# Patient Record
Sex: Male | Born: 1955 | Race: Black or African American | Hispanic: No | Marital: Married | Smoking: Former smoker
Health system: Southern US, Community
[De-identification: ages and names within clinical notes are randomized; demographics above are authoritative.]

## PROBLEM LIST (undated history)

## (undated) DIAGNOSIS — R319 Hematuria, unspecified: Secondary | ICD-10-CM

## (undated) DIAGNOSIS — Z720 Tobacco use: Secondary | ICD-10-CM

## (undated) DIAGNOSIS — I5022 Chronic systolic (congestive) heart failure: Secondary | ICD-10-CM

## (undated) DIAGNOSIS — E119 Type 2 diabetes mellitus without complications: Secondary | ICD-10-CM

## (undated) DIAGNOSIS — I2510 Atherosclerotic heart disease of native coronary artery without angina pectoris: Secondary | ICD-10-CM

## (undated) DIAGNOSIS — M259 Joint disorder, unspecified: Secondary | ICD-10-CM

## (undated) DIAGNOSIS — Z95810 Presence of automatic (implantable) cardiac defibrillator: Secondary | ICD-10-CM

## (undated) DIAGNOSIS — D696 Thrombocytopenia, unspecified: Secondary | ICD-10-CM

## (undated) DIAGNOSIS — N183 Chronic kidney disease, stage 3 unspecified: Secondary | ICD-10-CM

## (undated) DIAGNOSIS — M1990 Unspecified osteoarthritis, unspecified site: Secondary | ICD-10-CM

## (undated) DIAGNOSIS — E785 Hyperlipidemia, unspecified: Secondary | ICD-10-CM

## (undated) DIAGNOSIS — I447 Left bundle-branch block, unspecified: Secondary | ICD-10-CM

## (undated) DIAGNOSIS — I10 Essential (primary) hypertension: Secondary | ICD-10-CM

## (undated) DIAGNOSIS — I509 Heart failure, unspecified: Secondary | ICD-10-CM

## (undated) DIAGNOSIS — I639 Cerebral infarction, unspecified: Secondary | ICD-10-CM

## (undated) HISTORY — DX: Chronic kidney disease, stage 3 unspecified: N18.30

## (undated) HISTORY — PX: NO PAST SURGERIES: SHX2092

## (undated) HISTORY — DX: Atherosclerotic heart disease of native coronary artery without angina pectoris: I25.10

## (undated) HISTORY — DX: Chronic systolic (congestive) heart failure: I50.22

## (undated) HISTORY — DX: Left bundle-branch block, unspecified: I44.7

## (undated) HISTORY — DX: Cerebral infarction, unspecified: I63.9

## (undated) HISTORY — DX: Thrombocytopenia, unspecified: D69.6

---

## 2005-09-28 ENCOUNTER — Inpatient Hospital Stay (HOSPITAL_COMMUNITY): Admission: EM | Admit: 2005-09-28 | Discharge: 2005-10-01 | Payer: Self-pay | Admitting: Emergency Medicine

## 2006-03-05 ENCOUNTER — Emergency Department (HOSPITAL_COMMUNITY): Admission: EM | Admit: 2006-03-05 | Discharge: 2006-03-05 | Payer: Self-pay | Admitting: Emergency Medicine

## 2009-01-07 ENCOUNTER — Emergency Department (HOSPITAL_COMMUNITY): Admission: EM | Admit: 2009-01-07 | Discharge: 2009-01-07 | Payer: Self-pay | Admitting: Emergency Medicine

## 2009-08-25 ENCOUNTER — Ambulatory Visit: Payer: Self-pay | Admitting: Internal Medicine

## 2009-08-25 ENCOUNTER — Inpatient Hospital Stay (HOSPITAL_COMMUNITY): Admission: EM | Admit: 2009-08-25 | Discharge: 2009-08-28 | Payer: Self-pay | Admitting: Emergency Medicine

## 2009-08-26 ENCOUNTER — Encounter (INDEPENDENT_AMBULATORY_CARE_PROVIDER_SITE_OTHER): Payer: Self-pay | Admitting: Internal Medicine

## 2009-09-18 ENCOUNTER — Encounter (INDEPENDENT_AMBULATORY_CARE_PROVIDER_SITE_OTHER): Payer: Self-pay | Admitting: *Deleted

## 2010-04-22 NOTE — Letter (Signed)
Summary: Appointment - Missed  [facility], Main Office  [address]   [place], [place] [address]   Phone: [phone]  Fax: [phone]     September 18, 2009 MRN: [id number]   Steven Brady [address] [place], [place]  [address]   Dear [name],  Our records indicate you need to schedule a post hospital appointment and a myoview test as soon as possible with Dr. Excell Seltzer.  It is very important that we reach you to schedule this appointment. We look forward to participating in your health care needs. Please contact us at the number listed above at your earliest convenience to reschedule this appointment.     Sincerely, Neurosurgeon Team LG

## 2010-06-09 LAB — COMPREHENSIVE METABOLIC PANEL
ALT: 22 U/L (ref 0–53)
AST: 21 U/L (ref 0–37)
AST: 25 U/L (ref 0–37)
Albumin: 3.5 g/dL (ref 3.5–5.2)
Albumin: 4.1 g/dL (ref 3.5–5.2)
Alkaline Phosphatase: 63 U/L (ref 39–117)
Alkaline Phosphatase: 72 U/L (ref 39–117)
BUN: 16 mg/dL (ref 6–23)
GFR calc Af Amer: >60 mL/min (ref >60)
GFR calc non Af Amer: >60 mL/min (ref >60)
Potassium: 3.8 mEq/L (ref 3.5–5.1)
Sodium: 138 mEq/L (ref 135–145)
Sodium: 139 mEq/L (ref 135–145)
Total Bilirubin: 0.9 mg/dL (ref 0.3–1.2)
Total Protein: 7.2 g/dL (ref 6.0–8.3)
Total Protein: 7.6 g/dL (ref 6.0–8.3)

## 2010-06-09 LAB — CBC
HCT: 43.1 % (ref 39.0–52.0)
MCHC: 34.4 g/dL (ref 30.0–36.0)
Platelets: 100 10*3/uL — ABNORMAL LOW (ref 150–400)
Platelets: 105 10*3/uL — ABNORMAL LOW (ref 150–400)
Platelets: 77 10*3/uL — ABNORMAL LOW (ref 150–400)
RDW: 13.8 % (ref 11.5–15.5)
RDW: 14.2 % (ref 11.5–15.5)
RDW: 14.3 % (ref 11.5–15.5)
WBC: 10.1 10*3/uL (ref 4.0–10.5)

## 2010-06-09 LAB — BASIC METABOLIC PANEL
BUN: 11 mg/dL (ref 6–23)
BUN: 12 mg/dL (ref 6–23)
CO2: 26 mEq/L (ref 19–32)
CO2: 28 mEq/L (ref 19–32)
Calcium: 8.5 mg/dL (ref 8.4–10.5)
Chloride: 104 mEq/L (ref 96–112)
Creatinine, Ser: 1.28 mg/dL (ref 0.4–1.5)
GFR calc Af Amer: >60 mL/min (ref >60)
GFR calc non Af Amer: >60 mL/min (ref >60)
Glucose, Bld: 113 mg/dL — ABNORMAL HIGH (ref 70–99)
Potassium: 4.1 mEq/L (ref 3.5–5.1)
Sodium: 137 mEq/L (ref 135–145)

## 2010-06-09 LAB — CARDIAC PANEL(CRET KIN+CKTOT+MB+TROPI)
CK, MB: 2.4 ng/mL (ref 0.3–4.0)
CK, MB: 2.4 ng/mL (ref 0.3–4.0)
CK, MB: 2.7 ng/mL (ref 0.3–4.0)
Relative Index: 1.2 (ref 0.0–2.5)
Total CK: 198 U/L (ref 7–232)
Total CK: 215 U/L (ref 7–232)
Troponin I: 0.05 ng/mL (ref 0.00–0.06)

## 2010-06-09 LAB — PROTIME-INR: INR: 0.99 (ref 0.00–1.49)

## 2010-06-09 LAB — TSH
TSH: 0.353 u[IU]/mL (ref 0.350–4.500)
TSH: 1.726 u[IU]/mL (ref 0.350–4.500)

## 2010-06-09 LAB — URINALYSIS, ROUTINE W REFLEX MICROSCOPIC
Glucose, UA: NEGATIVE mg/dL
Hgb urine dipstick: NEGATIVE
Leukocytes, UA: NEGATIVE
Nitrite: NEGATIVE
Protein, ur: 30 mg/dL — AB
Specific Gravity, Urine: 1.015 (ref 1.005–1.030)

## 2010-06-09 LAB — DIFFERENTIAL
Basophils Relative: 1 % (ref 0–1)
Eosinophils Absolute: 0.1 10*3/uL (ref 0.0–0.7)
Monocytes Relative: 6 % (ref 3–12)
Neutrophils Relative %: 74 % (ref 43–77)

## 2010-06-09 LAB — LIPID PANEL
Cholesterol: 175 mg/dL (ref 0–200)
LDL Cholesterol: 115 mg/dL — ABNORMAL HIGH (ref 0–99)
Triglycerides: 68 mg/dL (ref <150)

## 2010-06-09 LAB — CK TOTAL AND CKMB (NOT AT ARMC)
CK, MB: 2.8 ng/mL (ref 0.3–4.0)
Relative Index: 0.9 (ref 0.0–2.5)

## 2010-06-09 LAB — T4, FREE: Free T4: 1.46 ng/dL (ref 0.80–1.80)

## 2010-06-09 LAB — D-DIMER, QUANTITATIVE: D-Dimer, Quant: 0.36 ug/mL-FEU (ref 0.00–0.48)

## 2010-08-08 NOTE — Cardiovascular Report (Signed)
NAMEMarland Kitchen  Steven Brady, Steven Brady NO.:  [id number]   MEDICAL RECORD NO.:  [id number]          PATIENT TYPE:  INP   LOCATION:  2905                         FACILITY:  [facility]   PHYSICIAN:  Cristy Hilts. Jacinto Halim, MD       DATE OF BIRTH:  Apr 29, 1955   DATE OF PROCEDURE:  09/29/2005  DATE OF DISCHARGE:                              CARDIAC CATHETERIZATION   ATTENDING CARDIOLOGIST:  Dr. Chanda Busing   PROCEDURES PERFORMED:  1. Left ventriculography.  2. Selective right and left coronary arteriography.  3. Ascending aortogram.  4. Abdominal aortogram.   INDICATIONS:  [name] is a 55 year old gentleman with a history  of hypertension, smoking who was admitted to the hospital with chest pain  suggestive of unstable angina.  He also had abnormal EKG.  Given this he was  brought directly to the cardiac catheterization laboratory to evaluate his  coronary anatomy.  Ascending aortogram was performed to evaluate for aortic  dissection and aortic root dilatation given his history of chest pain and  hypertension and abdominal aortogram was performed to evaluate for renal  artery stenosis and abdominal atherosclerosis.   HEMODYNAMIC DATA:  The left ventricular pressures were 137/2 with end-  diastolic pressure of 6 mmHg.  The aortic pressures were 137/84 with a mean  of 106 mmHg.  There was no pressure gradient across the aortic valve.   ANGIOGRAPHIC DATA:  Left ventricle:  Left ventricular systolic function was  normal with ejection fraction of 60%.  There was no wall motion abnormality.  There was no significant mitral regurgitation.   Right coronary artery:  Right coronary artery is a large caliber vessel and  a codominant vessel with circumflex coronary artery.  It has mild ectasia  with mild luminal irregularity.   Left main:  Left main is a large caliber vessel.  It is long and smooth.   Circumflex:  Circumflex is codominant with right coronary artery.  It gives  origin  to a moderate to large sized OM1 which has secondary branches and the  secondary branch has diffuse disease.  It continues in the AV groove and  gives origin to a small PDA branch.  Again, circumflex has mild luminal  irregularity.   LAD:  Left anterior descending is a large caliber vessel.  Gives origin to a  very large septal perforator.  The LAD also gives origin to a small diagonal  1 and a moderate to large sized diagonal 2 in its mid segment.  The LAD has  mild luminal irregularity.  It wraps around the apex.   ASCENDING AORTOGRAM:  Ascending aortogram revealed presence of three aortic  valve cusps, no evidence of ascending aortic aneurysm or aortic  regurgitation.   ABDOMINAL AORTOGRAM:  Abdominal aortogram revealed presence of two renal  arteries, one on either side.  They are widely patent.  The aortic iliac  bifurcation was widely patent.  There was no evidence of abdominal aortic  aneurysm.   IMPRESSION:  1. Mild to moderate diffuse disease of the right coronary artery and the      codominant circumflex coronary artery.  There is mild ectasia noted,      especially in the right coronary artery.   The flow in the right coronary artery improved with 200 mcg intracoronary  nitroglycerin administration suggesting endothelial dysfunction.   RECOMMENDATIONS:  Statins, blood pressure control, and smoking cessation is  indicated.  Primary prevention strategy is indicated.  He will also be  discharged home on aspirin.  He will follow up with Dr. Elsie Lincoln in two weeks.   A total of 120 mL of contrast was utilized for diagnostic angiography.  The  access was closed with StarClose.   TECHNIQUE OF PROCEDURE:  Under usual sterile precautions using a 6-French  right femoral arterial access, 6-French multipurpose BD catheter was  advanced to the ascending aorta with 0.035 [place] J-wire.  The catheter was  gently advanced to the left ventricle.  Left ventricular pressures were   monitored.  Hand contrast into the left ventricle was performed both in LAO  and RAO projection.  Catheter was flushed with saline and pulled back into  the ascending aorta and pressure gradient across the aortic valve was  monitored.  Right coronary artery was selectively engaged and angiography  was performed.  200 mcg intracoronary nitroglycerin was also administered.  Then the left main coronary artery was selectively engaged and angiography  was performed.  Then the catheter was pulled back into the root of the aorta  and ascending aortogram was performed.  Then the catheter was pulled back  into the abdominal aorta and abdominal aortogram was performed.  The  catheter was then pulled out of the body and right femoral angiography was  performed through the arterial access sheath and the access was closed with  StarClose with excellent hemostasis.  Patient tolerated procedure.  No  immediate complication noted.      Cristy Hilts. Jacinto Halim, MD  Electronically Signed     JRG/MEDQ  D:  09/29/2005  T:  09/29/2005  Job:  [id number]

## 2010-08-08 NOTE — Discharge Summary (Signed)
NAMEMarland Kitchen  CHRISS, MANNAN NO.:  [id number]   MEDICAL RECORD NO.:  [id number]          PATIENT TYPE:  INP   LOCATION:  6710                         FACILITY:  [facility]   PHYSICIAN:  Darcella Gasman. [place], N.P.  DATE OF BIRTH:  [birth year]   DATE OF ADMISSION:  09/28/2005  DATE OF DISCHARGE:  10/01/2005                                 DISCHARGE SUMMARY   DISCHARGE DIAGNOSES:  1.  Chest pain, negative myocardial infarction.  2.  Mild coronary artery disease per cardiac catheterization.  3.  Uncontrolled hypertension now controlled.  4.  Tobacco abuse, discussed.  5.  Dyslipidemia.  6.  Thrombocytopenia.   DISCHARGE CONDITION:  Improved.   PROCEDURES:  On September 29, 2005 heart catheterization by Cristy Hilts. Jacinto Halim, MD,  Ejection fraction 60%.   DISCHARGE MEDICATIONS:  1.  Lisinopril/HCT 20/12.5 one a day.  2.  Caduet 1010 once a day.  3.  Coreg 25 mg twice a day.  4.  Coated aspirin daily.   DISCHARGE INSTRUCTIONS:  1.  Return to work Monday, October 05, 2005.  2.  Low fat, low cholesterol diet.  3.  Followup with Madaline Savage, M.D., October 13, 2005 at 9:45 a.m.  Our      office number is [phone].  4.  No smoking.   HISTORY OF PRESENT ILLNESS:  A 50-uyear-old African American without prior  history of chest pain or coronary artery disease presented to the ER with  chest pain on September 28, 2005.  He was at work when the chest pain began.  He  was at work when the chest pain began.  He had severe headache, dizziness,  chest pain, shortness of breath and pressure like a weight on his chest.  Also pain radiating to the left shoulder and arms and he felt numbness in  the left hand a fingertip.  On arrival to the ED, the patient's systolic  pressure was greater than 200.   He was admitted and placed on IV nitroglycerin and given lisinopril and beta  blocker and plans for cardiac catheterization on September 29, 2005.   PAST MEDICAL HISTORY:  A 10 year history of hypertension but not on  any  medications.   FAMILY HISTORY:  Positive for hypertension.  His father has glaucoma and  also his mother has Alzheimer's disease.   SOCIAL HISTORY:  Just recently married with 2 children.  Smokes 1/2 pack per  day for 20 years.  Occasionally drinks alcohol.   ALLERGIES:  NO KNOWN DRUG ALLERGIES.   REVIEW OF SYSTEMS:  See H and P.   PHYSICAL EXAMINATION:  Blood pressure 168/89, pulse 62, respirations 18,  temperature 97.9, oxygen saturation on room air 97%.  Heart: Regular rate and rhythm. No murmur.  Lungs: Clear.  Abdomen: Soft, nontender.  Extremities: Without edema.   LABORATORY DATA:  Hemoglobin 15.2, hematocrit 44, WBC 5.6, platelets 130.  Neutrophils 52, lymphs 38, monos 7, eosinophils 2, basophils 1.  There were  large platelets present.   Prothrombin time 4.7, INR 0.9, PTT 32.   Chemistries: Sodium 141, potassium 3.8, chloride 109, CO2 25, glucose  82,  BUN 9, creatinine 0.9, calcium 8.7, total protein 5.8, albumin 3.4, AST 20,  ALT 15, ALP 53, total bilirubin 0.9.  These remained stable.  Potassium ran  low in the hospital.  Glucose essentially was normal.  It did bump a little  on D5 fluids.  Cardiac enzymes CK 182, 168, MB 152, creatinine 1.7.  Troponin I 0.04 to 0.03.  Total cholesterol 185, triglycerides 34, HDL 44,  LDL 129.  TSH 0.770.   EKG revealed sinus bradycardia, moderate voltage criteria for LVH, consider  inferolateral ischemia.  Does have LVH.   FOLLOWUP:  Remains the same.  Chest x-ray on admission no acute findings.   HOSPITAL COURSE:  Mr. Melichar was admitted by Dr. Elsie Lincoln on September 28, 2005  with chest pain, uncontrolled hypertension.  He was admitted, placed on IV  nitroglycerin drip and cardiac enzymes were done.  Also was started on beta  blocker.  He underwent cardiac catheterization on September 29, 2005.  His  enzymes were all negative for MI.  He has diffuse coronary disease but  noncritical and nonobstructive.  EF was 60%.   The patient  did well.  He was not discharged until October 01, 2005 to get  better control of his blood pressure and numerous blood pressure  adjustments.  By October 01, 2005 pressure was much better controlled at  158/89, down from the day previous of 182/112, and 158/102.  He was stable  and was ready for discharge home.  He was seen and examined by Dr. Jacinto Halim and  discharged.  He will followup with Dr. Elsie Lincoln.      Darcella Gasman. Annie Paras, N.P.     LRI/MEDQ  D:  10/01/2005  T:  10/02/2005  Job:  [id number]

## 2011-06-08 ENCOUNTER — Other Ambulatory Visit: Payer: Self-pay

## 2011-06-08 ENCOUNTER — Emergency Department (HOSPITAL_COMMUNITY): Payer: Self-pay

## 2011-06-08 ENCOUNTER — Observation Stay (HOSPITAL_COMMUNITY)
Admission: EM | Admit: 2011-06-08 | Discharge: 2011-06-09 | Disposition: A | Discharge status: Home / Self Care | Payer: 59 | Attending: Cardiology | Admitting: Cardiology

## 2011-06-08 ENCOUNTER — Encounter (HOSPITAL_COMMUNITY): Payer: Self-pay | Admitting: *Deleted

## 2011-06-08 DIAGNOSIS — E785 Hyperlipidemia, unspecified: Secondary | ICD-10-CM | POA: Insufficient documentation

## 2011-06-08 DIAGNOSIS — Z9119 Patient's noncompliance with other medical treatment and regimen: Secondary | ICD-10-CM | POA: Insufficient documentation

## 2011-06-08 DIAGNOSIS — I10 Essential (primary) hypertension: Secondary | ICD-10-CM | POA: Insufficient documentation

## 2011-06-08 DIAGNOSIS — F17200 Nicotine dependence, unspecified, uncomplicated: Secondary | ICD-10-CM | POA: Insufficient documentation

## 2011-06-08 DIAGNOSIS — R45851 Suicidal ideations: Secondary | ICD-10-CM

## 2011-06-08 DIAGNOSIS — R079 Chest pain, unspecified: Secondary | ICD-10-CM

## 2011-06-08 DIAGNOSIS — R319 Hematuria, unspecified: Secondary | ICD-10-CM

## 2011-06-08 DIAGNOSIS — Z91199 Patient's noncompliance with other medical treatment and regimen due to unspecified reason: Secondary | ICD-10-CM | POA: Insufficient documentation

## 2011-06-08 DIAGNOSIS — Z720 Tobacco use: Secondary | ICD-10-CM | POA: Diagnosis present

## 2011-06-08 DIAGNOSIS — R42 Dizziness and giddiness: Secondary | ICD-10-CM

## 2011-06-08 DIAGNOSIS — F3289 Other specified depressive episodes: Secondary | ICD-10-CM | POA: Insufficient documentation

## 2011-06-08 DIAGNOSIS — M259 Joint disorder, unspecified: Secondary | ICD-10-CM

## 2011-06-08 DIAGNOSIS — F329 Major depressive disorder, single episode, unspecified: Secondary | ICD-10-CM

## 2011-06-08 DIAGNOSIS — R0789 Other chest pain: Principal | ICD-10-CM | POA: Insufficient documentation

## 2011-06-08 DIAGNOSIS — I169 Hypertensive crisis, unspecified: Secondary | ICD-10-CM

## 2011-06-08 HISTORY — DX: Tobacco use: Z72.0

## 2011-06-08 HISTORY — DX: Joint disorder, unspecified: M25.9

## 2011-06-08 HISTORY — DX: Hematuria, unspecified: R31.9

## 2011-06-08 HISTORY — DX: Hyperlipidemia, unspecified: E78.5

## 2011-06-08 HISTORY — DX: Essential (primary) hypertension: I10

## 2011-06-08 LAB — POCT I-STAT TROPONIN I: Troponin i, poc: 0.03 ng/mL (ref 0.00–0.08)

## 2011-06-08 LAB — COMPREHENSIVE METABOLIC PANEL
BUN: 13 mg/dL (ref 6–23)
Calcium: 9.7 mg/dL (ref 8.4–10.5)
GFR calc Af Amer: 87 mL/min — ABNORMAL LOW (ref >90)
Glucose, Bld: 94 mg/dL (ref 70–99)
Total Protein: 7.9 g/dL (ref 6.0–8.3)

## 2011-06-08 LAB — CBC
MCH: 30.2 pg (ref 26.0–34.0)
MCHC: 34.9 g/dL (ref 30.0–36.0)
MCV: 86.8 fL (ref 78.0–100.0)
Platelets: 160 10*3/uL (ref 150–400)
RBC: 5.29 MIL/uL (ref 4.22–5.81)

## 2011-06-08 LAB — DIFFERENTIAL
Eosinophils Absolute: 0.1 10*3/uL (ref 0.0–0.7)
Eosinophils Relative: 1 % (ref 0–5)
Lymphs Abs: 2.2 10*3/uL (ref 0.7–4.0)
Monocytes Relative: 6 % (ref 3–12)

## 2011-06-08 LAB — CARDIAC PANEL(CRET KIN+CKTOT+MB+TROPI)
CK, MB: 2.4 ng/mL (ref 0.3–4.0)
Relative Index: 2.2 (ref 0.0–2.5)
Total CK: 111 U/L (ref 7–232)
Troponin I: <0.3 ng/mL (ref <0.30)

## 2011-06-08 MED ORDER — SIMVASTATIN 20 MG PO TABS
20.0000 mg | ORAL_TABLET | Freq: Every day | ORAL | Status: DC
  Administered 2011-06-08 – 2011-06-09 (×2): 20 mg via ORAL
  Filled 2011-06-08 (×3): qty 1

## 2011-06-08 MED ORDER — ZOLPIDEM TARTRATE 10 MG PO TABS: 10.0000 mg | ORAL_TABLET | Freq: Every evening | ORAL | Status: DC | PRN

## 2011-06-08 MED ORDER — NITROGLYCERIN IN D5W 200-5 MCG/ML-% IV SOLN
5.0000 ug/min | Freq: Once | INTRAVENOUS | Status: AC
  Administered 2011-06-08: 5 ug/min via INTRAVENOUS
  Filled 2011-06-08: qty 250

## 2011-06-08 MED ORDER — NITROGLYCERIN 0.4 MG SL SUBL: 0.4000 mg | SUBLINGUAL_TABLET | SUBLINGUAL | Status: DC | PRN

## 2011-06-08 MED ORDER — ASPIRIN 81 MG PO CHEW
324.0000 mg | CHEWABLE_TABLET | Freq: Once | ORAL | Status: AC
  Administered 2011-06-08: 324 mg via ORAL
  Filled 2011-06-08: qty 4

## 2011-06-08 MED ORDER — ONDANSETRON HCL 4 MG/2ML IJ SOLN: 4.0000 mg | Freq: Four times a day (QID) | INTRAMUSCULAR | Status: DC | PRN

## 2011-06-08 MED ORDER — LISINOPRIL 10 MG PO TABS
10.0000 mg | ORAL_TABLET | Freq: Every day | ORAL | Status: DC
  Administered 2011-06-08 – 2011-06-09 (×2): 10 mg via ORAL
  Filled 2011-06-08 (×3): qty 1

## 2011-06-08 MED ORDER — CITALOPRAM HYDROBROMIDE 10 MG/5ML PO SOLN: 20.0000 mg | Freq: Every day | ORAL | Status: DC

## 2011-06-08 MED ORDER — SODIUM CHLORIDE 0.9 % IV SOLN
Freq: Once | INTRAVENOUS | Status: AC
  Administered 2011-06-08: 125 mL via INTRAVENOUS

## 2011-06-08 MED ORDER — METOPROLOL TARTRATE 25 MG PO TABS
25.0000 mg | ORAL_TABLET | Freq: Two times a day (BID) | ORAL | Status: DC
  Administered 2011-06-08 – 2011-06-09 (×3): 25 mg via ORAL
  Filled 2011-06-08 (×5): qty 1

## 2011-06-08 MED ORDER — CITALOPRAM HYDROBROMIDE 20 MG PO TABS
20.0000 mg | ORAL_TABLET | Freq: Every day | ORAL | Status: DC
  Filled 2011-06-08 (×2): qty 1

## 2011-06-08 MED ORDER — ASPIRIN EC 81 MG PO TBEC
81.0000 mg | DELAYED_RELEASE_TABLET | Freq: Every day | ORAL | Status: DC
  Administered 2011-06-09: 81 mg via ORAL
  Filled 2011-06-08 (×2): qty 1

## 2011-06-08 MED ORDER — ACETAMINOPHEN 325 MG PO TABS
650.0000 mg | ORAL_TABLET | ORAL | Status: DC | PRN
  Administered 2011-06-08 – 2011-06-09 (×2): 650 mg via ORAL
  Filled 2011-06-08 (×2): qty 2

## 2011-06-08 MED ORDER — NITROGLYCERIN IN D5W 200-5 MCG/ML-% IV SOLN: 10.0000 ug/min | INTRAVENOUS | Status: DC

## 2011-06-08 MED ORDER — ASPIRIN 81 MG PO CHEW
CHEWABLE_TABLET | ORAL | Status: AC
  Filled 2011-06-08: qty 3

## 2011-06-08 MED ORDER — SODIUM CHLORIDE 0.9 % IV SOLN
INTRAVENOUS | Status: DC
  Administered 2011-06-09: 06:00:00 via INTRAVENOUS

## 2011-06-08 MED ORDER — MORPHINE SULFATE 4 MG/ML IJ SOLN
4.0000 mg | Freq: Once | INTRAMUSCULAR | Status: AC
  Administered 2011-06-08: 4 mg via INTRAVENOUS
  Filled 2011-06-08: qty 1

## 2011-06-08 NOTE — Progress Notes (Signed)
Spiritual Care Note:  Paged at 1700 for another patient in ED and upon arriving found that the pt had died and the family had departed. I was directed to see this patient. Pt needed to unload his stress and worries. He admits he has thoughts of suicide but have dismissed them because he sees such a course to be cowardly and selfish. After a lengthy discussion pt said he felt better. I recommended in addition to any medical care given him to relieve his stress that he also consider meditation to relief his spiritual and mental stress. Pt is a Sales promotion account executive and this meditation could take the form of focusing on his playing or listening to music he knows will lift his spirit. Stress is coming from his family situation and a possible divorce in the near future.   Recommend follow up by chaplains as the pt wishes.  Benjie Karvonen. Dalayza Zambrana, DMin Chaplain

## 2011-06-08 NOTE — ED Notes (Signed)
Cardiologist at bedside.  

## 2011-06-08 NOTE — ED Notes (Signed)
Chaplin at bedside

## 2011-06-08 NOTE — ED Provider Notes (Signed)
History     CSN: [id number]  Arrival date & time 06/08/11  1409   First MD Initiated Contact with Patient 06/08/11 1501      Chief Complaint  Patient presents with  . Chest Pain  . Hypertension    (Consider location/radiation/quality/duration/timing/severity/associated sxs/prior treatment) HPI  56yoM h/o hyperlipidemia, noncompliant with medications presents with chest pain. The patient states that he has had exertional chest pressure and sharp pressure radiating to his left arm and left neck, back intermittently over the past 2-3 days. He complains of mild shortness of breath and nausea with chest pressure. He states that his chest pain is 10 out of 10 at this time. He reports lightheadedness and left-sided frontal headache. There is no change in his vision. No photo phonophobia. There is no neck stiffness. He states that his roommate total he felt warm this morning. There was diaphoresis. He denies cough, recent sick contacts. No known h/o AMI. Denies h/o VTE in self or family. No recent hosp/surg/immob. No h/o cancer. Denies exogenous hormone use, no leg pain or swelling  Ho HTN, HLD, +smoker No Fmhx early CAD  ED Notes, ED Provider Notes from 06/08/11 0000 to 06/08/11 14:19:12       Myna Hidalgo, RN 06/08/2011 14:17      Pt reports L side cp that started this am while laying down. Pt reports SOB and nausea with the cp. Pt's BP is elevated-not taking meds. Pt also reports dizziness and lightheadedness    Past Medical History  Diagnosis Date  . Hypertension   . 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    recently hurt left ankle - 2 weeks ago  . Hematuria - cause not known 06/08/11    pt has been seeing blood off and on in urine  . Hyperlipidemia   . Tobacco abuse     Past Surgical History  Procedure Date  . No past surgeries     History reviewed. No pertinent family history.  History  Substance Use Topics  . Smoking status: Current Everyday Smoker -- 2.0 packs/day    Types:  Cigarettes  . Smokeless tobacco: Never Used  . Alcohol Use: No    Review of Systems  All other systems reviewed and are negative.  except as noted HPI   Allergies  Review of patient's allergies indicates no known allergies.  Home Medications  No current outpatient prescriptions on file.  BP 194/125  Pulse 68  Temp(Src) 99 F (37.2 C) (Oral)  Resp 18  SpO2 98%  Physical Exam  Nursing note and vitals reviewed. Constitutional: He is oriented to person, place, and time. He appears well-developed and well-nourished. No distress.  HENT:  Head: Atraumatic.  Mouth/Throat: Oropharynx is clear and moist.  Eyes: Conjunctivae are normal. Pupils are equal, round, and reactive to light.  Neck: Neck supple.  Cardiovascular: Normal rate, regular rhythm, normal heart sounds and intact distal pulses.  Exam reveals no gallop and no friction rub.   No murmur heard. Pulmonary/Chest: Effort normal. No respiratory distress. He has no wheezes. He has no rales. He exhibits tenderness.       +Lt cw ttp  Abdominal: Soft. Bowel sounds are normal. There is no tenderness. There is no rebound and no guarding.  Musculoskeletal: Normal range of motion. He exhibits no edema and no tenderness.  Neurological: He is alert and oriented to person, place, and time.  Skin: Skin is warm and dry.  Psychiatric: He has a normal mood and affect.    Date:  06/08/2011  Rate: 68  Rhythm: normal sinus rhythm  QRS Axis: normal  Intervals: normal  ST/T Wave abnormalities: t wave inversions inferolateral leads  Conduction Disutrbances:none  Narrative Interpretation:   Old EKG Reviewed: none available   ED Course  Procedures (including critical care time)  Labs Reviewed  COMPREHENSIVE METABOLIC PANEL - Abnormal; Notable for the following:    GFR calc non Af Amer 75 (*)    GFR calc Af Amer 87 (*)    All other components within normal limits  CBC  DIFFERENTIAL  POCT I-STAT TROPONIN I   Dg Chest 2  View  06/08/2011  *RADIOLOGY REPORT*  Clinical Data: Left-sided chest pain.  Hypertension.  Smoking history.  CHEST - 2 VIEW  Comparison: 08/25/2009  Findings: Artifact overlies the chest.  Heart size is normal.  The aorta is unfolded.  The pulmonary vascularity is normal.  Lungs are clear.  No effusions.  No significant bony findings.  IMPRESSION: No active disease.  Original Report Authenticated By: Thomasenia Sales, M.D.     1. Chest pain   2. Hypertensive crisis   3. Dizziness   4. Depression   5. Suicidal ideation     MDM   Presents with Chest pain, hypertensive crisis. EKG with T-wave inversions inferior lateral leads. There is no old for comparison. Initial troponin which is point-of-care ordered prior to my arrival was negative. The patient was started on nitroglycerin drip for blood pressure control and chest pain. He is given morphine, chewable aspirin.  On my reassessment his blood pressure remains elevated. He also now states that he is very depressed secondary to social stressors. He states he's had intermittent suicidal ideation without a plan. This may be playing a role in his chest pain. He also has some reproducible cw ttp.  [facility] cardiology is in the emergency department to evaluate the patient. They will control his blood pressure, formal rule out and possible stress test. The patient will eventually need evaluation for suicidal ideation during his stay in the hospital.       Forbes Cellar, MD 06/08/11 1727

## 2011-06-08 NOTE — ED Notes (Signed)
[id number]<AV> Expected date:06/08/11<BR> Expected time: 2:14 PM<BR> Means of arrival:<BR> Comments:<BR> Hold for CP pt in TR 1

## 2011-06-08 NOTE — ED Notes (Signed)
Chaplin in room.  

## 2011-06-08 NOTE — ED Notes (Signed)
Pt to xray at this time.

## 2011-06-08 NOTE — ED Notes (Signed)
Pt returned to room, vital signs being obtained, pt placed back on monitor

## 2011-06-08 NOTE — ED Notes (Signed)
Pt reports L side cp that started this am while laying down.  Pt reports SOB and nausea with the cp.  Pt's BP is elevated-not taking meds.  Pt also reports dizziness and lightheadedness

## 2011-06-08 NOTE — ED Notes (Signed)
Pt had expressed SI thoughts to the Triage RN. Verbally said "I don't really care about life anymore" to this RN in the room

## 2011-06-08 NOTE — H&P (Signed)
Patient ID: Steven Brady MRN: [id number] DOB/AGE: 27-Oct-1955 56 y.o. Admit date: 06/08/2011  Primary Care Physician: None Primary Cardiologist: New  HPI:  56 yo AAM with history of tobacco abuse, HTN, hyperlipidemia and medication non-compliance who is admitted today after presenting to the [facility] ED with c/o chest pain occurring for the last year but with more frequent episodes over the last few days. He describes the chest pain as central sharp pains, radiating to his neck, left arm and back. This mainly occurs at rest and tends to be worsened by deep inspiration. He has a h/o HTN but has not been taking his anti-hypertensive meds for at least a year. He has been smoking 2ppd for at least 30 years. He has no prior cardiac history and no family history of CAD. He does not used cocaine or heroin. He does use marijuana. He has been under much stress at home and has been depressed. His BP on admission is 225/125. EKG with T wave inversion inferior and anterolateral leads with T wave flattening lateral leads, LVH, NSR. No active chest pain at this time.   Review of systems complete and found to be negative unless listed above.  Past Medical History  Diagnosis Date  . Hypertension   . 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    recently hurt left ankle - 2 weeks ago  . Hematuria - cause not known 06/08/11    pt has been seeing blood off and on in urine  . Hyperlipidemia   . Tobacco abuse     Family History  Problem Relation Age of Onset  . Dementia Mother      History   Social History  . Marital Status: Married    Spouse Name: N/A    Number of Children: N/A  . Years of Education: N/A   Occupational History  . Not on file.   Social History Main Topics  . Smoking status: Current Everyday Smoker -- 2.0 packs/day    Types: Cigarettes  . Smokeless tobacco: Never Used  . Alcohol Use: No  . Drug Use: Yes    Special: Marijuana  . Sexually Active:    Other Topics Concern  . Not  on file   Social History Narrative  . No narrative on file    Past Surgical History  Procedure Date  . No past surgeries     No Known Allergies  Prior to Admission Meds: None  Physical Exam: Blood pressure 194/125, pulse 68, temperature 99 F (37.2 C), temperature source Oral, resp. rate 18, SpO2 98.00%.  General: Well developed, well nourished, NAD  HEENT: OP clear, mucus membranes moist  SKIN: warm, dry. No rashes.  Neuro: No focal deficits  Musculoskeletal: Muscle strength 5/5 all ext  Psychiatric: Mood and affect normal  Neck: No JVD, no carotid bruits, no thyromegaly, no lymphadenopathy.  Lungs:Clear bilaterally, no wheezes, rhonci, crackles  Cardiovascular: Regular rate and rhythm. No murmurs, gallops or rubs.  Abdomen:Soft. Bowel sounds present. Non-tender.  Extremities: No lower extremity edema. Pulses are 2 + in the bilateral DP/PT.   Labs:   Lab Results  Component Value Date   WBC 6.4 06/08/2011   HGB 16.0 06/08/2011   HCT 45.9 06/08/2011   MCV 86.8 06/08/2011   PLT 160 06/08/2011    Lab 06/08/11 1448  NA 137  K 3.9  CL 101  CO2 27  BUN 13  CREATININE 1.09  CALCIUM 9.7  PROT 7.9  BILITOT 0.7  ALKPHOS 88  ALT 9  AST 17  GLUCOSE 94   Lab Results  Component Value Date   CKTOTAL 198 08/27/2009   CKMB 2.4 08/27/2009   TROPONINI  Value: 0.02        NO INDICATION OF MYOCARDIAL INJURY. 08/27/2009    Lab Results  Component Value Date   CHOL  Value: 175        ATP III CLASSIFICATION:  <200     mg/dL   Desirable  629-528  mg/dL   Borderline High  >=413    mg/dL   High        04/26/4008   Lab Results  Component Value Date   HDL 46 08/26/2009   Lab Results  Component Value Date   LDLCALC  Value: 115        Total Cholesterol/HDL:CHD Risk Coronary Heart Disease Risk Table                     Men   Women  1/2 Average Risk   3.4   3.3  Average Risk       5.0   4.4  2 X Average Risk   9.6   7.1  3 X Average Risk  23.4   11.0        Use the calculated Patient Ratio above  and the CHD Risk Table to determine the patient's CHD Risk.        ATP III CLASSIFICATION (LDL):  <100     mg/dL   Optimal  272-536  mg/dL   Near or Above                    Optimal  130-159  mg/dL   Borderline  644-034  mg/dL   High  >742     mg/dL   Very High* 07/29/5636   Lab Results  Component Value Date   TRIG 68 08/26/2009   Lab Results  Component Value Date   CHOLHDL 3.8 08/26/2009   No results found for this basename: LDLDIRECT      Radiology: No acute disease.   EKG: NSR, T wave inversion inferior and anterolateral leads. T wave flattening lateral leads. LVH.   ASSESSMENT AND PLAN:   1. Chest pain: This patient presents with atypical chest pain, worsened with inspiration. EKG shows LVH and non-specific changes. Will admit to telemetry unit. Continue NTG drip for BP control. Will increase to 10 mcg/min. Will start beta blocker and Ace-inh, ASA. Cycle cardiac enzymes. Echo in am. If rules out for MI, will need stress myoview which could be set up as an outpatient.   2. HTN: Uncontrolled. Continue NTG drip. Will start Lopressor and Lisinopril.   3. Depression: Will start Celexa. He currently has no suicidal ideation. Will reassess in am.   4. Tobacco abuse: Complete tobacco cessation is recommended.    [name] 06/08/2011, 5:05 PM

## 2011-06-09 ENCOUNTER — Other Ambulatory Visit: Payer: Self-pay

## 2011-06-09 DIAGNOSIS — R079 Chest pain, unspecified: Secondary | ICD-10-CM | POA: Diagnosis present

## 2011-06-09 DIAGNOSIS — I10 Essential (primary) hypertension: Secondary | ICD-10-CM | POA: Diagnosis present

## 2011-06-09 DIAGNOSIS — Z720 Tobacco use: Secondary | ICD-10-CM | POA: Diagnosis present

## 2011-06-09 DIAGNOSIS — R072 Precordial pain: Secondary | ICD-10-CM

## 2011-06-09 DIAGNOSIS — E785 Hyperlipidemia, unspecified: Secondary | ICD-10-CM | POA: Diagnosis present

## 2011-06-09 LAB — LIPID PANEL
Cholesterol: 164 mg/dL (ref 0–200)
HDL: 43 mg/dL (ref >39)

## 2011-06-09 LAB — BASIC METABOLIC PANEL
BUN: 14 mg/dL (ref 6–23)
CO2: 24 mEq/L (ref 19–32)
Calcium: 8.3 mg/dL — ABNORMAL LOW (ref 8.4–10.5)
Chloride: 105 mEq/L (ref 96–112)
Creatinine, Ser: 1.06 mg/dL (ref 0.50–1.35)
GFR calc Af Amer: >90 mL/min (ref >90)

## 2011-06-09 LAB — CBC
HCT: 37.2 % — ABNORMAL LOW (ref 39.0–52.0)
MCH: 29.7 pg (ref 26.0–34.0)
MCV: 86.9 fL (ref 78.0–100.0)
RDW: 13.3 % (ref 11.5–15.5)
WBC: 4.8 10*3/uL (ref 4.0–10.5)

## 2011-06-09 LAB — CARDIAC PANEL(CRET KIN+CKTOT+MB+TROPI)
Relative Index: 2.5 (ref 0.0–2.5)
Relative Index: 1.6 (ref 0.0–2.5)
Total CK: 103 U/L (ref 7–232)
Troponin I: <0.3 ng/mL (ref <0.30)

## 2011-06-09 MED ORDER — CITALOPRAM HYDROBROMIDE 20 MG PO TABS: 20.0000 mg | ORAL_TABLET | Freq: Every day | ORAL | Status: AC

## 2011-06-09 MED ORDER — LISINOPRIL 10 MG PO TABS: 10.0000 mg | ORAL_TABLET | Freq: Every day | ORAL | Status: DC

## 2011-06-09 MED ORDER — METOPROLOL TARTRATE 25 MG PO TABS: 25.0000 mg | ORAL_TABLET | Freq: Two times a day (BID) | ORAL | Status: AC

## 2011-06-09 MED ORDER — SIMVASTATIN 20 MG PO TABS: 20.0000 mg | ORAL_TABLET | Freq: Every day | ORAL | Status: AC

## 2011-06-09 NOTE — Discharge Instructions (Addendum)
You need to obtain a Primary Care MD. We will give you a 41-month supply of your meds with 1 refill but you need to get  a Primary MD for long-term prescriptions and follow up.  NO Tobacco NO Drugs NO AlcoholAspirin and Your Heart Aspirin affects the way your blood clots and helps "thin" the blood. Aspirin has many uses in heart disease. It may be used as a primary prevention to help reduce the risk of heart related events. It also can be used as a secondary measure to prevent more heart attacks or to prevent additional damage from blood clots.  ASPIRIN MAY HELP IF YOU:  Have had a heart attack or chest pain.   Have undergone open heart surgery such as CABG (Coronary Artery Bypass Surgery).   Have had coronary angioplasty with or without stents.   Have experienced a stroke or TIA (transient ischemic attack).   Have peripheral vascular disease (PAD).   Have chronic heart rhythm problems such as atrial fibrillation.   Are at risk for heart disease.  BEFORE STARTING ASPIRIN Before you start taking aspirin, your caregiver will need to review your medical history. Many things will need to be taken into consideration, such as:  Smoking status.   Blood pressure.   Diabetes.   Gender.   Weight.   Cholesterol level.  ASPIRIN DOSES  Aspirin should only be taken on the advice of your caregiver. Talk to your caregiver about how much aspirin you should take. Aspirin comes in different doses such as:   81 mg.   162 mg.   325 mg.   The aspirin dose you take may be affected by many factors, some of which include:   Your current medications, especially if your are taking blood-thinners or anti-platelet medicine.   Liver function.   Heart disease risk.   Age.   Aspirin comes in two forms:   Non-enteric-coated. This type of aspirin does not have a coating and is absorbed faster. Non-enteric coated aspirin is recommended for patients experiencing chest pain symptoms. This type of  aspirin also comes in a chewable form.   Enteric-coated. This means the aspirin has a special coating that releases the medicine very slowly. Enteric-coated aspirin causes less stomach upset. This type of aspirin should not be chewed or crushed.  ASPIRIN SIDE EFFECTS Daily use of aspirin can increase your risk of serious side effects, some of these include:  Increased bleeding. This can range from a cut that does not stop bleeding to more serious problems such as stomach bleeding or bleeding into the brain (Intracerebral bleeding).   Increased bruising.   Stomach upset.   An allergic reaction such as red, itchy skin.   Increased risk of bleeding when combined with non-steroidal anti-inflammatory medicine (NSAIDS).   Alcohol should be drank in moderation when taking aspirin. Alcohol can increase the risk of stomach bleeding when taken with aspirin.   Aspirin should not be given to children less than 32 years of age due to the association of Reye syndrome. Reye syndrome is a serious illness that can affect the brain and liver. Studies have linked Reye syndrome with aspirin use in children.   People that have nasal polyps have an increased risk of developing an aspirin allergy.  SEEK MEDICAL CARE IF:   You develop an allergic reaction such as:   Hives.   Itchy skin.   Swelling of the lips, tongue or face.   You develop stomach pain.   You have unusual  bleeding or bruising.   You have ringing in your ears.  SEEK IMMEDIATE MEDICAL CARE IF:   You have severe chest pain, especially if the pain is crushing or pressure-like and spreads to the arms, back, neck, or jaw. THIS IS AN EMERGENCY. Do not wait to see if the pain will go away. Get medical help at once. Call your local emergency services (911 in the [place] DO NOT drive yourself to the hospital.   You have stroke-like symptoms such as:   Loss of vision.   Difficulty talking.   Numbness or weakness on one side of your body.     Numbness or weakness in your arm or leg.   Not thinking clearly or feeling confused.   Your bowel movements are bloody, dark red or black in color.   You vomit or cough up blood.   You have blood in your urine.   You have shortness of breath, coughing or wheezing.  MAKE SURE YOU:   Understand these instructions.   Will monitor your condition.   Seek immediate medical care if necessary.  Document Released: 02/20/2008 Document Revised: 02/26/2011 Document Reviewed: 02/20/2008 [facility] Patient Information 2012 [place], [place].Arterial Hypertension Arterial hypertension (high blood pressure) is a condition of elevated pressure in your blood vessels. Hypertension over a long period of time is a risk factor for strokes, heart attacks, and heart failure. It is also the leading cause of kidney (renal) failure.  CAUSES   In Adults -- Over 90% of all hypertension has no known cause. This is called essential or primary hypertension. In the other 10% of people with hypertension, the increase in blood pressure is caused by another disorder. This is called secondary hypertension. Important causes of secondary hypertension are:   Heavy alcohol use.   Obstructive sleep apnea.   Hyperaldosterosim (Conn's syndrome).   Steroid use.   Chronic kidney failure.   Hyperparathyroidism.   Medications.   Renal artery stenosis.   Pheochromocytoma.   Cushing's disease.   Coarctation of the aorta.   Scleroderma renal crisis.   Licorice (in excessive amounts).   Drugs (cocaine, methamphetamine).  Your caregiver can explain any items above that apply to you.  In Children -- Secondary hypertension is more common and should always be considered.   Pregnancy -- Few women of childbearing age have high blood pressure. However, up to 10% of them develop hypertension of pregnancy. Generally, this will not harm the woman. It may be a sign of 3 complications of pregnancy: preeclampsia, HELLP  syndrome, and eclampsia. Follow up and control with medication is necessary.  SYMPTOMS   This condition normally does not produce any noticeable symptoms. It is usually found during a routine exam.   Malignant hypertension is a late problem of high blood pressure. It may have the following symptoms:   Headaches.   Blurred vision.   End-organ damage (this means your kidneys, heart, lungs, and other organs are being damaged).   Stressful situations can increase the blood pressure. If a person with normal blood pressure has their blood pressure go up while being seen by their caregiver, this is often termed "white coat hypertension." Its importance is not known. It may be related with eventually developing hypertension or complications of hypertension.   Hypertension is often confused with mental tension, stress, and anxiety.  DIAGNOSIS  The diagnosis is made by 3 separate blood pressure measurements. They are taken at least 1 week apart from each other. If there is organ damage from hypertension,  the diagnosis may be made without repeat measurements. Hypertension is usually identified by having blood pressure readings:  Above 140/90 mmHg measured in both arms, at 3 separate times, over a couple weeks.   Over 130/80 mmHg should be considered a risk factor and may require treatment in patients with diabetes.  Blood pressure readings over 120/80 mmHg are called "pre-hypertension" even in non-diabetic patients. To get a true blood pressure measurement, use the following guidelines. Be aware of the factors that can alter blood pressure readings.  Take measurements at least 1 hour after caffeine.   Take measurements 30 minutes after smoking and without any stress. This is another reason to quit smoking - it raises your blood pressure.   Use a proper cuff size. Ask your caregiver if you are not sure about your cuff size.   Most home blood pressure cuffs are automatic. They will measure  systolic and diastolic pressures. The systolic pressure is the pressure reading at the start of sounds. Diastolic pressure is the pressure at which the sounds disappear. If you are elderly, measure pressures in multiple postures. Try sitting, lying or standing.   Sit at rest for a minimum of 5 minutes before taking measurements.   You should not be on any medications like decongestants. These are found in many cold medications.   Record your blood pressure readings and review them with your caregiver.  If you have hypertension:  Your caregiver may do tests to be sure you do not have secondary hypertension (see "causes" above).   Your caregiver may also look for signs of metabolic syndrome. This is also called Syndrome X or Insulin Resistance Syndrome. You may have this syndrome if you have type 2 diabetes, abdominal obesity, and abnormal blood lipids in addition to hypertension.   Your caregiver will take your medical and family history and perform a physical exam.   Diagnostic tests may include blood tests (for glucose, cholesterol, potassium, and kidney function), a urinalysis, or an EKG. Other tests may also be necessary depending on your condition.  PREVENTION  There are important lifestyle issues that you can adopt to reduce your chance of developing hypertension:  Maintain a normal weight.   Limit the amount of salt (sodium) in your diet.   Exercise often.   Limit alcohol intake.   Get enough potassium in your diet. Discuss specific advice with your caregiver.   Follow a DASH diet (dietary approaches to stop hypertension). This diet is rich in fruits, vegetables, and low-fat dairy products, and avoids certain fats.  PROGNOSIS  Essential hypertension cannot be cured. Lifestyle changes and medical treatment can lower blood pressure and reduce complications. The prognosis of secondary hypertension depends on the underlying cause. Many people whose hypertension is controlled with  medicine or lifestyle changes can live a normal, healthy life.  RISKS AND COMPLICATIONS  While high blood pressure alone is not an illness, it often requires treatment due to its short- and long-term effects on many organs. Hypertension increases your risk for:  CVAs or strokes (cerebrovascular accident).   Heart failure due to chronically high blood pressure (hypertensive cardiomyopathy).   Heart attack (myocardial infarction).   Damage to the retina (hypertensive retinopathy).   Kidney failure (hypertensive nephropathy).  Your caregiver can explain list items above that apply to you. Treatment of hypertension can significantly reduce the risk of complications. TREATMENT   For overweight patients, weight loss and regular exercise are recommended. Physical fitness lowers blood pressure.   Mild hypertension is usually  treated with diet and exercise. A diet rich in fruits and vegetables, fat-free dairy products, and foods low in fat and salt (sodium) can help lower blood pressure. Decreasing salt intake decreases blood pressure in a 1/3 of people.   Stop smoking if you are a smoker.  The steps above are highly effective in reducing blood pressure. While these actions are easy to suggest, they are difficult to achieve. Most patients with moderate or severe hypertension end up requiring medications to bring their blood pressure down to a normal level. There are several classes of medications for treatment. Blood pressure pills (antihypertensives) will lower blood pressure by their different actions. Lowering the blood pressure by 10 mmHg may decrease the risk of complications by as much as 25%. The goal of treatment is effective blood pressure control. This will reduce your risk for complications. Your caregiver will help you determine the best treatment for you according to your lifestyle. What is excellent treatment for one person, may not be for you. HOME CARE INSTRUCTIONS   Do not smoke.    Follow the lifestyle changes outlined in the "Prevention" section.   If you are on medications, follow the directions carefully. Blood pressure medications must be taken as prescribed. Skipping doses reduces their benefit. It also puts you at risk for problems.   Follow up with your caregiver, as directed.   If you are asked to monitor your blood pressure at home, follow the guidelines in the "Diagnosis" section above.  SEEK MEDICAL CARE IF:   You think you are having medication side effects.   You have recurrent headaches or lightheadedness.   You have swelling in your ankles.   You have trouble with your vision.  SEEK IMMEDIATE MEDICAL CARE IF:   You have sudden onset of chest pain or pressure, difficulty breathing, or other symptoms of a heart attack.   You have a severe headache.   You have symptoms of a stroke (such as sudden weakness, difficulty speaking, difficulty walking).  MAKE SURE YOU:   Understand these instructions.   Will watch your condition.   Will get help right away if you are not doing well or get worse.  Document Released: 03/09/2005 Document Revised: 02/26/2011 Document Reviewed: 10/07/2006 [facility] Patient Information 2012 [place], [place].

## 2011-06-09 NOTE — Progress Notes (Signed)
Nutrition Brief Note:  Pt screened for nutrition risk via health hx screen; positive for wt loss.  Discussed wt trends with pt.  Pt reports increased stress due to family issues at home that have lead to mild depression, increased smoking, and decreased intake.  Pt reports he would like to quit smoking as this suppresses his appetite.  Pt talked with chaplain last night which he found very helpful and encouraging.  Pt feels he has a better plan of action.  He has made some healthy lifestyle changes at home since previous admission and would like to continue these changes as well as add new goals.  Discussed wt management with pt- no recent or significant wt loss.  All questions answered.    No nutrition dx or interventions at this time.  RD to continue to assess for changes in status and nutritional adequacy.  Pager: [phone]

## 2011-06-09 NOTE — Progress Notes (Signed)
  Echocardiogram 2D Echocardiogram has been performed.  Steven Brady L 06/09/2011, 3:26 PM

## 2011-06-09 NOTE — Progress Notes (Signed)
Dr. Elease Hashimoto aware of pt's urinary output and weight gain.

## 2011-06-09 NOTE — Progress Notes (Signed)
06/09/11 RN notified MD on call of elevated B/P (180 /102).MD ordered that patient have Metoprolol 25mg  now instead of at scheduled time later tonight.

## 2011-06-09 NOTE — Progress Notes (Signed)
Subjective:   56 yo AAM with history of tobacco abuse, HTN, hyperlipidemia and medication non-compliance who is admitted today after presenting to the [facility] ED with c/o chest pain occurring for the last year but with more frequent episodes over the last few days. He describes the chest pain as central sharp pains, radiating to his neck, left arm and back. This mainly occurs at rest and tends to be worsened by deep inspiration. He has a h/o HTN but has not been taking his anti-hypertensive meds for at least a year. He has been smoking 2ppd for at least 30 years. He has no prior cardiac history and no family history of CAD. He does not used cocaine or heroin. He does use marijuana. He has been under much stress at home and has been depressed. His BP on admission is 225/125. EKG with T wave inversion inferior and anterolateral leads with T wave flattening lateral leads, LVH, NSR. No active chest pain at this time.   He had continuous chest pain / pressure over the weekend  His cardiac enzymes are negative x 2.   ECG this AM reveals LVH with repol and is not changed from yesterday's tracing     . sodium chloride   Intravenous Once  . aspirin  324 mg Oral Once  . aspirin EC  81 mg Oral Daily  . citalopram  20 mg Oral Daily  . lisinopril  10 mg Oral Daily  . metoprolol tartrate  25 mg Oral BID  .  morphine injection  4 mg Intravenous Once  . nitroGLYCERIN  5 mcg/min Intravenous Once  . simvastatin  20 mg Oral q1800  . DISCONTD: citalopram  20 mg Oral Daily      . sodium chloride 50 mL/hr at 06/09/11 0539  . nitroGLYCERIN 25 mcg/min (06/08/11 2356)    Objective:  Vital Signs in the last 24 hours: Blood pressure 145/82, pulse 63, temperature 98.2 F (36.8 C), temperature source Oral, resp. rate 18, height 5\' 9"  (1.753 m), weight 158 lb 11.7 oz (72 kg), SpO2 100.00%. Temp:  [98.2 F (36.8 C)-99.4 F (37.4 C)] 98.2 F (36.8 C) (03/19 0552) Pulse Rate:  [58-82] 63  (03/19  0552) Resp:  [11-27] 18  (03/19 0552) BP: (145-248)/(82-183) 145/82 mmHg (03/19 0552) SpO2:  [96 %-100 %] 100 % (03/19 0552) Weight:  [154 lb 8.7 oz (70.1 kg)-158 lb 11.7 oz (72 kg)] 158 lb 11.7 oz (72 kg) (03/19 0500)  Intake/Output from previous day: 03/18 0701 - 03/19 0700 In: -  Out: 175 [Urine:175] Intake/Output from this shift:    Physical Exam:  Physical Exam: Blood pressure 145/82, pulse 63, temperature 98.2 F (36.8 C), temperature source Oral, resp. rate 18, height 5\' 9"  (1.753 m), weight 158 lb 11.7 oz (72 kg), SpO2 100.00%. General: Well developed, well nourished, in no acute distress. Head: Normocephalic, atraumatic, sclera non-icteric, mucus membranes are moist,  Neck: Supple. Normal carotids. No JVD Lungs: Clear bilaterally to auscultation without wheezes, rales, or rhonchi. Breathing is unlabored. Heart: Regular rate,  With normal  S1 S2. No murmurs, rubs, or gallops  Abdomen: Soft, non-tender, non-distended with normoactive bowel sounds. No hepatomegaly. No rebound/guarding. No abdominal masses. Msk:  Strength and tone appear normal for age. Extremities: No clubbing or cyanosis. No edema.  Distal pedal pulses are 2+ and equal bilaterally. Neuro: Alert and oriented X 3. Moves all extremities spontaneously. Psych:  Responds to questions appropriately with a normal affect.    Lab  Results:   Basename 06/09/11 0450 06/08/11 1448  NA 136 137  K 3.5 3.9  CL 105 101  CO2 24 27  GLUCOSE 112* 94  BUN 14 13  CREATININE 1.06 1.09  CALCIUM 8.3* 9.7  MG -- --  PHOS -- --    Basename 06/08/11 1448  AST 17  ALT 9  ALKPHOS 88  BILITOT 0.7  PROT 7.9  ALBUMIN 4.1   No results found for this basename: LIPASE:2,AMYLASE:2 in the last 72 hours  Basename 06/09/11 0450 06/08/11 1448  WBC 4.8 6.4  NEUTROABS -- 3.7  HGB 12.7* 16.0  HCT 37.2* 45.9  MCV 86.9 86.8  PLT 121* 160    Basename 06/09/11 0450 06/08/11 2321  CKTOTAL 103 111  CKMB 2.6 2.4  TROPONINI  <0.30 <0.30    Basename 06/09/11 0450  CHOL 164  HDL 43  LDLCALC 111*  TRIG 48  CHOLHDL 3.8    Tele: NSR  Assessment/Plan:   1. Chest pain:  i doubt this is cardiac -he has had continuous chest pain for days and his enzymes are negative. These were associated with marked HTN ( he had been off his meds).   Disposition: DC NTG.  Ambulate in halls .  If he does well and if echo is unremarkable he can go home today and follow up with his medical doctor.  We can see also Ival Bible) CD on current meds  Alvia Grove., MD, [facility] 06/09/2011, 7:07 AM LOS: Day 1

## 2011-06-09 NOTE — Discharge Summary (Signed)
CARDIOLOGY DISCHARGE SUMMARY   Patient ID: Steven Brady MRN: [id number] DOB/AGE: [birth year] 56 y.o.  Admit date: 06/08/2011 Discharge date: 06/09/2011  Primary Discharge Diagnosis:   . Chest pain at rest   Secondary Discharge Diagnosis:  Active Problems:  Tobacco abuse  Hyperlipidemia  Hypertension  Procedures: 2-D echocardiogram  Hospital Course: Steven Brady is a 56 year old male with a history of hypertension and other cardiac risk factors but no coronary artery disease. He had prolonged chest pain and came to the hospital where he was admitted for further evaluation and treatment.  His cardiac enzymes were negative for MI. He stated he had been depressed recently. His blood pressure was significantly elevated on admission at 225/125. He was started on blood pressure control medications. His blood pressure improved and his chest pain improved as well. His chest x-ray showed no acute disease and a 2-D echocardiogram showed a preserved EF with severe LVH but no wall motion abnormalities.  On 06/09/2011, he was seen by Dr. Elease Hashimoto. His chest pain has resolved. His ECG was unchanged. Since his cardiac enzymes and his 2-D echocardiogram showed no acute abnormalities and his symptoms had resolved, he will was considered stable for discharge, to followup with cardiology as needed. He is encouraged to obtain a primary care physician.  Labs:   Lab Results  Component Value Date   WBC 4.8 06/09/2011   HGB 12.7* 06/09/2011   HCT 37.2* 06/09/2011   MCV 86.9 06/09/2011   PLT 121* 06/09/2011    Lab 06/09/11 0450 06/08/11 1448  NA 136 --  K 3.5 --  CL 105 --  CO2 24 --  BUN 14 --  CREATININE 1.06 --  CALCIUM 8.3* --  PROT -- 7.9  BILITOT -- 0.7  ALKPHOS -- 88  ALT -- 9  AST -- 17  GLUCOSE 112* --    Basename 06/09/11 1108 06/09/11 0450 06/08/11 2321  CKTOTAL 217 103 111  CKMB 3.5 2.6 2.4  CKMBINDEX -- -- --  TROPONINI <0.30 <0.30 <0.30   Lipid Panel     Component Value  Date/Time   CHOL 164 06/09/2011 0450   TRIG 48 06/09/2011 0450   HDL 43 06/09/2011 0450   CHOLHDL 3.8 06/09/2011 0450   VLDL 10 06/09/2011 0450   LDLCALC 111* 06/09/2011 0450     Radiology:  Dg Chest 2 View 06/08/2011  *RADIOLOGY REPORT*  Clinical Data: Left-sided chest pain.  Hypertension.  Smoking history.  CHEST - 2 VIEW  Comparison: 08/25/2009  Findings: Artifact overlies the chest.  Heart size is normal.  The aorta is unfolded.  The pulmonary vascularity is normal.  Lungs are clear.  No effusions.  No significant bony findings.  IMPRESSION: No active disease.  Original Report Authenticated By: Thomasenia Sales, M.D.    EKG: 09-Jun-2011 07:06:45  Sinus bradycardia Left ventricular hypertrophy T wave abnormality, consider inferolateral ischemia Vent. rate 57 BPM PR interval 166 ms QRS duration 96 ms QT/QTc 454/441 ms P-R-T axes 57 37 262  Echo: 06/09/2011 Study Conclusions - Left ventricle: The cavity size was mildly dilated. Wall thickness was increased in a pattern of severe LVH. Systolic function was normal. The estimated ejection fraction was in the range of 55% to 60%. - Aortic valve: Trivial regurgitation. - Mitral valve: Mild regurgitation. - Left atrium: The atrium was mildly dilated. - Atrial septum: No defect or patent foramen ovale was identified. - Pulmonary arteries: PA peak pressure: 31mm Hg (S).   FOLLOW UP PLANS AND APPOINTMENTS  No Known  Allergies Medication List  As of 06/09/2011  4:09 PM   TAKE these medications         citalopram 20 MG tablet   Commonly known as: CELEXA   Take 1 tablet (20 mg total) by mouth daily.      lisinopril 10 MG tablet   Commonly known as: PRINIVIL,ZESTRIL   Take 1 tablet (10 mg total) by mouth daily.      metoprolol tartrate 25 MG tablet   Commonly known as: LOPRESSOR   Take 1 tablet (25 mg total) by mouth 2 (two) times daily.      simvastatin 20 MG tablet   Commonly known as: ZOCOR   Take 1 tablet (20 mg total) by mouth  daily at 6 PM.           Follow-up Information    Follow up with Verne Carrow, MD. (As needed)    Contact information:   [facility] [address] [place] [address] [phone]    You need to obtain a Primary MD.      Layla Maw ALL MEDICATIONS WITH YOU TO FOLLOW UP APPOINTMENTS  Time spent with patient to include physician time: 33 min Signed: Theodore Demark 06/09/2011, 4:09 PM Co-Sign MD  Attending Note:   The patient was seen and examined.  Agree with assessment and plan as noted above.  See my note from earlier today. His BP remains labile.  He'll need to follow up with his medical doctor.  Vesta Mixer, Montez Hageman., MD, [facility] 06/09/2011, 5:44 PM

## 2013-03-23 DIAGNOSIS — I509 Heart failure, unspecified: Secondary | ICD-10-CM

## 2013-03-23 HISTORY — DX: Heart failure, unspecified: I50.9

## 2014-07-02 ENCOUNTER — Emergency Department (HOSPITAL_COMMUNITY)
Admission: EM | Admit: 2014-07-02 | Discharge: 2014-07-02 | Disposition: A | Discharge status: Home / Self Care | Payer: Self-pay | Attending: Emergency Medicine | Admitting: Emergency Medicine

## 2014-07-02 ENCOUNTER — Encounter (HOSPITAL_COMMUNITY): Payer: Self-pay

## 2014-07-02 ENCOUNTER — Emergency Department (HOSPITAL_COMMUNITY): Payer: Self-pay

## 2014-07-02 DIAGNOSIS — Z720 Tobacco use: Secondary | ICD-10-CM | POA: Insufficient documentation

## 2014-07-02 DIAGNOSIS — R079 Chest pain, unspecified: Secondary | ICD-10-CM | POA: Insufficient documentation

## 2014-07-02 DIAGNOSIS — Z79899 Other long term (current) drug therapy: Secondary | ICD-10-CM | POA: Insufficient documentation

## 2014-07-02 DIAGNOSIS — I10 Essential (primary) hypertension: Secondary | ICD-10-CM | POA: Insufficient documentation

## 2014-07-02 DIAGNOSIS — Z7982 Long term (current) use of aspirin: Secondary | ICD-10-CM | POA: Insufficient documentation

## 2014-07-02 DIAGNOSIS — Z8739 Personal history of other diseases of the musculoskeletal system and connective tissue: Secondary | ICD-10-CM | POA: Insufficient documentation

## 2014-07-02 DIAGNOSIS — R0602 Shortness of breath: Secondary | ICD-10-CM | POA: Insufficient documentation

## 2014-07-02 DIAGNOSIS — R42 Dizziness and giddiness: Secondary | ICD-10-CM | POA: Insufficient documentation

## 2014-07-02 DIAGNOSIS — E785 Hyperlipidemia, unspecified: Secondary | ICD-10-CM | POA: Insufficient documentation

## 2014-07-02 DIAGNOSIS — H538 Other visual disturbances: Secondary | ICD-10-CM | POA: Insufficient documentation

## 2014-07-02 HISTORY — DX: Heart failure, unspecified: I50.9

## 2014-07-02 LAB — CBC
HEMATOCRIT: 45.6 % (ref 39.0–52.0)
HEMOGLOBIN: 15.7 g/dL (ref 13.0–17.0)
MCH: 30.3 pg (ref 26.0–34.0)
MCHC: 34.4 g/dL (ref 30.0–36.0)
MCV: 87.9 fL (ref 78.0–100.0)
PLATELETS: 89 10*3/uL — AB (ref 150–400)
RBC: 5.19 MIL/uL (ref 4.22–5.81)
RDW: 13.7 % (ref 11.5–15.5)
WBC: 4.4 10*3/uL (ref 4.0–10.5)

## 2014-07-02 LAB — BASIC METABOLIC PANEL
Anion gap: 13 (ref 5–15)
BUN: 17 mg/dL (ref 6–23)
CALCIUM: 9.7 mg/dL (ref 8.4–10.5)
CO2: 25 mmol/L (ref 19–32)
CREATININE: 1.2 mg/dL (ref 0.50–1.35)
Chloride: 101 mmol/L (ref 96–112)
GFR calc non Af Amer: 65 mL/min — ABNORMAL LOW (ref >90)
GFR, EST AFRICAN AMERICAN: 76 mL/min — AB (ref >90)
Glucose, Bld: 102 mg/dL — ABNORMAL HIGH (ref 70–99)
Potassium: 3.6 mmol/L (ref 3.5–5.1)
Sodium: 139 mmol/L (ref 135–145)

## 2014-07-02 LAB — I-STAT TROPONIN, ED: Troponin i, poc: 0.02 ng/mL (ref 0.00–0.08)

## 2014-07-02 LAB — BRAIN NATRIURETIC PEPTIDE: B NATRIURETIC PEPTIDE 5: 76.4 pg/mL (ref 0.0–100.0)

## 2014-07-02 MED ORDER — ASPIRIN 325 MG PO TABS
325.0000 mg | ORAL_TABLET | ORAL | Status: AC
  Administered 2014-07-02: 325 mg via ORAL
  Filled 2014-07-02: qty 1

## 2014-07-02 MED ORDER — LISINOPRIL 10 MG PO TABS
10.0000 mg | ORAL_TABLET | Freq: Once | ORAL | Status: AC
  Administered 2014-07-02: 10 mg via ORAL
  Filled 2014-07-02: qty 1

## 2014-07-02 MED ORDER — MECLIZINE HCL 25 MG PO TABS
50.0000 mg | ORAL_TABLET | Freq: Once | ORAL | Status: AC
  Administered 2014-07-02: 50 mg via ORAL
  Filled 2014-07-02: qty 2

## 2014-07-02 MED ORDER — LISINOPRIL 10 MG PO TABS: 10.0000 mg | ORAL_TABLET | Freq: Every day | ORAL | Status: DC

## 2014-07-02 NOTE — ED Notes (Addendum)
Sharp pain under his lt. Arm into his lt. Side of his chest began on Friday, describes the pain as sharp , intermittent and he  Has episodes of dizziness. GCS 15.  Denies any cold symptoms .  Sob with the chest pain.   5/10 present.  Pt. Reports the pain feels better when he takes his Coreg.

## 2014-07-02 NOTE — ED Notes (Signed)
Patient ambulated in hallway. Patient complained of dizziness but otherwise ambulated well.

## 2014-07-02 NOTE — ED Provider Notes (Signed)
CSN: [id number]     Arrival date & time 07/02/14  1004 History   First MD Initiated Contact with Patient 07/02/14 1010     Chief Complaint  Patient presents with  . Chest Pain     (Consider location/radiation/quality/duration/timing/severity/associated sxs/prior Treatment) HPI Comments: Patient with h/o HTN, high cholesterol, tobacco use, hospitalization 05/2011 for chest pain with abnormal EKG and was ruled out, normal ECHO, no apparent follow-up stress testing -- presents with c/o sharp left lateral chest pain which started greater than 48 hours ago, has been waxing and waning, radiates to left anterior chest. It is not gone completely away during this time. He has some mild SOB with chest pain at times. CP seems to be worse with movement and deep breathing. It is worse with palpation. Patient denies risk factors for pulmonary embolism including: unilateral leg swelling, history of DVT/PE/other blood clots, use of estrogens, recent immobilizations, recent surgery, recent travel (>4hr segment), malignancy, hemoptysis. Patient also complains of intermittent vertigo which has been happening for at least one month. He's had multiple episodes of a "spinning" sensation over the past several days including this morning. This seems to be worse with position and movement. It has made him vomit in the past it is difficult for him to walk when he feels dizzy. Patient also has complaint of headache. Patient denies signs of stroke including: facial droop, slurred speech, aphasia, weakness/numbness in extremities, imbalance/trouble walking.  No history of DM or family history of CAD.    Patient is a 59 y.o. male presenting with chest pain. The history is provided by the patient and medical records.  Chest Pain Associated symptoms: dizziness, headache and shortness of breath   Associated symptoms: no abdominal pain, no back pain, no cough, no diaphoresis, no fever, no nausea, no numbness, no palpitations, not  vomiting and no weakness     Past Medical History  Diagnosis Date  . Hypertension   . 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    recently hurt left ankle - 2 weeks ago  . Hematuria - cause not known 06/08/11    pt has been seeing blood off and on in urine  . Hyperlipidemia   . Tobacco abuse    Past Surgical History  Procedure Laterality Date  . No past surgeries     Family History  Problem Relation Age of Onset  . Dementia Mother    History  Substance Use Topics  . Smoking status: Current Every Day Smoker -- 2.00 packs/day    Types: Cigarettes  . Smokeless tobacco: Never Used  . Alcohol Use: No    Review of Systems  Constitutional: Negative for fever and diaphoresis.  HENT: Negative for congestion, dental problem, rhinorrhea and sinus pressure.   Eyes: Positive for visual disturbance (sometimes blurry but no vision loss). Negative for photophobia, discharge and redness.  Respiratory: Positive for shortness of breath. Negative for cough.   Cardiovascular: Positive for chest pain. Negative for palpitations and leg swelling.  Gastrointestinal: Negative for nausea, vomiting and abdominal pain.  Genitourinary: Negative for dysuria.  Musculoskeletal: Negative for back pain, gait problem, neck pain and neck stiffness.  Skin: Negative for rash.  Neurological: Positive for dizziness and headaches. Negative for syncope, speech difficulty, weakness, light-headedness and numbness.  Psychiatric/Behavioral: Negative for confusion. The patient is not nervous/anxious.       Allergies  Review of patient's allergies indicates no known allergies.  Home Medications   Prior to Admission medications   Medication Sig Start Date End Date Taking?  Authorizing Provider  aspirin EC 81 MG tablet Take 81 mg by mouth daily.   Yes Historical Provider, MD  atorvastatin (LIPITOR) 40 MG tablet Take 40 mg by mouth daily. 03/04/14  Yes Historical Provider, MD  carvedilol (COREG) 12.5 MG tablet Take 12.5 mg by mouth  2 (two) times daily. 03/04/14  Yes Historical Provider, MD  Multiple Vitamins-Minerals (CENTRUM ADULTS PO) Take 1 tablet by mouth daily.   Yes Historical Provider, MD  dimenhyDRINATE (DRAMAMINE) 50 MG tablet Take 50 mg by mouth every 8 (eight) hours as needed for nausea.    Historical Provider, MD   BP 200/116 mmHg  Pulse 61  Temp(Src) 98.3 F (36.8 C) (Oral)  Resp 11  SpO2 99%   Physical Exam  Constitutional: He is oriented to person, place, and time. He appears well-developed and well-nourished.  HENT:  Head: Normocephalic and atraumatic.  Right Ear: Tympanic membrane, external ear and ear canal normal.  Left Ear: Tympanic membrane, external ear and ear canal normal.  Nose: Nose normal.  Mouth/Throat: Uvula is midline, oropharynx is clear and moist and mucous membranes are normal. Mucous membranes are not dry.  Eyes: Conjunctivae, EOM and lids are normal. Pupils are equal, round, and reactive to light.  Neck: Trachea normal and normal range of motion. Neck supple. Normal carotid pulses and no JVD present. No muscular tenderness present. Carotid bruit is not present. No tracheal deviation present.  No cartotid bruits.  Cardiovascular: Normal rate, regular rhythm, S1 normal, S2 normal, normal heart sounds and intact distal pulses.  Exam reveals no distant heart sounds and no decreased pulses.   No murmur heard. Pulmonary/Chest: Effort normal and breath sounds normal. No respiratory distress. He has no wheezes. He exhibits tenderness (L anterior chest wall, L lateral chest wall inferior to axilla).    Abdominal: Soft. Normal aorta and bowel sounds are normal. There is no tenderness. There is no rebound and no guarding.  Musculoskeletal: Normal range of motion. He exhibits no edema.       Cervical back: He exhibits normal range of motion, no tenderness and no bony tenderness.  Neurological: He is alert and oriented to person, place, and time. He has normal strength and normal reflexes. No  cranial nerve deficit or sensory deficit. He exhibits normal muscle tone. He displays a negative Romberg sign. Coordination and gait normal. GCS eye subscore is 4. GCS verbal subscore is 5. GCS motor subscore is 6.  Skin: Skin is warm and dry. He is not diaphoretic. No cyanosis. No pallor.  Psychiatric: He has a normal mood and affect.  Nursing note and vitals reviewed.   ED Course  Procedures (including critical care time) Labs Review Labs Reviewed  CBC - Abnormal; Notable for the following:    Platelets 89 (*)    All other components within normal limits  BASIC METABOLIC PANEL - Abnormal; Notable for the following:    Glucose, Bld 102 (*)    GFR calc non Af Amer 65 (*)    GFR calc Af Amer 76 (*)    All other components within normal limits  BRAIN NATRIURETIC PEPTIDE  I-STAT TROPOININ, ED    Imaging Review Dg Chest 2 View  07/02/2014   CLINICAL DATA:  Sharp pain under left arm, shortness of breath  EXAM: CHEST  2 VIEW  COMPARISON:  06/08/2011  FINDINGS: The heart size and mediastinal contours are within normal limits. Both lungs are clear. The visualized skeletal structures are unremarkable.  IMPRESSION: No active cardiopulmonary disease.  Electronically Signed   By: Alcide Clever M.D.   On: 07/02/2014 12:01     EKG Interpretation   Date/Time:  Monday July 02 2014 10:13:23 EDT Ventricular Rate:  72 PR Interval:  179 QRS Duration: 102 QT Interval:  407 QTC Calculation: 445 R Axis:   35 Text Interpretation:  Sinus rhythm LVH with secondary repolarization  abnormality Anterior ST elevation, probably due to LVH No significant  change was found Confirmed by CAMPOS  MD, KEVIN ([id number]) on 07/02/2014  10:22:48 AM      10:28 AM Patient seen and examined. Work-up initiated. Medications ordered. EKG reviewed with Dr. Patria Mane. Does not appear to be appreciably changed from 2013.   Vital signs reviewed and are as follows: BP 194/125 mmHg  Pulse 71  Temp(Src) 98.3 F (36.8 C)  (Oral)  Resp 13  SpO2 100%  1:43 PM Patient continues to be hypertensive, but otherwise well. Work-up is unremarkable. Patient discussed with and seen by Dr. Patria Mane.   Lisinopril given prior to discharge for elevated blood pressure. Patient is to continue Coreg.  Patient to follow-up at [facility] in 2 days for a recheck of his blood pressure and to discuss management of his hypertension. Patient provided with a brochure as well.   Patient ambulated in the hallway without assistance. Neuro exam remains unchanged.  Patient was counseled to return with severe chest pain, especially if the pain is crushing or pressure-like and spreads to the arms, back, neck, or jaw, or if they have sweating, nausea, or shortness of breath with the pain. They were encouraged to call 911 with these symptoms.   They were also told to return if their chest pain gets worse and does not go away with rest, they have an attack of chest pain lasting longer than usual despite rest and treatment with the medications their caregiver has prescribed, if they wake from sleep with chest pain or shortness of breath, if they feel dizzy or faint, if they have chest pain not typical of their usual pain, or if they have any other emergent concerns regarding their health.  The patient verbalized understanding and agreed.    MDM   Final diagnoses:  Chest pain, unspecified chest pain type  Dizziness  Essential hypertension   Chest pain: EKG is abnormal but unchanged from previous. Chest pain is atypical. Troponin is negative (patient has had pain for greater than 48 hours). Very low suspicion for ACS. Do not suspect PE. Chest pain is reproducible to palpation on exam. Most likely musculoskeletal in nature.  Dizziness: Described as vertigo. Will work on controlling blood pressure to see if this improves symptoms. Intermittent dizzy spells over the past one month. Low suspicion for posterior stroke. Do not feel MRI  is indicated at this time. Patient is ambulatory. No other neuro findings on exam.  Hypertension: Patient is taking and compliant with Coreg 6.25 mg. Given elevated blood pressure in ED, will add lisinopril due to renal protection benefit given prolonged history of HTN. Patient to follow-up with PCP for further titration and medication adjustments.  No dangerous or life-threatening conditions suspected or identified by history, physical exam, and by work-up. No indications for hospitalization identified.   Thrombocytopenia: noted, PCP f/u, no current complications from this.     Renne Crigler, PA-C 07/02/14 1347  Azalia Bilis, MD 07/02/14 [phone]

## 2014-07-02 NOTE — Discharge Instructions (Signed)
Please read and follow all provided instructions.  Your diagnoses today include:  1. Chest pain, unspecified chest pain type   2. Dizziness   3. Essential hypertension     Tests performed today include:  An EKG of your heart - shows changes related to high blood pressure  A chest x-ray  Cardiac enzymes - a blood test for heart muscle damage, no sign of heart attack  Blood counts and electrolytes  Vital signs. See below for your results today.   Medications prescribed:   Lisinopril - additional medication for high blood pressure  Take any prescribed medications only as directed.  Follow-up instructions: Please follow-up with your primary care provider as soon as you can for further evaluation of your symptoms.   Return instructions:  SEEK IMMEDIATE MEDICAL ATTENTION IF:  You have severe chest pain, especially if the pain is crushing or pressure-like and spreads to the arms, back, neck, or jaw, or if you have sweating, nausea (feeling sick to your stomach), or shortness of breath. THIS IS AN EMERGENCY. Don't wait to see if the pain will go away. Get medical help at once. Call 911 or 0 (operator). DO NOT drive yourself to the hospital.   Your chest pain gets worse and does not go away with rest.   You have an attack of chest pain lasting longer than usual, despite rest and treatment with the medications your caregiver has prescribed.   You wake from sleep with chest pain or shortness of breath.  You feel dizzy or faint.  You have chest pain not typical of your usual pain for which you originally saw your caregiver.   You have any other emergent concerns regarding your health.  Additional Information: Chest pain comes from many different causes. Your caregiver has diagnosed you as having chest pain that is not specific for one problem, but does not require admission.  You are at low risk for an acute heart condition or other serious illness.   Your vital signs today  were: BP 194/115 mmHg   Pulse 68   Temp(Src) 98.3 F (36.8 C) (Oral)   Resp 16   SpO2 99% If your blood pressure (BP) was elevated above 135/85 this visit, please have this repeated by your doctor within one month. --------------

## 2014-07-02 NOTE — ED Notes (Signed)
Lab states BMP specimen hemolyzed, new order added for BMP

## 2014-07-10 ENCOUNTER — Other Ambulatory Visit: Payer: Self-pay

## 2014-07-10 ENCOUNTER — Ambulatory Visit (HOSPITAL_COMMUNITY)
Admission: RE | Admit: 2014-07-10 | Discharge: 2014-07-10 | Disposition: A | Discharge status: Home / Self Care | Payer: Medicaid Other | Source: Ambulatory Visit | Attending: Cardiology | Admitting: Cardiology

## 2014-07-10 ENCOUNTER — Encounter (HOSPITAL_COMMUNITY): Payer: Self-pay | Admitting: Emergency Medicine

## 2014-07-10 ENCOUNTER — Encounter: Payer: Self-pay | Admitting: Family Medicine

## 2014-07-10 ENCOUNTER — Inpatient Hospital Stay (HOSPITAL_COMMUNITY)
Admission: EM | Admit: 2014-07-10 | Discharge: 2014-07-12 | DRG: 683 | Disposition: A | Discharge status: Home / Self Care | Payer: Self-pay | Attending: Internal Medicine | Admitting: Internal Medicine

## 2014-07-10 ENCOUNTER — Ambulatory Visit: Payer: Self-pay | Attending: Family Medicine | Admitting: Family Medicine

## 2014-07-10 VITALS — BP 200/118 | HR 53 | Temp 98.0°F | Resp 18 | Ht 68.0 in | Wt 181.0 lb

## 2014-07-10 DIAGNOSIS — I10 Essential (primary) hypertension: Secondary | ICD-10-CM | POA: Insufficient documentation

## 2014-07-10 DIAGNOSIS — Z7982 Long term (current) use of aspirin: Secondary | ICD-10-CM

## 2014-07-10 DIAGNOSIS — I739 Peripheral vascular disease, unspecified: Secondary | ICD-10-CM | POA: Diagnosis present

## 2014-07-10 DIAGNOSIS — I509 Heart failure, unspecified: Secondary | ICD-10-CM | POA: Diagnosis present

## 2014-07-10 DIAGNOSIS — I517 Cardiomegaly: Secondary | ICD-10-CM | POA: Insufficient documentation

## 2014-07-10 DIAGNOSIS — F17210 Nicotine dependence, cigarettes, uncomplicated: Secondary | ICD-10-CM | POA: Insufficient documentation

## 2014-07-10 DIAGNOSIS — Z9114 Patient's other noncompliance with medication regimen: Secondary | ICD-10-CM | POA: Diagnosis present

## 2014-07-10 DIAGNOSIS — E785 Hyperlipidemia, unspecified: Secondary | ICD-10-CM | POA: Insufficient documentation

## 2014-07-10 DIAGNOSIS — N179 Acute kidney failure, unspecified: Secondary | ICD-10-CM | POA: Diagnosis present

## 2014-07-10 DIAGNOSIS — N183 Chronic kidney disease, stage 3 (moderate): Secondary | ICD-10-CM | POA: Diagnosis present

## 2014-07-10 DIAGNOSIS — H8190 Unspecified disorder of vestibular function, unspecified ear: Secondary | ICD-10-CM | POA: Diagnosis present

## 2014-07-10 DIAGNOSIS — I160 Hypertensive urgency: Secondary | ICD-10-CM | POA: Diagnosis present

## 2014-07-10 DIAGNOSIS — I6381 Other cerebral infarction due to occlusion or stenosis of small artery: Secondary | ICD-10-CM | POA: Insufficient documentation

## 2014-07-10 DIAGNOSIS — R001 Bradycardia, unspecified: Secondary | ICD-10-CM | POA: Insufficient documentation

## 2014-07-10 DIAGNOSIS — I129 Hypertensive chronic kidney disease with stage 1 through stage 4 chronic kidney disease, or unspecified chronic kidney disease: Principal | ICD-10-CM | POA: Diagnosis present

## 2014-07-10 DIAGNOSIS — R42 Dizziness and giddiness: Secondary | ICD-10-CM | POA: Insufficient documentation

## 2014-07-10 LAB — CBC
HCT: 49.5 % (ref 39.0–52.0)
HEMOGLOBIN: 16.9 g/dL (ref 13.0–17.0)
MCH: 30.5 pg (ref 26.0–34.0)
MCHC: 34.1 g/dL (ref 30.0–36.0)
MCV: 89.2 fL (ref 78.0–100.0)
Platelets: 103 10*3/uL — ABNORMAL LOW (ref 150–400)
RBC: 5.55 MIL/uL (ref 4.22–5.81)
RDW: 13.8 % (ref 11.5–15.5)
WBC: 6.7 10*3/uL (ref 4.0–10.5)

## 2014-07-10 LAB — BASIC METABOLIC PANEL
ANION GAP: 9 (ref 5–15)
BUN: 31 mg/dL — ABNORMAL HIGH (ref 6–23)
CALCIUM: 9.5 mg/dL (ref 8.4–10.5)
CO2: 29 mmol/L (ref 19–32)
Chloride: 102 mmol/L (ref 96–112)
Creatinine, Ser: 1.52 mg/dL — ABNORMAL HIGH (ref 0.50–1.35)
GFR calc Af Amer: 57 mL/min — ABNORMAL LOW (ref >90)
GFR calc non Af Amer: 49 mL/min — ABNORMAL LOW (ref >90)
Glucose, Bld: 93 mg/dL (ref 70–99)
Potassium: 4.3 mmol/L (ref 3.5–5.1)
Sodium: 140 mmol/L (ref 135–145)

## 2014-07-10 LAB — URINALYSIS, ROUTINE W REFLEX MICROSCOPIC
Bilirubin Urine: NEGATIVE
Glucose, UA: NEGATIVE mg/dL
Hgb urine dipstick: NEGATIVE
Ketones, ur: NEGATIVE mg/dL
LEUKOCYTES UA: NEGATIVE
NITRITE: NEGATIVE
PROTEIN: NEGATIVE mg/dL
Specific Gravity, Urine: 1.019 (ref 1.005–1.030)
Urobilinogen, UA: 0.2 mg/dL (ref 0.0–1.0)
pH: 5.5 (ref 5.0–8.0)

## 2014-07-10 LAB — TROPONIN I

## 2014-07-10 MED ORDER — ACETAMINOPHEN 325 MG PO TABS: 650.0000 mg | ORAL_TABLET | Freq: Four times a day (QID) | ORAL | Status: DC | PRN

## 2014-07-10 MED ORDER — HYDRALAZINE HCL 20 MG/ML IJ SOLN
10.0000 mg | Freq: Once | INTRAMUSCULAR | Status: AC
  Administered 2014-07-10: 10 mg via INTRAVENOUS
  Filled 2014-07-10: qty 1

## 2014-07-10 MED ORDER — ACETAMINOPHEN 650 MG RE SUPP: 650.0000 mg | Freq: Four times a day (QID) | RECTAL | Status: DC | PRN

## 2014-07-10 MED ORDER — HYDRALAZINE HCL 25 MG PO TABS
25.0000 mg | ORAL_TABLET | Freq: Three times a day (TID) | ORAL | Status: DC
  Administered 2014-07-10 – 2014-07-12 (×6): 25 mg via ORAL
  Filled 2014-07-10 (×8): qty 1

## 2014-07-10 MED ORDER — ONDANSETRON HCL 4 MG PO TABS: 4.0000 mg | ORAL_TABLET | Freq: Four times a day (QID) | ORAL | Status: DC | PRN

## 2014-07-10 MED ORDER — HYDRALAZINE HCL 20 MG/ML IJ SOLN
10.0000 mg | Freq: Four times a day (QID) | INTRAMUSCULAR | Status: DC | PRN
  Administered 2014-07-12: 10 mg via INTRAVENOUS
  Filled 2014-07-10: qty 1

## 2014-07-10 MED ORDER — MECLIZINE HCL 25 MG PO TABS
25.0000 mg | ORAL_TABLET | Freq: Once | ORAL | Status: AC
  Administered 2014-07-10: 25 mg via ORAL
  Filled 2014-07-10: qty 1

## 2014-07-10 MED ORDER — LISINOPRIL 10 MG PO TABS
10.0000 mg | ORAL_TABLET | Freq: Once | ORAL | Status: AC
  Administered 2014-07-10: 10 mg via ORAL
  Filled 2014-07-10: qty 1

## 2014-07-10 MED ORDER — CARVEDILOL 12.5 MG PO TABS
12.5000 mg | ORAL_TABLET | Freq: Two times a day (BID) | ORAL | Status: DC
  Administered 2014-07-10 – 2014-07-12 (×5): 12.5 mg via ORAL
  Filled 2014-07-10 (×5): qty 1

## 2014-07-10 MED ORDER — SODIUM CHLORIDE 0.9 % IV SOLN
INTRAVENOUS | Status: DC
  Administered 2014-07-11: 02:00:00 via INTRAVENOUS
  Administered 2014-07-11: 50 mL/h via INTRAVENOUS

## 2014-07-10 MED ORDER — ATORVASTATIN CALCIUM 40 MG PO TABS
40.0000 mg | ORAL_TABLET | Freq: Every day | ORAL | Status: DC
  Administered 2014-07-11 – 2014-07-12 (×2): 40 mg via ORAL
  Filled 2014-07-10 (×2): qty 1

## 2014-07-10 MED ORDER — AMLODIPINE BESYLATE 5 MG PO TABS
5.0000 mg | ORAL_TABLET | Freq: Every day | ORAL | Status: DC
  Administered 2014-07-11 – 2014-07-12 (×2): 5 mg via ORAL
  Filled 2014-07-10 (×2): qty 1

## 2014-07-10 MED ORDER — ENOXAPARIN SODIUM 40 MG/0.4ML ~~LOC~~ SOLN
40.0000 mg | SUBCUTANEOUS | Status: DC
  Administered 2014-07-10 – 2014-07-11 (×2): 40 mg via SUBCUTANEOUS
  Filled 2014-07-10 (×3): qty 0.4

## 2014-07-10 MED ORDER — ONDANSETRON HCL 4 MG/2ML IJ SOLN: 4.0000 mg | Freq: Four times a day (QID) | INTRAMUSCULAR | Status: DC | PRN

## 2014-07-10 MED ORDER — ASPIRIN EC 81 MG PO TBEC
81.0000 mg | DELAYED_RELEASE_TABLET | Freq: Every day | ORAL | Status: DC
  Administered 2014-07-11 – 2014-07-12 (×2): 81 mg via ORAL
  Filled 2014-07-10 (×2): qty 1

## 2014-07-10 MED ORDER — SODIUM CHLORIDE 0.9 % IV BOLUS (SEPSIS)
1000.0000 mL | Freq: Once | INTRAVENOUS | Status: AC
  Administered 2014-07-10: 1000 mL via INTRAVENOUS

## 2014-07-10 NOTE — ED Notes (Signed)
Per EMS [facility] BP 200/130, HR 52. Pt sts he started taken lisinopril 1 week ago.

## 2014-07-10 NOTE — Progress Notes (Signed)
Patient hospitalized, discharged last Monday, for blood pressure, dizziness. Patient having dizzy spells, comes on all of a sudden, gets sweaty and nauseous. Hospital gave prescription for lisinopril, patient has not had prescription filled yet.

## 2014-07-10 NOTE — Progress Notes (Signed)
Pt admitted to unit from ED. VSS. No issues at this time. Few orders; notified attending for admission orders and diet. Pt has no respiratory distress and quick assessment performed. Pt A&Ox4. Will continue to monitor pt closely. Jillyn HiddenStone,Bryton Romagnoli R, RN

## 2014-07-10 NOTE — ED Provider Notes (Signed)
CSN: [id number]     Arrival date & time 07/10/14  1416 History   First MD Initiated Contact with Patient 07/10/14 1426     Chief Complaint  Patient presents with  . Hypertension   (Consider location/radiation/quality/duration/timing/severity/associated sxs/prior Treatment) Patient is a 59 y.o. male presenting with dizziness. The history is provided by the patient. No language interpreter was used.  Dizziness Quality:  Vertigo and room spinning Severity:  Mild Onset quality:  Gradual Timing:  Intermittent Progression:  Waxing and waning Chronicity:  Chronic Context: head movement, inactivity and standing up   Relieved by:  Nothing Worsened by:  Movement and turning head Ineffective treatments:  None tried Associated symptoms: tinnitus   Associated symptoms: no blood in stool, no chest pain, no headaches, no nausea, no palpitations, no shortness of breath, no vision changes, no vomiting and no weakness   Risk factors: new medications   Risk factors: no anemia and no hx of vertigo     Past Medical History  Diagnosis Date  . Hypertension   . 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    recently hurt left ankle - 2 weeks ago  . Hematuria - cause not known 06/08/11    pt has been seeing blood off and on in urine  . Hyperlipidemia   . Tobacco abuse   . CHF (congestive heart failure)    Past Surgical History  Procedure Laterality Date  . No past surgeries     Family History  Problem Relation Age of Onset  . Dementia Mother    History  Substance Use Topics  . Smoking status: Current Every Day Smoker -- 0.25 packs/day    Types: Cigarettes  . Smokeless tobacco: Never Used  . Alcohol Use: No    Review of Systems  Constitutional: Negative for fever, diaphoresis and fatigue.  HENT: Positive for tinnitus. Negative for voice change.   Respiratory: Negative for chest tightness and shortness of breath.   Cardiovascular: Negative for chest pain and palpitations.  Gastrointestinal: Negative for  nausea, vomiting, abdominal pain and blood in stool.  Musculoskeletal: Negative for neck stiffness.  Neurological: Positive for dizziness. Negative for facial asymmetry, speech difficulty, weakness, light-headedness and headaches.  Psychiatric/Behavioral: Negative for confusion.  All other systems reviewed and are negative.     Allergies  Review of patient's allergies indicates no known allergies.  Home Medications   Prior to Admission medications   Medication Sig Start Date End Date Taking? Authorizing Provider  aspirin EC 81 MG tablet Take 81 mg by mouth daily.    Historical Provider, MD  atorvastatin (LIPITOR) 40 MG tablet Take 40 mg by mouth daily. 03/04/14   Historical Provider, MD  carvedilol (COREG) 12.5 MG tablet Take 12.5 mg by mouth 2 (two) times daily. 03/04/14   Historical Provider, MD  dimenhyDRINATE (DRAMAMINE) 50 MG tablet Take 50 mg by mouth every 8 (eight) hours as needed for nausea.    Historical Provider, MD  furosemide (LASIX) 40 MG tablet Take 40 mg by mouth daily.    Historical Provider, MD  lisinopril (PRINIVIL,ZESTRIL) 10 MG tablet Take 1 tablet (10 mg total) by mouth daily. 06/09/11 06/08/12  Rhonda G Barrett, PA-C  lisinopril (PRINIVIL,ZESTRIL) 10 MG tablet Take 1 tablet (10 mg total) by mouth daily. Patient not taking: Reported on 07/10/2014 07/02/14   Renne Crigler, PA-C  Multiple Vitamins-Minerals (CENTRUM ADULTS PO) Take 1 tablet by mouth daily.    Historical Provider, MD    ED Triage Vitals  Enc Vitals Group  BP 07/10/14 1421 208/122 mmHg     Pulse Rate 07/10/14 1430 57     Resp 07/10/14 1500 17     Temp 07/10/14 1500 98 F (36.7 C)     Temp Source 07/10/14 1824 Oral     SpO2 07/10/14 1421 95 %     Weight 07/10/14 1824 181 lb (82.101 kg)     Height 07/10/14 1824  (1.549 m)     Head Cir --      Peak Flow --      Pain Score 07/10/14 1422 0     Pain Loc --      Pain Edu? --      Excl. in GC? --      Physical Exam  Constitutional: He is  oriented to person, place, and time. He appears well-developed and well-nourished. He does not appear ill. No distress.  HENT:  Head: Normocephalic and atraumatic.  Nose: Nose normal.  Mouth/Throat: Oropharynx is clear and moist. No oropharyngeal exudate.  Eyes: EOM are normal. Pupils are equal, round, and reactive to light.  No nystagmus, unable to reproduce sx with rapid head movements.  Bilateral ear exam benign, hearing intact to finger rub b/l Full neck ROM, nontender  Neck: Normal range of motion. Neck supple.  Cardiovascular: Normal rate, regular rhythm, normal heart sounds and intact distal pulses.   No murmur heard. Pulmonary/Chest: Effort normal and breath sounds normal. No respiratory distress. He has no wheezes. He exhibits no tenderness.  Abdominal: Soft. He exhibits no distension. There is no tenderness. There is no guarding.  Musculoskeletal: Normal range of motion. He exhibits no tenderness.  Neurological: He is alert and oriented to person, place, and time. No cranial nerve deficit. Coordination normal.  Normal gait, negative romberg.  Good coordination testing   Skin: Skin is warm and dry. He is not diaphoretic. No pallor.  Psychiatric: He has a normal mood and affect. His behavior is normal. Judgment and thought content normal.  Nursing note and vitals reviewed.   ED Course  Procedures (including critical care time) Labs Review Labs Reviewed  CBC - Abnormal; Notable for the following:    Platelets 103 (*)    All other components within normal limits  BASIC METABOLIC PANEL - Abnormal; Notable for the following:    BUN 31 (*)    Creatinine, Ser 1.52 (*)    GFR calc non Af Amer 49 (*)    GFR calc Af Amer 57 (*)    All other components within normal limits  TROPONIN I  URINALYSIS, ROUTINE W REFLEX MICROSCOPIC  SODIUM, URINE, RANDOM  CREATININE, URINE, RANDOM    Imaging Review No results found.   EKG Interpretation   Date/Time:  Tuesday July 10 2014  14:52:24 EDT Ventricular Rate:  59 PR Interval:  190 QRS Duration: 101 QT Interval:  448 QTC Calculation: 444 R Axis:   38 Text Interpretation:  Sinus rhythm LVH with secondary repolarization  abnormality Baseline wander in lead(s) V2 V4 No significant change since  last tracing Confirmed by [facility]  MD, DAVID ([id number]) on 07/10/2014 3:02:28 PM      MDM   Final diagnoses:  Chronic hypertension  Vertigo  H/O medication noncompliance  AKI (acute kidney injury)   Pt is a 59 yo M with hx of HTN, HLD, CHF, and tobacco use who presents today due to HTN and room spinning dizziness for the past few months.  Reports he was seen last week and was prescribed a 2nd  medication for his HTN (already on coreg, and had lisinopril added).  Takes his coreg "most days" but has not been able to fill his lisinopril yet 2/2 cost.   He also complains of 4 months of intermittent room spinning dizziness.  Worsens with head movements, but occurs randomly.  Associated mild nausea, but no vomiting.  Afebrile. 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  Reports tinnitus after shows that lasts for several hours.  Denies any other neuro deficits including difficulty with extremity coordination, no difficulty with ataxia when walking, no weakness, numbness, or paresthesias.  Denies headache or sinus pressure.  BP elevated at 200/100s.  He reports that he took his coreg prior to arrival but didn't fill his lisinopril. Epic review shows that this is his baseline BP from his last visit too.   He will likely need increase in his BP med doses, but will hold off on this until he starts taking his lisinopril regularly first.  Patient was advised to take all his meds as prescribed.  Social Worker was consulted to help patient with resources for possible medication assistance.    Doubt central cause of his dizziness.  No nystagmus, normal coordination testing, steady gait, no reproducible room spinning with head movements.   Doubt central cause of his several months of intermittent dizziness.  He was given antivert in the ED for symptomatic control.   Will give NS bolus for AKI.  Urine shows that he is not spilling protein, but as his Cr has increased from 1.2 to 1.5 in the past week without any other cause for dehydration, there is some concern that his chronically elevated BP can be causing end organ damage.   Will get admitted to hospitalist for monitoring for BP and for IV hydration.   Case manager reports he can go to wellness clinic for medication assistance after discharge from hospital.  Needs 1 week of Rx and they can fill it next Wednesday.    Patient was seen with ED Attending, Dr. Sharl MaGlick  Francisco Ostrovsky, MD  Lenell AntuJamie Millena Callins, MD 07/11/14 [id number]  Dione Boozeavid Glick, MD 07/15/14 [phone]

## 2014-07-10 NOTE — H&P (Signed)
[facility] History and Physical  Steven RoutJeffrey Brady ZOX:[id number] DOB: [birth year] DOA: 07/10/2014  Referring physician: EDP PCP: No primary care provider on file.   Chief Complaint: sent to ED from wellness clinic for elevated BP  HPI: Steven RoutJeffrey Nieblas is a 59 y.o. male with prior h/o hypertensoin, hyperlipidemia, went to wellness clinic for follow up and he was found to have BP in 200/100's and was sent to ED,. In ED he was given one dose of  10 mg lisinopril and was referred to medical service for admission for accelerated hypertension and ARF. His labs revealed an new creatinine level of 1.52. Patient denies any other complaints except for constant dizziness since 2 to 3 weeks. He will be admitted to telemetry for further evaluation and management.   Review of Systems:  Constitutional:  No weight loss, night sweats, Fevers, chills, fatigue.  HEENT:  No headaches, Difficulty swallowing,Tooth/dental problems,Sore throat,  No sneezing, itching, ear ache, nasal congestion, post nasal drip,  Cardio-vascular:  No chest pain, Orthopnea, PND, swelling in lower extremities, anasarca, dizziness, palpitations  GI:  No heartburn, indigestion, abdominal pain, nausea, vomiting, diarrhea, change in bowel habits, loss of appetite  Resp:  No shortness of breath with exertion or at rest. No excess mucus, no productive cough, No non-productive cough, No coughing up of blood.No change in color of mucus.No wheezing.No chest wall deformity  Skin:  no rash or lesions.  GU:  no dysuria, change in color of urine, no urgency or frequency. No flank pain.  Musculoskeletal:  No joint pain or swelling. No decreased range of motion. No back pain.  Psych:  No change in mood or affect. No depression or anxiety. No memory loss.   Past Medical History  Diagnosis Date  . Hypertension   . 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    recently hurt left ankle - 2 weeks ago  . Hematuria - cause not known 06/08/11    pt has  been seeing blood off and on in urine  . Hyperlipidemia   . Tobacco abuse   . CHF (congestive heart failure)    Past Surgical History  Procedure Laterality Date  . No past surgeries     Social History:  reports that he has been smoking Cigarettes.  He has been smoking about 0.25 packs per day. He has never used smokeless tobacco. He reports that he uses illicit drugs (Marijuana). He reports that he does not drink alcohol.  No Known Allergies  Family History  Problem Relation Age of Onset  . Dementia Mother     do not leave blank  Prior to Admission medications   Medication Sig Start Date End Date Taking? Authorizing Provider  aspirin EC 81 MG tablet Take 81 mg by mouth daily.   Yes Historical Provider, MD  atorvastatin (LIPITOR) 40 MG tablet Take 40 mg by mouth daily. 03/04/14  Yes Historical Provider, MD  carvedilol (COREG) 12.5 MG tablet Take 12.5 mg by mouth 2 (two) times daily. 03/04/14  Yes Historical Provider, MD  dimenhyDRINATE (DRAMAMINE) 50 MG tablet Take 50 mg by mouth every 8 (eight) hours as needed for nausea.   Yes Historical Provider, MD  furosemide (LASIX) 40 MG tablet Take 40 mg by mouth daily.   Yes Historical Provider, MD  lisinopril (PRINIVIL,ZESTRIL) 10 MG tablet Take 1 tablet (10 mg total) by mouth daily. 07/02/14  Yes Renne CriglerJoshua Geiple, PA-C  Multiple Vitamins-Minerals (CENTRUM ADULTS PO) Take 1 tablet by mouth daily.   Yes Historical Provider, MD   Physical  Exam: Filed Vitals:   07/10/14 1700 07/10/14 1730 07/10/14 1735 07/10/14 1745  BP: 187/101 187/104 171/99 160/81  Pulse: 56 57 55 67  Temp:      Resp: SpO2: 95% 97% 96% 96%    Wt Readings from Last 3 Encounters:  07/10/14 82.101 kg (181 lb)  06/09/11 72 kg (158 lb 11.7 oz)    General:  Appears calm and comfortable Eyes: PERRL, normal lids, irises & conjunctiva ENT: grossly normal hearing, lips & tongue Neck: no LAD, masses or thyromegaly Cardiovascular: RRR, no m/r/g. No LE  edema. Respiratory: CTA bilaterally, no w/r/r. Normal respiratory effort. Abdomen: soft, ntnd Skin: no rash or induration seen on limited exam Musculoskeletal: grossly normal tone BUE/BLE Psychiatric: grossly normal mood and affect, speech fluent and appropriate Neurologic: grossly non-focal.          Labs on Admission:  Basic Metabolic Panel:  Recent Labs Lab 07/10/14 1435  NA 140  K 4.3  CL 102  CO2 29  GLUCOSE 93  BUN 31*  CREATININE 1.52*  CALCIUM 9.5   Liver Function Tests: No results for input(s): AST, ALT, ALKPHOS, BILITOT, PROT, ALBUMIN in the last 168 hours. No results for input(s): LIPASE, AMYLASE in the last 168 hours. No results for input(s): AMMONIA in the last 168 hours. CBC:  Recent Labs Lab 07/10/14 1435  WBC 6.7  HGB 16.9  HCT 49.5  MCV 89.2  PLT 103*   Cardiac Enzymes:  Recent Labs Lab 07/10/14 1435  TROPONINI <0.03    BNP (last 3 results)  Recent Labs  07/02/14 1035  BNP 76.4    ProBNP (last 3 results) No results for input(s): PROBNP in the last 8760 hours.  CBG: No results for input(s): GLUCAP in the last 168 hours.  Radiological Exams on Admission: No results found.  EKG:  Reviewed sinus   Assessment/Plan Active Problems:   AKI (acute kidney injury)   Hypertensive urgency   Accelerated hypertension: Admitted to telemetry. Stopped lisinopril, and lasix and started him on amlodipine and hydralazine. Resume coreg. As per the patient he reports being compliant to all medications. But he does not have insurance and said he cannot afford medications.    Dizziness: reports its constant and no episodes of syncope. Will get an MRI brain for further evaluation. Not related to head or neck movements, no nystagmus seen on exam.    Hyperlipidemia: Resume lipitor.   Acute renal failure: unclear etiology.  Will get urine studies, UA, . Probably from lasix and lisinopril.  Holding both of them and repeat the renal parameters in  am.    Code Status: full code.  DVT Prophylaxis: Family Communication: none at bedside Disposition Plan: admit to telemetry.  Time spent: 55 min  [facility] Pager [phone]

## 2014-07-11 ENCOUNTER — Inpatient Hospital Stay (HOSPITAL_COMMUNITY): Payer: Medicaid Other

## 2014-07-11 DIAGNOSIS — I639 Cerebral infarction, unspecified: Secondary | ICD-10-CM

## 2014-07-11 DIAGNOSIS — E785 Hyperlipidemia, unspecified: Secondary | ICD-10-CM

## 2014-07-11 DIAGNOSIS — Z720 Tobacco use: Secondary | ICD-10-CM

## 2014-07-11 NOTE — Progress Notes (Signed)
   Subjective:    Patient ID: Steven Brady, male    DOB: 1956/01/20, 59 y.o.   MRN: [id number]  HPI  Steven Brady comes in today complaining of feeling dizzy, left arm numbness and feeling unwell; his blood pressure is noted to be elevated at 200/118 and he has not been able to pick up his lisinopril which he received at the ED last week.   Past Medical History  Diagnosis Date  . Hypertension   . 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    recently hurt left ankle - 2 weeks ago  . Hematuria - cause not known 06/08/11    pt has been seeing blood off and on in urine  . Hyperlipidemia   . Tobacco abuse   . CHF (congestive heart failure)     Past Surgical History  Procedure Laterality Date  . No past surgeries      History   Social History  . Marital Status: Married    Spouse Name: N/A  . Number of Children: N/A  . Years of Education: N/A   Occupational History  . Not on file.   Social History Main Topics  . Smoking status: Current Every Day Smoker -- 0.25 packs/day    Types: Cigarettes  . Smokeless tobacco: Never Used  . Alcohol Use: No  . Drug Use: Yes    Special: Marijuana  . Sexual Activity: Not on file   Other Topics Concern  . Not on file   Social History Narrative    No Known Allergies  No current facility-administered medications on file prior to visit.   Current Outpatient Prescriptions on File Prior to Visit  Medication Sig Dispense Refill  . aspirin EC 81 MG tablet Take 81 mg by mouth daily.    Marland Kitchen. atorvastatin (LIPITOR) 40 MG tablet Take 40 mg by mouth daily.    . carvedilol (COREG) 12.5 MG tablet Take 12.5 mg by mouth 2 (two) times daily.    Marland Kitchen. dimenhyDRINATE (DRAMAMINE) 50 MG tablet Take 50 mg by mouth every 8 (eight) hours as needed for nausea.    . Multiple Vitamins-Minerals (CENTRUM ADULTS PO) Take 1 tablet by mouth daily.    Marland Kitchen. lisinopril (PRINIVIL,ZESTRIL) 10 MG tablet Take 1 tablet (10 mg total) by mouth daily. 30 tablet 0        Review of  Systems  Constitutional: Positive for fatigue.  Respiratory: Negative for cough and shortness of breath.   Cardiovascular: Negative for chest pain and palpitations.  Neurological: Positive for dizziness and numbness.         Objective:   Physical Exam  Constitutional: He is oriented to person, place, and time. He appears well-developed and well-nourished.  Neck: Normal range of motion. No JVD present.  Cardiovascular: Regular rhythm.  Bradycardia present.   Pulmonary/Chest: Effort normal and breath sounds normal.  Abdominal: Soft. Bowel sounds are normal.  Neurological: He is alert and oriented to person, place, and time.          Assessment & Plan:  59 year old patient with hypertensive emergency, currently bradycardic and symptomatic and I am unable to administer an antihypertensive as we have in the clinic clonidine and labetalol.  Accelerated hypertension: Symptomatic EKG done reveals sinus bradycardia with a rate of 50, left ventricular hypertrophy with repolarization. Nonemergent EMS called and patient will be transported to the ED STAT He will need a close follow up once discharged from the ED

## 2014-07-11 NOTE — Progress Notes (Signed)
Utilization review completed. Finch Costanzo, RN, BSN. 

## 2014-07-11 NOTE — Progress Notes (Signed)
Physical Therapy Vestibular Assessment   07/11/14 1708  Symptom Behavior  Type of Dizziness "World moves"  Frequency of Dizziness 2-3x/day  Duration of Dizziness 5-15 minutes  Aggravating Factors Turning head quickly;Forward bending;Sitting in moving car  Relieving Factors Closing eyes  Occulomotor Exam  Occulomotor Alignment Normal  Spontaneous Absent  Gaze-induced Absent  Head shaking Horizontal Absent (but symptomatic 8/10)  Head Shaking Vertical Absent (but symptomatic 7/10)  Smooth Pursuits Intact  Saccades Intact (symptomatic 8/10)  Vestibulo-Occular Reflex  VOR 1 Head Only (x 1 viewing) tolerates only about 15 seconds horizontal and 7 seconds vertical; able to maintain target, but symptomatic  VOR to Slow Head Movement Positive bilaterally;Comment (pt symptomatic so unable to maintain target)  VOR Cancellation Normal  Auditory  Comments intact to scratch test, but reports tinnitis at times in both ears  Other Tests  Tragal negative  Comments val salva negative  Positional Testing  Sidelying Test Sidelying Right;Sidelying Left  Horizontal Canal Testing Horizontal Canal Right;Horizontal Canal Left  Sidelying Right  Sidelying Right Duration 30 sec  Sidelying Right Symptoms No nystagmus;Other (comment) (but symptomatic when in position and worse returning to upri)  Sidelying Left  Sidelying Left Duration 30 sec  Sidelying Left Symptoms No nystagmus;Other (comment) (but symptomatic not in position but coming upright)  Horizontal Canal Right  Horizontal Canal Right Duration 30 sec  Horizontal Canal Right Symptoms Normal  Horizontal Canal Left  Horizontal Canal Left Duration 30 sec  Horizontal Canal Left Symptoms Normal     Provided education to patient regarding potential causes for symptoms and importance of knowing difference as stroke, aggravated HTN and other serious issues can cause dizziness.  Educated on vestibular rehab and plan for recommending outpatient  rehab.  Pt [facility]  [place] Lakesyndi Wynn, [place] CarolinaPT [phone] 07/11/2014

## 2014-07-11 NOTE — Evaluation (Signed)
Physical Therapy Vestibular Evaluation Patient Details Name: Steven Brady MRN: [id number] DOB: 02/26/1956 Today's Date: 07/11/2014   History of Present Illness  Patient admitted with prior h/o hypertensoin, hyperlipidemia, admited for accelerated hypertension and ARF. His labs revealed an new creatinine level of 1.52. Patient denies any other complaints except for constant dizzinss since 2 to 3 weeks  Clinical Impression  Patient presents potentially with symptoms of vestibular disorder potentially vestibular neuritis versus Meniere's disease and may benefit from follow up skilled PT in the outpatient setting to further assess balance and educate and see if vestibular rehab can improve symptoms.  Patient does relate falling due to symptoms and at times is limited in ADL's.  Will not follow up acutely, but have informed case manager pt appropriate for referral to outpatient rehab for vestibular rehab.  PLEASE SEE NEXT NOTE FOR VESTIBULAR EVAL.    Follow Up Recommendations Outpatient PT (neruorehab)    Equipment Recommendations  None recommended by PT    Recommendations for Other Services       Precautions / Restrictions Precautions Precautions: None Precaution Comments: states one fall related to dizziness      Mobility  Bed Mobility Overal bed mobility: Independent                Transfers Overall transfer level: Independent                  Ambulation/Gait             General Gait Details: deferred due to focus on vestibular eval  Stairs            Wheelchair Mobility    Modified Rankin (Stroke Patients Only)       Balance                                             Pertinent Vitals/Pain Pain Assessment: Faces Faces Pain Scale: Hurts a little bit Pain Location: posterior neck Pain Descriptors / Indicators: Aching Pain Intervention(s): Monitored during session    Home Living Family/patient expects to be  discharged to:: Private residence                      Prior Function Level of Independence: Independent               Hand Dominance        Extremity/Trunk Assessment               Lower Extremity Assessment: Overall WFL for tasks assessed         Communication   Communication: No difficulties  Cognition Arousal/Alertness: Awake/alert Behavior During Therapy: WFL for tasks assessed/performed Overall Cognitive Status: Within Functional Limits for tasks assessed                      General Comments      Exercises        Assessment/Plan    PT Assessment All further PT needs can be met in the next venue of care  PT Diagnosis Other (comment) (dizziness and giddiness)   PT Problem List Decreased balance;Other (comment) (vertigo)  PT Treatment Interventions     PT Goals (Current goals can be found in the Care Plan section) Acute Rehab PT Goals PT Goal Formulation: All assessment and education complete, DC therapy    Frequency       Barriers to discharge        Co-evaluation               End of Session   Activity Tolerance: Patient tolerated treatment well Patient left: in bed      Functional Assessment Tool Used: clinical judgement Functional Limitation: Self care Self Care Current Status (O0370): At least 20 percent but less than 40 percent impaired, limited or restricted Self Care Goal Status (W8889): At least 1 percent but less than 20 percent impaired, limited or restricted Self Care Discharge Status [phone]): At least 1 percent but less than 20 percent impaired, limited or restricted    Time: 0388-8280 PT Time Calculation (min) (ACUTE ONLY): 30 min   Charges:   PT Evaluation $Initial PT Evaluation Tier I: 1 Procedure PT Treatments $Self Care/Home Management: 8-22   PT G Codes:   PT G-Codes **NOT FOR INPATIENT CLASS** Functional Assessment Tool Used: clinical judgement Functional Limitation: Self care Self  Care Current Status (K3491): At least 20 percent but less than 40 percent impaired, limited or restricted Self Care Goal Status (P9150): At least 1 percent but less than 20 percent impaired, limited or restricted Self Care Discharge Status [phone]): At least 1 percent but less than 20 percent impaired, limited or restricted    [facility] 07/11/2014, 5:17 PM  Magda Kiel, [place] 07/11/2014

## 2014-07-12 DIAGNOSIS — R42 Dizziness and giddiness: Secondary | ICD-10-CM | POA: Insufficient documentation

## 2014-07-12 DIAGNOSIS — Z9114 Patient's other noncompliance with medication regimen: Secondary | ICD-10-CM | POA: Insufficient documentation

## 2014-07-12 DIAGNOSIS — Z9119 Patient's noncompliance with other medical treatment and regimen: Secondary | ICD-10-CM

## 2014-07-12 DIAGNOSIS — I6381 Other cerebral infarction due to occlusion or stenosis of small artery: Secondary | ICD-10-CM | POA: Insufficient documentation

## 2014-07-12 LAB — BASIC METABOLIC PANEL
Anion gap: 8 (ref 5–15)
BUN: 19 mg/dL (ref 6–23)
CALCIUM: 8.6 mg/dL (ref 8.4–10.5)
CO2: 23 mmol/L (ref 19–32)
Chloride: 109 mmol/L (ref 96–112)
Creatinine, Ser: 1.14 mg/dL (ref 0.50–1.35)
GFR calc Af Amer: 81 mL/min — ABNORMAL LOW (ref >90)
GFR, EST NON AFRICAN AMERICAN: 70 mL/min — AB (ref >90)
GLUCOSE: 103 mg/dL — AB (ref 70–99)
Potassium: 4.1 mmol/L (ref 3.5–5.1)
SODIUM: 140 mmol/L (ref 135–145)

## 2014-07-12 MED ORDER — AMLODIPINE BESYLATE 10 MG PO TABS: 10.0000 mg | ORAL_TABLET | Freq: Every day | ORAL | Status: DC

## 2014-07-12 MED ORDER — CARVEDILOL 12.5 MG PO TABS: 12.5000 mg | ORAL_TABLET | Freq: Two times a day (BID) | ORAL | Status: DC

## 2014-07-12 MED ORDER — AMLODIPINE BESYLATE 5 MG PO TABS: 5.0000 mg | ORAL_TABLET | Freq: Every day | ORAL | Status: DC

## 2014-07-12 MED ORDER — AMLODIPINE BESYLATE 5 MG PO TABS
5.0000 mg | ORAL_TABLET | ORAL | Status: AC
  Administered 2014-07-12: 5 mg via ORAL
  Filled 2014-07-12: qty 1

## 2014-07-12 MED ORDER — HYDRALAZINE HCL 25 MG PO TABS: 25.0000 mg | ORAL_TABLET | Freq: Three times a day (TID) | ORAL | Status: DC

## 2014-07-12 MED ORDER — MECLIZINE HCL 25 MG PO TABS: 25.0000 mg | ORAL_TABLET | Freq: Two times a day (BID) | ORAL | Status: DC

## 2014-07-12 MED ORDER — UNABLE TO FIND: Status: DC

## 2014-07-12 NOTE — Progress Notes (Signed)
Notified Kirby NP of patient's manual BP of 164/78, given the okay to discharge. New orders received. Patient educated on new orders. Patient IV removed. Notified CCMD of patient being removed from telemetry. Patient received prescriptions and forms. Given opportunities to answer questions. Patient escorted to exit.

## 2014-07-12 NOTE — Plan of Care (Addendum)
Pt scheduled for discharge tonight. RN took pt's BP and was high, so she gave hydralazine 10mg  IV as directed by prn orders. Afterwards, RN paged this NP stating pt's BP was still 171/108 1.5 hrs after hydralazine. This NP reviewed meds and d/c summary. Asked RN to give Norvasc 5mg  now and will change Norvasc from 5 to 10mg  po qd starting in the am. New Rx sent to pharmacy. Asked RN to wait 30 mins, take BP manually, and txt page results to this NP. Will decide then if pt is able to go home.  Pt here for accelerated HTN so not wanting to get his BP down to normal now anyway. Would want meds increased over outpt visits until reach normal BP.  Jimmye NormanKaren Kirby-Graham, NP [facility] Update: After extra 5mg  Norvasc and waiting awhile, BP down to 164/78. Reminded RN to tell pt that Norvasc would now be 10mg  starting in the am. Asked RN to tell pt to go to PCP's office in 2 days and get BP checked. Pt previously instructed to make appt with PCP in a week per discharge summary by attending.  KJKG, NP

## 2014-07-12 NOTE — Care Management Note (Signed)
CARE MANAGEMENT NOTE 07/12/2014  Patient:  Steven Brady,Steven Brady   Account Number:  [id number]  Date Initiated:  07/11/2014  Documentation initiated by:  Cole Klugh  Subjective/Objective Assessment:   CM following for progression and d/c planning.     Action/Plan:   07/11/2014 Noted consult for medication assistance, pt is active with [facility] and [facility] and has access to that pharmacy. Will await prescriptions to determine if [facility] letter is needed.   Anticipated DC Date:  07/12/2014   Anticipated DC Plan:  [place] [facility]      Choice offered to / List presented to:             Status of service:  Completed, signed off Medicare Important Message given?  NO (If response is "NO", the following Medicare IM given date fields will be blank) Date Medicare IM given:   Medicare IM given by:   Date Additional Medicare IM given:   Additional Medicare IM given by:    Discharge Disposition:  HOME/SELF CARE  Per UR Regulation:    If discussed at Long Length of Stay Meetings, dates discussed:    Comments:  07/12/2014 Met with pt , who is unable to purchase medications, [facility] letter given , and confirmed appointment for followup at [facility] for Monday, July 16, 2014 @ 2pm, and pt informed a appointment time.  CRoyal RN MPH, case manager, [phone]

## 2014-07-12 NOTE — Discharge Instructions (Signed)
Acute Kidney Injury °Acute kidney injury is a disease in which there is sudden (acute) damage to the kidneys. The kidneys are 2 organs that lie on either side of the spine between the middle of the back and the front of the abdomen. The kidneys: °· Remove wastes and extra water from the blood.   °· Produce important hormones. These help keep bones strong, regulate blood pressure, and help create red blood cells.   °· Balance the fluids and chemicals in the blood and tissues. °A small amount of kidney damage may not cause problems, but a large amount of damage may make it difficult or impossible for the kidneys to work the way they should. Acute kidney injury may develop into long-lasting (chronic) kidney disease. It may also develop into a life-threatening disease called end-stage kidney disease. Acute kidney injury can get worse very quickly, so it should be treated right away. Early treatment may prevent other kidney diseases from developing. ° °CAUSES  °· A problem with blood flow to the kidneys. This may be caused by:   °¨ Blood loss.   °¨ Heart disease.   °¨ Severe burns.   °¨ Liver disease. °· Direct damage to the kidneys. This may be caused by: °¨ Some medicines.   °¨ A kidney infection.   °¨ Poisoning or consuming toxic substances.   °¨ A surgical wound.   °¨ A blow to the kidney area.   °· A problem with urine flow. This may be caused by:   °¨ Cancer.   °¨ Kidney stones.   °¨ An enlarged prostate. °SYMPTOMS  °· Swelling (edema) of the legs, ankles, or feet.   °· Tiredness (lethargy).   °· Nausea or vomiting.   °· Confusion.   °· Problems with urination, such as:   °¨ Painful or burning feeling during urination.   °¨ Decreased urine production.   °¨ Frequent accidents in children who are potty trained.   °¨ Bloody urine.   °· Muscle twitches and cramps.   °· Shortness of breath.   °· Seizures.   °· Chest pain or pressure. °Sometimes, no symptoms are present.  °DIAGNOSIS °Acute kidney injury may be detected  and diagnosed by tests, including blood, urine, imaging, or kidney biopsy tests.  °TREATMENT °Treatment of acute kidney injury varies depending on the cause and severity of the kidney damage. In mild cases, no treatment may be needed. The kidneys may heal on their own. If acute kidney injury is more severe, your caregiver will treat the cause of the kidney damage, help the kidneys heal, and prevent complications from occurring. Severe cases may require a procedure to remove toxic wastes from the body (dialysis) or surgery to repair kidney damage. Surgery may involve:  °· Repair of a torn kidney.   °· Removal of an obstruction. °Most of the time, you will need to stay overnight at the hospital.  °HOME CARE INSTRUCTIONS: °· Follow your prescribed diet. °· Only take over-the-counter or prescription medicines as directed by your caregiver.  °· Do not take any new medicines (prescription, over-the-counter, or nutritional supplements) unless approved by your caregiver. Many medicines can worsen your kidney damage or need to have the dose adjusted.   °· Keep all follow-up appointments as directed by your caregiver. °· Observe your condition to make sure you are healing as expected. °SEEK IMMEDIATE MEDICAL CARE IF: °· You are feeling ill or have severe pain in the back or side.   °· Your symptoms return or you have new symptoms. °· You have any symptoms of end-stage kidney disease. These include:   °¨ Persistent itchiness.   °¨ Loss of appetite.   °¨ Headaches.   °¨ Abnormally dark   or light skin. °¨ Numbness in the hands or feet.   °¨ Easy bruising.   °¨ Frequent hiccups.   °¨ Menstruation stops.   °· You have a fever. °· You have increased urine production. °· You have pain or bleeding when urinating. °MAKE SURE YOU:  °· Understand these instructions. °· Will watch your condition. °· Will get help right away if you are not doing well or get worse °Document Released: 09/22/2010 Document Revised: 07/04/2012 Document  Reviewed: 11/06/2011 °ExitCare® Patient Information ©2015 [employer]. This information is not intended to replace advice given to you by your health care provider. Make sure you discuss any questions you have with your health care provider. ° °Acute Kidney Injury °Acute kidney injury is a disease in which there is sudden (acute) damage to the kidneys. The kidneys are 2 organs that lie on either side of the spine between the middle of the back and the front of the abdomen. The kidneys: °· Remove wastes and extra water from the blood.   °· Produce important hormones. These help keep bones strong, regulate blood pressure, and help create red blood cells.   °· Balance the fluids and chemicals in the blood and tissues. °A small amount of kidney damage may not cause problems, but a large amount of damage may make it difficult or impossible for the kidneys to work the way they should. Acute kidney injury may develop into long-lasting (chronic) kidney disease. It may also develop into a life-threatening disease called end-stage kidney disease. Acute kidney injury can get worse very quickly, so it should be treated right away. Early treatment may prevent other kidney diseases from developing. ° °CAUSES  °· A problem with blood flow to the kidneys. This may be caused by:   °¨ Blood loss.   °¨ Heart disease.   °¨ Severe burns.   °¨ Liver disease. °· Direct damage to the kidneys. This may be caused by: °¨ Some medicines.   °¨ A kidney infection.   °¨ Poisoning or consuming toxic substances.   °¨ A surgical wound.   °¨ A blow to the kidney area.   °· A problem with urine flow. This may be caused by:   °¨ Cancer.   °¨ Kidney stones.   °¨ An enlarged prostate. °SYMPTOMS  °· Swelling (edema) of the legs, ankles, or feet.   °· Tiredness (lethargy).   °· Nausea or vomiting.   °· Confusion.   °· Problems with urination, such as:   °¨ Painful or burning feeling during urination.   °¨ Decreased urine production.   °¨ Frequent  accidents in children who are potty trained.   °¨ Bloody urine.   °· Muscle twitches and cramps.   °· Shortness of breath.   °· Seizures.   °· Chest pain or pressure. °Sometimes, no symptoms are present.  °DIAGNOSIS °Acute kidney injury may be detected and diagnosed by tests, including blood, urine, imaging, or kidney biopsy tests.  °TREATMENT °Treatment of acute kidney injury varies depending on the cause and severity of the kidney damage. In mild cases, no treatment may be needed. The kidneys may heal on their own. If acute kidney injury is more severe, your caregiver will treat the cause of the kidney damage, help the kidneys heal, and prevent complications from occurring. Severe cases may require a procedure to remove toxic wastes from the body (dialysis) or surgery to repair kidney damage. Surgery may involve:  °· Repair of a torn kidney.   °· Removal of an obstruction. °Most of the time, you will need to stay overnight at the hospital.  °HOME CARE INSTRUCTIONS: °· Follow your prescribed diet. °· Only take over-the-counter or prescription   medicines as directed by your caregiver.  °· Do not take any new medicines (prescription, over-the-counter, or nutritional supplements) unless approved by your caregiver. Many medicines can worsen your kidney damage or need to have the dose adjusted.   °· Keep all follow-up appointments as directed by your caregiver. °· Observe your condition to make sure you are healing as expected. °SEEK IMMEDIATE MEDICAL CARE IF: °· You are feeling ill or have severe pain in the back or side.   °· Your symptoms return or you have new symptoms. °· You have any symptoms of end-stage kidney disease. These include:   °¨ Persistent itchiness.   °¨ Loss of appetite.   °¨ Headaches.   °¨ Abnormally dark or light skin. °¨ Numbness in the hands or feet.   °¨ Easy bruising.   °¨ Frequent hiccups.   °¨ Menstruation stops.   °· You have a fever. °· You have increased urine production. °· You have pain  or bleeding when urinating. °MAKE SURE YOU:  °· Understand these instructions. °· Will watch your condition. °· Will get help right away if you are not doing well or get worse °Document Released: 09/22/2010 Document Revised: 07/04/2012 Document Reviewed: 11/06/2011 °ExitCare® Patient Information ©2015 [employer]. This information is not intended to replace advice given to you by your health care provider. Make sure you discuss any questions you have with your health care provider. ° °

## 2014-07-12 NOTE — Discharge Summary (Signed)
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 Will discharge home and he will follow with PCP in 5 days  Diet recommendation: heart healthy/low sodium diet  Filed Weights   07/10/14 1824 07/10/14 2159  Weight: 82.101 kg (181 lb) 84.6 kg (186 lb 8.2 oz)    History of present illness:  59 y.o. male with prior h/o hypertensoin, hyperlipidemia, went to wellness clinic for follow up and he was found to have BP in 200/100's and was sent to ED,. In ED he was given one dose of 10 mg lisinopril and was referred to medical service for admission for accelerated hypertension and ARF. His labs revealed an new creatinine level of 1.52. Patient denies any other complaints except for constant dizziness when changing position; symptoms worse since 2 to 3 weeks.   Hospital Course:  1-accelerated HTN: due to diet indiscretion and lack of medication compliance -troponin neg -BP responded well to use of amlodipine, coreg and hydralazine -lisinopril and diuretics remained on hold due to renal failure at presentation  -advise to follow low sodium diet -close outpatient follow up for further medication adjustments as needed  2-lacunar infarcts: due to small vessel disease especially uncontrolled HTN -will control BP -continue ASA for secondary preventions -continue statins   -patient advise to quit smoking   3-dizziness: will ask PT to see and evaluate for vestibular training -positive vestibular changes appreciated with PT assessment -will discharge on meclizine -patient to follow with PT as an outpatient for vestibular rehab  4-tobacco abuse: cessation counseling provided -patient is motivated to quit  5-acute on chronic renal failure: stage 2 at baseline -offensive agents on hold at discharge -IVF's given and Cr back to baseline at discharge -will follow BMET during follow up visit  6-HLD: continue statins  Procedures:  See below for x-ray reports   Consultations:  None   Discharge Exam: Filed Vitals:   07/12/14 1601  BP: 161/85  Pulse: 75  Temp: 98.4 F (36.9 C)  Resp: 18    General: Afebrile, no CP or SOB. Still having mild dizziness when changing position   Cardiovascular: mild bradycardia, S1 and S2 appreciated; no rubs or gallops  Respiratory: CTA bilaterally  Abdomen: soft, NT, positive BS  Musculoskeletal: no cyanosis or clubbing   Discharge Instructions   Discharge Instructions    Ambulatory referral to Physical Therapy    Complete by:  As directed   Vestibular training     Diet - low sodium heart healthy    Complete by:  As directed      Discharge instructions    Complete by:  As directed   Stop smoking Take medications as prescribed Follow low sodium diet (less than 2.5 grams per day) Follow up with PCP in 1 week Maintain adequate hydration          Current Discharge Medication List    START taking these medications   Details  amLODipine (NORVASC) 5 MG tablet Take 1 tablet (5 mg total) by  mouth daily. Qty: 30 tablet, Refills: 1    hydrALAZINE (APRESOLINE) 25 MG tablet Take 1 tablet (25 mg total) by mouth every 8 (eight) hours. Qty: 90 tablet, Refills: 1    meclizine (ANTIVERT) 25 MG tablet Take 1 tablet (25 mg total) by mouth 2 (two) times daily. Qty: 60 tablet, Refills: 1      CONTINUE these  medications which have CHANGED   Details  carvedilol (COREG) 12.5 MG tablet Take 1 tablet (12.5 mg total) by mouth 2 (two) times daily. Qty: 60 tablet, Refills: 1      CONTINUE these medications which have NOT CHANGED   Details  aspirin EC 81 MG tablet Take 81 mg by mouth daily.    atorvastatin (LIPITOR) 40 MG tablet Take 40 mg by mouth daily.    Multiple Vitamins-Minerals (CENTRUM ADULTS PO) Take 1 tablet by mouth daily.      STOP taking these medications     dimenhyDRINATE (DRAMAMINE) 50 MG tablet      furosemide (LASIX) 40 MG tablet      lisinopril (PRINIVIL,ZESTRIL) 10 MG tablet        No Known Allergies Follow-up Information    Follow up with [place]     On 07/16/2014.   Why:  Follow up Appointment at the [facility] Monday 4/25 at 2p with Dr. Carleene Overlie information:   [address] [place] [address] [phone]      The results of significant diagnostics from this hospitalization (including imaging, microbiology, ancillary and laboratory) are listed below for reference.    Significant Diagnostic Studies: Dg Chest 2 View  07/02/2014   CLINICAL DATA:  Sharp pain under left arm, shortness of breath  EXAM: CHEST  2 VIEW  COMPARISON:  06/08/2011  FINDINGS: The heart size and mediastinal contours are within normal limits. Both lungs are clear. The visualized skeletal structures are unremarkable.  IMPRESSION: No active cardiopulmonary disease.   Electronically Signed   By: Alcide Clever M.D.   On: 07/02/2014 12:01   Mr Brain Wo Contrast  07/11/2014   CLINICAL DATA:  Dizziness. Hypertension, CHF, acute kidney injury, hyperlipidemia.  EXAM: MRI HEAD WITHOUT CONTRAST  TECHNIQUE: Multiplanar, multiecho pulse sequences of the brain and surrounding structures were obtained without intravenous contrast.  COMPARISON:  CT of the head August 25, 2009  FINDINGS: The ventricles and sulci are normal for  patient's age. No abnormal parenchymal signal, mass lesions, mass effect. No reduced diffusion to suggest acute ischemia. No susceptibility artifact to suggest hemorrhage. Confluent supratentorial and pontine T2 hyperintense signal with focal cystic subcentimeter white matter infarcts. RIGHT basal ganglion and bilateral thalamic subcentimeter remote lacunar infarcts.  No abnormal extra-axial fluid collections. No extra-axial masses though, contrast enhanced sequences would be more sensitive. Normal major intracranial vascular flow voids seen at the skull base.  Ocular globes and orbital contents are unremarkable though not tailored for evaluation. No abnormal sellar expansion. RIGHT maxillary mucosal retention cyst without paranasal sinus air-fluid levels. 12 mm bright T2, low T1 cyst arising from the superior aspect of the RIGHT external auditory canal, recommend direct inspection. No suspicious calvarial bone marrow signal. No abnormal sellar expansion. Craniocervical junction maintained.  IMPRESSION: No acute intracranial process, specifically no acute ischemia.  Moderate to severe white matter changes likely represent chronic small vessel ischemic disease, advanced for age.  Remote RIGHT basal ganglia, bilateral thalamic lacunar infarcts.   Electronically Signed   By: Pernell Dupre  Bloomer   On: 07/11/2014 05:29   Labs: Basic Metabolic Panel:  Recent Labs Lab 07/10/14 1435 07/12/14 0717  NA 140 140  K 4.3 4.1  CL 102 109  CO2 29 23  GLUCOSE 93 103*  BUN 31* 19  CREATININE 1.52* 1.14  CALCIUM 9.5 8.6   CBC:  Recent Labs Lab 07/10/14 1435  WBC 6.7  HGB 16.9  HCT 49.5  MCV 89.2  PLT 103*   Cardiac Enzymes:  Recent Labs Lab 07/10/14 1435  TROPONINI <0.03   BNP: BNP (last 3 results)  Recent Labs  07/02/14 1035  BNP 76.4    Signed:  Vassie Loll  [facility] 07/12/2014, 4:47 PM

## 2014-07-12 NOTE — Progress Notes (Addendum)
  Pt's d/c instructions were reviewed and copy of instructions and scripts given to pt. Pt has match letter for meds and given $$ for his scripts provided by case manager.    Pt BP up, 193/95. Pt asked for BP to be checked, pt stated his ears felt hot and his posterior neck ached,(symptoms he has had before). Scheduled coreg given. Has prn IV hydralazine, Dr Gwenlyn PerkingMadera notified and is okay with pt receiving hydralazine IV at this time. Pt to take pm dose of po hydralazine tonight. Pt will continue to take meds as prescribed for hypertension and follow up with PCP. Pt's ride to go home will not be here for another 1-2 hours pt states, will give IV hydralazine and reassess BP.  Oncoming RN will be informed to f/u with MD if needed per MD request.

## 2014-07-12 NOTE — Progress Notes (Signed)
[facility] PROGRESS NOTE  Steven Brady ZOX:[id number] DOB: 02/05/1956 DOA: 07/10/2014 PCP: Steven Shaggy, MD  Assessment/Plan: 1-accelerated HTN: due to diet indiscretion and lack of medication compliance -troponin neg -BP responded well to use of amlodipine, coreg and hydralazine -lisinopril and diuretics on hold due to renal failure  -advise to follow low sodium diet  2-lacunar infarcts: due to small vessel disease especially uncontrolled HTN -will control BP -continue ASA for secondary preventions -continue statins   3-dizziness: will ask PT to see and evaluate for vestibular training -PRN meclizine  4-tobacco abuse: cessation counseling provided -patient is motivated to quit  5-acute on chronic renal failure: stage 2 at baseline -offensive agents on hold -IVF's given -will follow BMET in am  6-HLD: continue statins  Code Status: Full Family Communication: no family at bedside Disposition Plan: home, most likely in the next 1-2 days   Consultants:  None   Procedures:  See below for x-ray reports   Antibiotics:  None   HPI/Subjective: No fever. Denies CP or SOB. Complaining of dizzy spells when changing positions  Objective: Filed Vitals:   07/11/14 2100  BP: 143/69  Pulse: 57  Temp: 98.5 F (36.9 C)  Resp: 18    Intake/Output Summary (Last 24 hours) at 07/12/14 0031 Last data filed at 07/11/14 1852  Gross per 24 hour  Intake    760 ml  Output    750 ml  Net     10 ml   Filed Weights   07/10/14 1824 07/10/14 2159  Weight: 82.101 kg (181 lb) 84.6 kg (186 lb 8.2 oz)    Exam:   General:  Afebrile, no CP or SOB. Complaining of ear ringing and dizziness when changing positions. No nausea or vomiting  Cardiovascular: mild bradycardia, S1 and S2 appreciated; no rubs or gallops  Respiratory: CTA bilaterally  Abdomen: soft, NT, positive BS  Musculoskeletal: no cyanosis or clubbing   Data Reviewed: Basic Metabolic  Panel:  Recent Labs Lab 07/10/14 1435  NA 140  K 4.3  CL 102  CO2 29  GLUCOSE 93  BUN 31*  CREATININE 1.52*  CALCIUM 9.5   CBC:  Recent Labs Lab 07/10/14 1435  WBC 6.7  HGB 16.9  HCT 49.5  MCV 89.2  PLT 103*   Cardiac Enzymes:  Recent Labs Lab 07/10/14 1435  TROPONINI <0.03   BNP (last 3 results)  Recent Labs  07/02/14 1035  BNP 76.4    Studies: Mr Brain Wo Contrast  07/11/2014   CLINICAL DATA:  Dizziness. Hypertension, CHF, acute kidney injury, hyperlipidemia.  EXAM: MRI HEAD WITHOUT CONTRAST  TECHNIQUE: Multiplanar, multiecho pulse sequences of the brain and surrounding structures were obtained without intravenous contrast.  COMPARISON:  CT of the head August 25, 2009  FINDINGS: The ventricles and sulci are normal for patient's age. No abnormal parenchymal signal, mass lesions, mass effect. No reduced diffusion to suggest acute ischemia. No susceptibility artifact to suggest hemorrhage. Confluent supratentorial and pontine T2 hyperintense signal with focal cystic subcentimeter white matter infarcts. RIGHT basal ganglion and bilateral thalamic subcentimeter remote lacunar infarcts.  No abnormal extra-axial fluid collections. No extra-axial masses though, contrast enhanced sequences would be more sensitive. Normal major intracranial vascular flow voids seen at the skull base.  Ocular globes and orbital contents are unremarkable though not tailored for evaluation. No abnormal sellar expansion. RIGHT maxillary mucosal retention cyst without paranasal sinus air-fluid levels. 12 mm bright T2, low T1 cyst arising from the superior aspect of the RIGHT external auditory  canal, recommend direct inspection. No suspicious calvarial bone marrow signal. No abnormal sellar expansion. Craniocervical junction maintained.  IMPRESSION: No acute intracranial process, specifically no acute ischemia.  Moderate to severe white matter changes likely represent chronic small vessel ischemic disease,  advanced for age.  Remote RIGHT basal ganglia, bilateral thalamic lacunar infarcts.   Electronically Signed   By: Steven Metroourtnay  Brady   On: 07/11/2014 05:29    Scheduled Meds: . amLODipine  5 mg Oral Daily  . aspirin EC  81 mg Oral Daily  . atorvastatin  40 mg Oral q1800  . carvedilol  12.5 mg Oral BID  . enoxaparin (LOVENOX) injection  40 mg Subcutaneous Q24H  . hydrALAZINE  25 mg Oral 3 times per day   Continuous Infusions: . sodium chloride 50 mL/hr (07/11/14 2024)    Active Problems:   AKI (acute kidney injury)   Hypertensive urgency    Time spent: 30 minutes    Steven Brady, Steven Brady  [facility] Pager [phone]. If 7PM-7AM, please contact night-coverage at [email or web address], password [email or web address] 07/12/2014, 12:31 AM  LOS: 2 days

## 2014-07-12 NOTE — Progress Notes (Signed)
Notified Kirby NP of patient's BP at 171/108 after administration of hydralazine 10 mg IV at 1836. Patient has been scheduled to be discharged, orders received by Craige CottaKirby NP.

## 2014-07-12 NOTE — Clinical Social Work Note (Signed)
CSW received consult for medication assistance. CSW collaborated with nurse case manager Steven Brady who is working with him on this request.  Steven Brady, MSW, LCSW Licensed Clinical Social Worker Clinical Social Work Department [facility] [phone]

## 2014-07-16 ENCOUNTER — Encounter: Payer: Self-pay | Admitting: Family Medicine

## 2014-07-16 ENCOUNTER — Ambulatory Visit: Payer: Self-pay | Attending: Family Medicine | Admitting: Family Medicine

## 2014-07-16 VITALS — BP 137/78 | HR 76 | Temp 98.2°F | Resp 18 | Ht 68.0 in | Wt 190.0 lb

## 2014-07-16 DIAGNOSIS — R635 Abnormal weight gain: Secondary | ICD-10-CM | POA: Insufficient documentation

## 2014-07-16 DIAGNOSIS — I10 Essential (primary) hypertension: Secondary | ICD-10-CM | POA: Insufficient documentation

## 2014-07-16 DIAGNOSIS — R42 Dizziness and giddiness: Secondary | ICD-10-CM | POA: Insufficient documentation

## 2014-07-16 MED ORDER — FUROSEMIDE 20 MG PO TABS: 20.0000 mg | ORAL_TABLET | Freq: Every day | ORAL | Status: DC

## 2014-07-16 MED ORDER — POTASSIUM CHLORIDE CRYS ER 10 MEQ PO TBCR: 10.0000 meq | EXTENDED_RELEASE_TABLET | Freq: Every day | ORAL | Status: DC

## 2014-07-16 NOTE — Patient Instructions (Signed)

## 2014-07-16 NOTE — Progress Notes (Signed)
Subjective:    Patient ID: Steven Brady, male    DOB: 1956/03/20, 59 y.o.   MRN: [id number]  HPI  Admit date 07/10/14 Discharge date 07/12/14  Steven Brady was referred to the emergency room due to accelerated hypertension and dizziness and was found to have a blood pressure of 200/100. He was given lisinopril and had an MRI of his brain ordered which revealed no acute intracranial process and the presence of remote right basal ganglia bilateral thalamic lacunar infarcts. He was diagnosed with vertigo and seen by physical therapy for vestibular training; he was commenced on meclizine and discharged when blood pressures stabilized.  Of note he had a hospitalization for chest pain on 06/08/14 and was found to have a BP of 225/125; he  was seen by cardiology, was ruled out for ACS and 2d echo revealed Ef of 55-60% , LV with no wall motion abnormalities.   Interval history: He reports doing well on antihypertensives but still has some episodes of dizziness despite taking meclizine. He has also noticed significant weight gain; states he has gained 23 pounds in the last 4 months and was discharged on Lasix at his hospitalization prior to this one which she is not sure if he should continue taking. He denies being short of breath but states he feels "heavy".  Past Medical History  Diagnosis Date  . Hypertension   . 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    recently hurt left ankle - 2 weeks ago  . Hematuria - cause not known 06/08/11    pt has been seeing blood off and on in urine  . Hyperlipidemia   . Tobacco abuse   . CHF (congestive heart failure)     Past Surgical History  Procedure Laterality Date  . No past surgeries      History   Social History  . Marital Status: Married    Spouse Name: N/A  . Number of Children: N/A  . Years of Education: N/A   Occupational History  . Not on file.   Social History Main Topics  . Smoking status: Former Smoker -- 0.25 packs/day    Types:  Cigarettes    Quit date: 07/10/2014  . Smokeless tobacco: Never Used  . Alcohol Use: No  . Drug Use: Yes    Special: Marijuana  . Sexual Activity: Not on file   Other Topics Concern  . Not on file   Social History Narrative    Family History  Problem Relation Age of Onset  . Dementia Mother     No Known Allergies  Current Outpatient Prescriptions on File Prior to Visit  Medication Sig Dispense Refill  . amLODipine (NORVASC) 10 MG tablet Take 1 tablet (10 mg total) by mouth daily. 30 tablet 0  . aspirin EC 81 MG tablet Take 81 mg by mouth daily.    Marland Kitchen atorvastatin (LIPITOR) 40 MG tablet Take 40 mg by mouth daily.    . carvedilol (COREG) 12.5 MG tablet Take 1 tablet (12.5 mg total) by mouth 2 (two) times daily. 60 tablet 1  . hydrALAZINE (APRESOLINE) 25 MG tablet Take 1 tablet (25 mg total) by mouth every 8 (eight) hours. 90 tablet 1  . meclizine (ANTIVERT) 25 MG tablet Take 1 tablet (25 mg total) by mouth 2 (two) times daily. 60 tablet 1  . Multiple Vitamins-Minerals (CENTRUM ADULTS PO) Take 1 tablet by mouth daily.    Marland Kitchen UNABLE TO FIND Outpatient vestibular rehabilitation. Outpatient Physical therapy 1 each 0   No  current facility-administered medications on file prior to visit.     Review of Systems  Constitutional: Positive for unexpected weight change. Negative for activity change and appetite change.  HENT: Negative for sinus pressure and sore throat.   Eyes: Negative for visual disturbance.  Respiratory: Negative for chest tightness and shortness of breath.   Cardiovascular: Negative for chest pain and palpitations.  Gastrointestinal: Negative for abdominal pain and abdominal distention.  Endocrine: Negative for cold intolerance, heat intolerance and polyphagia.  Genitourinary: Negative for dysuria, frequency and difficulty urinating.  Musculoskeletal: Negative for back pain, joint swelling and arthralgias.  Skin: Negative for color change.  Neurological: Positive  for light-headedness. Negative for dizziness, tremors and weakness.  Psychiatric/Behavioral: Negative for suicidal ideas and behavioral problems.         Objective: Filed Vitals:   07/16/14 1405  BP: 137/78  Pulse: 76  Temp: 98.2 F (36.8 C)  Resp: 18      Physical Exam  Constitutional: He is oriented to person, place, and time. He appears well-developed and well-nourished.  HENT:  Head: Normocephalic and atraumatic.  Right Ear: External ear normal.  Left Ear: External ear normal.  Eyes: Conjunctivae and EOM are normal. Pupils are equal, round, and reactive to light.  Neck: Normal range of motion. Neck supple. No tracheal deviation present.  Cardiovascular: Normal rate, regular rhythm and normal heart sounds.   No murmur heard. Pulmonary/Chest: Effort normal and breath sounds normal. No respiratory distress. He has no wheezes. He exhibits no tenderness.  Abdominal: Soft. Bowel sounds are normal. He exhibits no mass. There is no tenderness.  Musculoskeletal: Normal range of motion. He exhibits no edema or tenderness.  Neurological: He is alert and oriented to person, place, and time.  Skin: Skin is warm and dry.  Psychiatric: He has a normal mood and affect.      EXAM: MRI HEAD WITHOUT CONTRAST  TECHNIQUE: Multiplanar, multiecho pulse sequences of the brain and surrounding structures were obtained without intravenous contrast.  COMPARISON: CT of the head August 25, 2009  FINDINGS: The ventricles and sulci are normal for patient's age. No abnormal parenchymal signal, mass lesions, mass effect. No reduced diffusion to suggest acute ischemia. No susceptibility artifact to suggest hemorrhage. Confluent supratentorial and pontine T2 hyperintense signal with focal cystic subcentimeter white matter infarcts. RIGHT basal ganglion and bilateral thalamic subcentimeter remote lacunar infarcts.  No abnormal extra-axial fluid collections. No extra-axial masses though,  contrast enhanced sequences would be more sensitive. Normal major intracranial vascular flow voids seen at the skull base.  Ocular globes and orbital contents are unremarkable though not tailored for evaluation. No abnormal sellar expansion. RIGHT maxillary mucosal retention cyst without paranasal sinus air-fluid levels. 12 mm bright T2, low T1 cyst arising from the superior aspect of the RIGHT external auditory canal, recommend direct inspection. No suspicious calvarial bone marrow signal. No abnormal sellar expansion. Craniocervical junction maintained.  IMPRESSION: No acute intracranial process, specifically no acute ischemia.  Moderate to severe white matter changes likely represent chronic small vessel ischemic disease, advanced for age.  Remote RIGHT basal ganglia, bilateral thalamic lacunar infarcts.   Electronically Signed  By: Awilda Metroourtnay Bloomer  On: 07/11/2014 05:29      Assessment & Plan:  59 year old male patient recently hospitalized for hypertensive emergency and vertigo now with controlled blood pressure and reports persisting episodes of vertigo.  Essential hypertension: Controlled on current medications  Vertigo: Uncontrolled on meclizine. Scheduled for vestibular rehabilitation. Have advised him to change positions slowly to prevent  sudden episodes of vertigo.  Weight gain: 2-D echo from 05/2014 reveals an ejection fraction of 55-60% with LVH but no systolic dysfunction. Recent hospitalization and associated IV fluids could have contributed to his recent weight gain; I have advised him to use Lasix only as needed for 3 days and will give him a 30 day supply and also some potassium pills to prevent hypokalemia. Review of his chart indicates Lasix was held due to transient rise in creatinine from 1.2 to 1.5 which decreased to 1.14 at discharge. Spoke to patient on the phone after his visit to reiterate this  A 2 week follow-up is scheduled for him to  establish care with a PCP and evaluate his weight gain and he will also need a BMET at that time.

## 2014-07-16 NOTE — Progress Notes (Signed)
Patient sent to ED from here on 4/19, was hospitalized for 3 days, for high blood pressure.

## 2014-07-30 ENCOUNTER — Ambulatory Visit: Payer: Self-pay | Attending: Family Medicine | Admitting: Family Medicine

## 2014-07-30 ENCOUNTER — Encounter: Payer: Self-pay | Admitting: Family Medicine

## 2014-07-30 VITALS — BP 136/86 | HR 66 | Temp 98.4°F | Resp 20 | Ht 67.0 in | Wt 183.0 lb

## 2014-07-30 DIAGNOSIS — I6381 Other cerebral infarction due to occlusion or stenosis of small artery: Secondary | ICD-10-CM

## 2014-07-30 DIAGNOSIS — I10 Essential (primary) hypertension: Secondary | ICD-10-CM | POA: Insufficient documentation

## 2014-07-30 DIAGNOSIS — H9313 Tinnitus, bilateral: Secondary | ICD-10-CM | POA: Insufficient documentation

## 2014-07-30 DIAGNOSIS — J301 Allergic rhinitis due to pollen: Secondary | ICD-10-CM | POA: Insufficient documentation

## 2014-07-30 DIAGNOSIS — H6121 Impacted cerumen, right ear: Secondary | ICD-10-CM | POA: Insufficient documentation

## 2014-07-30 DIAGNOSIS — R42 Dizziness and giddiness: Secondary | ICD-10-CM | POA: Insufficient documentation

## 2014-07-30 DIAGNOSIS — N179 Acute kidney failure, unspecified: Secondary | ICD-10-CM | POA: Insufficient documentation

## 2014-07-30 DIAGNOSIS — Z114 Encounter for screening for human immunodeficiency virus [HIV]: Secondary | ICD-10-CM | POA: Insufficient documentation

## 2014-07-30 DIAGNOSIS — Z720 Tobacco use: Secondary | ICD-10-CM | POA: Insufficient documentation

## 2014-07-30 DIAGNOSIS — H9319 Tinnitus, unspecified ear: Secondary | ICD-10-CM | POA: Insufficient documentation

## 2014-07-30 DIAGNOSIS — I639 Cerebral infarction, unspecified: Secondary | ICD-10-CM | POA: Insufficient documentation

## 2014-07-30 DIAGNOSIS — J309 Allergic rhinitis, unspecified: Secondary | ICD-10-CM | POA: Insufficient documentation

## 2014-07-30 DIAGNOSIS — E559 Vitamin D deficiency, unspecified: Secondary | ICD-10-CM

## 2014-07-30 MED ORDER — CARBAMIDE PEROXIDE 6.5 % OT SOLN: 5.0000 [drp] | Freq: Two times a day (BID) | OTIC | Status: DC

## 2014-07-30 MED ORDER — CETIRIZINE HCL 10 MG PO TABS
10.0000 mg | ORAL_TABLET | Freq: Every day | ORAL | Status: DC
Start: 2014-07-30 — End: 2022-08-11

## 2014-07-30 MED ORDER — FLUTICASONE PROPIONATE 50 MCG/ACT NA SUSP
2.0000 | Freq: Every day | NASAL | Status: DC
Start: 2014-07-30 — End: 2022-08-11

## 2014-07-30 NOTE — Assessment & Plan Note (Signed)
Screening HIV ordered  

## 2014-07-30 NOTE — Assessment & Plan Note (Signed)
Cold symptoms are allergies: Zyrtec 10 mg daily in AM flonase 2 sprays each nose nightly

## 2014-07-30 NOTE — Progress Notes (Signed)
   Subjective:    Patient ID: Steven Brady, male    DOB: [birth year], 57 y.o.   MRN: [id number] CC: meet PCP, f/u HTN, cold symptoms x 3 days  HPI  1. HTN: taking hydralazine and coreg. NOT taking Norvasc. ROS as below. Working to quit smoking. Recent MRI revealed Remote RIGHT basal ganglia, bilateral thalamic lacunar infarcts. No acute infarcts.   2. Dizziness: now for 3 months or so. Room spinning. No HA. Ringing in ears is chronic patient reports chronic loud noise exposure. No weakness. Patient had an MRI. Dizziness comes and goes.   3. Cold symptoms: congestions and sneezing x 3 days. ROS as per below. Symptoms have not improved with Alkaseltzer plus cold.   Soc Hx: current smoker, working to quit, last cigarette 07/27/14   Review of Systems  Constitutional: Negative.   HENT: Positive for congestion, sinus pressure and tinnitus. Negative for ear discharge, ear pain, facial swelling, hearing loss, nosebleeds, postnasal drip, rhinorrhea, sneezing, sore throat, trouble swallowing and voice change.   Respiratory: Negative.   Cardiovascular: Negative.   Neurological: Positive for dizziness. Negative for tremors, seizures, syncope, facial asymmetry, speech difficulty, weakness, light-headedness, numbness and headaches.       Burning sensation on R side       Objective:   Physical Exam BP 143/84 mmHg  Pulse 66  Temp(Src) 98.4 F (36.9 C) (Oral)  Resp 20  Ht 5\' 7"  (1.702 m)  Wt 183 lb (83.008 kg)  BMI 28.66 kg/m2  SpO2 99%  BP Readings from Last 3 Encounters:  07/30/14 136/86  07/16/14 137/78  07/12/14 171/108  General appearance: alert, cooperative and no distress Head: Normocephalic, without obvious abnormality, atraumatic Eyes: conjunctivae/corneas clear. PERRL, EOM's intact.  Ears: normal TM and external ear canal left ear and abnormal external canal right ear - ceruminosis impacting canal and edematous Nose: no discharge, turbinates pink, swollen Throat: lips, mucosa, and  tongue normal; teeth and gums normal Neck: no adenopathy, supple, symmetrical, trachea midline and thyroid not enlarged, symmetric, no tenderness/mass/nodules  Heart: regular rate and rhythm, S1, S2 normal, no murmur, click, rub or gallop Lungs: clear to auscultation bilaterally     Assessment & Plan:

## 2014-07-30 NOTE — Assessment & Plan Note (Signed)
Vertigo: room spinning type dizziness This could due to remote infarcts (areas of lack of blood flow in your brain) noted on MRI done last month: Remote RIGHT basal ganglia, bilateral thalamic lacunar infarcts. R sided infarcts often produce L sided symptoms.  checking vit D, cleaning ears, plan for audiology evaluation once ears cleaned.

## 2014-07-30 NOTE — Assessment & Plan Note (Signed)
Smoking cessation support: smoking cessation hotline: 1-800-QUIT-NOW.  Smoking cessation classes are available through [facility]. Call 336-832-9999 or visit our website at [email or web address].   

## 2014-07-30 NOTE — Assessment & Plan Note (Signed)
Resolved

## 2014-07-30 NOTE — Assessment & Plan Note (Signed)
HTN:  Goal BP is , 140/90 at all times Continue hydralazine 25 mg three times a day Continue coreg 12.5 mg twice daily

## 2014-07-30 NOTE — Patient Instructions (Signed)
[name],  Thank you for coming in today.  1. HTN:  Goal BP is , 140/90 at all times Continue hydralazine 25 mg three times a day Continue coreg 12.5 mg twice daily    2. Smoking cessation support: smoking cessation hotline: 1-800-QUIT-NOW.  Smoking cessation classes are available through [facility]. Call [phone] or visit our website at [facility].  3. Cold symptoms are allergies: Zyrtec 10 mg daily in AM flonase 2 sprays each nose nightly  4. R ear wax: Debrox in R ear   5. Vertigo: room spinning type dizziness This could due to remote infarcts (areas of lack of blood flow in your brain) noted on MRI done last month: Remote RIGHT basal ganglia, bilateral thalamic lacunar infarcts. R sided infarcts often produce L sided symptoms.  checking vit D, cleaning ears, plan for audiology evaluation once ears cleaned.   F/u with RN for ear cleaning and BP check in 2 weeks F/u with me in 6 weeks  Dr. Armen PickupFunches

## 2014-07-30 NOTE — Assessment & Plan Note (Signed)
R ear wax: Debrox in R ear

## 2014-07-30 NOTE — Progress Notes (Signed)
Establish Care F/U HTN Complaining of Cold Sx x3 days

## 2014-07-31 DIAGNOSIS — E559 Vitamin D deficiency, unspecified: Secondary | ICD-10-CM | POA: Insufficient documentation

## 2014-07-31 LAB — HIV ANTIBODY (ROUTINE TESTING W REFLEX): HIV 1&2 Ab, 4th Generation: NONREACTIVE

## 2014-07-31 LAB — VITAMIN D 25 HYDROXY (VIT D DEFICIENCY, FRACTURES): VIT D 25 HYDROXY: 24 ng/mL — AB (ref 30–100)

## 2014-07-31 MED ORDER — VITAMIN D3 50 MCG (2000 UT) PO TABS: 2000.0000 [IU] | ORAL_TABLET | Freq: Every day | ORAL | Status: AC

## 2014-07-31 NOTE — Assessment & Plan Note (Signed)
A: vit D insuff P: 2000 IU vit D daily

## 2014-07-31 NOTE — Addendum Note (Signed)
Addended by: Dessa PhiFUNCHES, Robyn Galati on: 07/31/2014 03:57 PM   Modules accepted: Orders

## 2014-08-01 ENCOUNTER — Telehealth: Payer: Self-pay | Admitting: *Deleted

## 2014-08-01 NOTE — Telephone Encounter (Signed)
-----   Message from Dessa PhiJosalyn Funches, MD sent at 07/31/2014  3:57 PM EDT ----- Screening HIV negative. Vit D insuff  2000 IU daily recommended

## 2014-08-01 NOTE — Telephone Encounter (Signed)
Wrong number per male

## 2014-08-07 ENCOUNTER — Telehealth: Payer: Self-pay | Admitting: Clinical

## 2014-08-07 NOTE — Telephone Encounter (Signed)
See above

## 2016-01-01 IMAGING — MR MR HEAD W/O CM
11 of 13 series · 33 of 48 positions shown · non-contrast
Comparison: CT of the head August 25, 2009

CLINICAL DATA: Dizziness. Hypertension, CHF, acute kidney injury,
hyperlipidemia.

EXAM:
MRI HEAD WITHOUT CONTRAST
TECHNIQUE: Multiplanar, multiecho pulse sequences of the brain and surrounding
structures were obtained without intravenous contrast.

[Series 2: FLAIR · sagittal · 5.0mm · 0.47mm/px · 2 of 23 slices shown (1 of 3)]
[im 1/23]
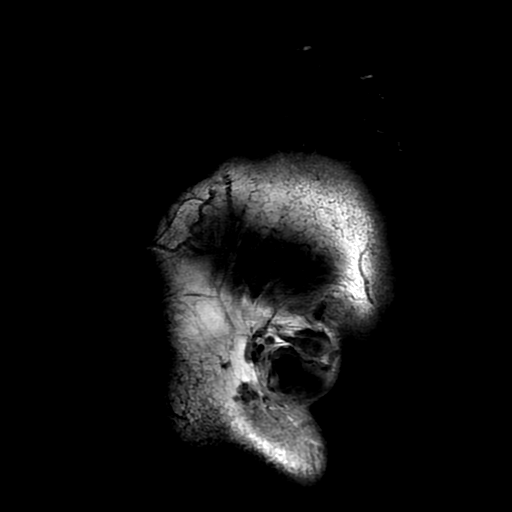
[im 23/23]
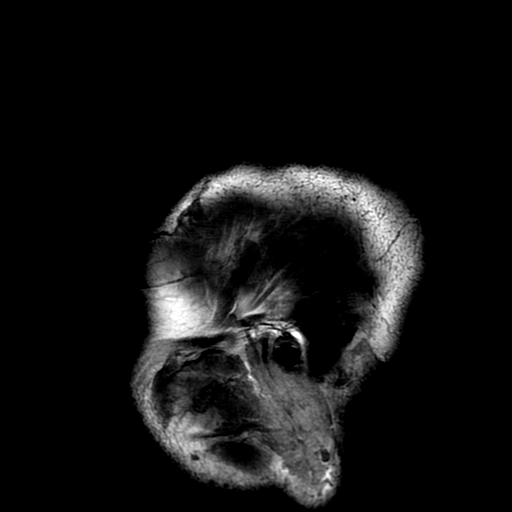

[Series 4: DWI · axial · 3.0mm · 0.94mm/px · z∈[-94,+45]mm · 8 of 96 slices shown (1 of 4)]
[im 1/96]
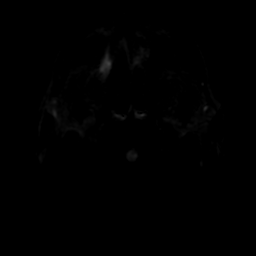
[im 14/96]
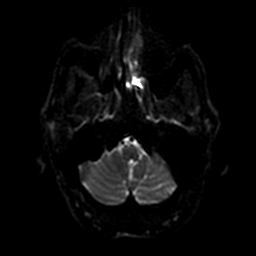
[im 28/96]
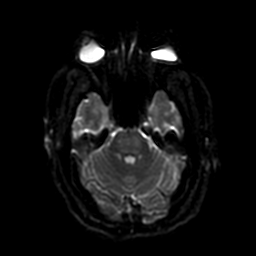
[im 41/96]
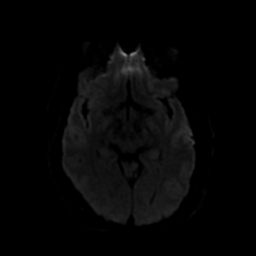
[im 55/96]
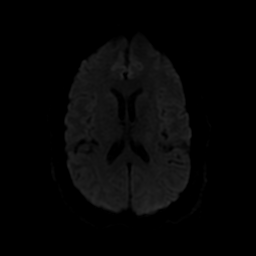
[im 68/96]
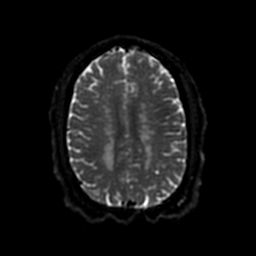
[im 82/96]
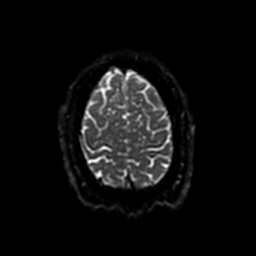
[im 96/96]
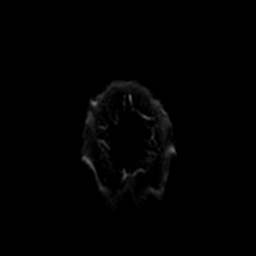

[Series 5: T2 · axial · 5.0mm · 0.47mm/px · z∈[-92,+44]mm · 2 of 24 slices shown (1 of 2)]
[im 1/24]
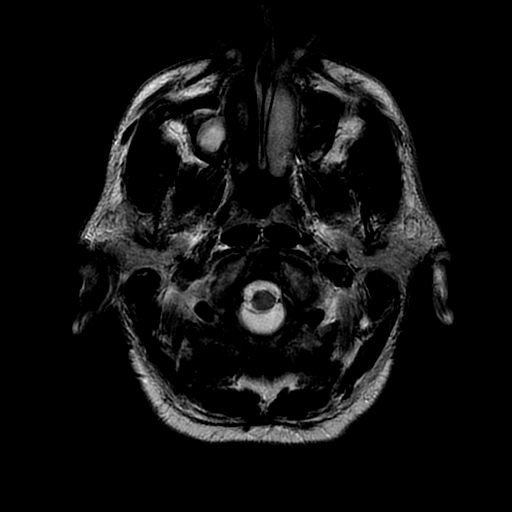
[im 24/24]
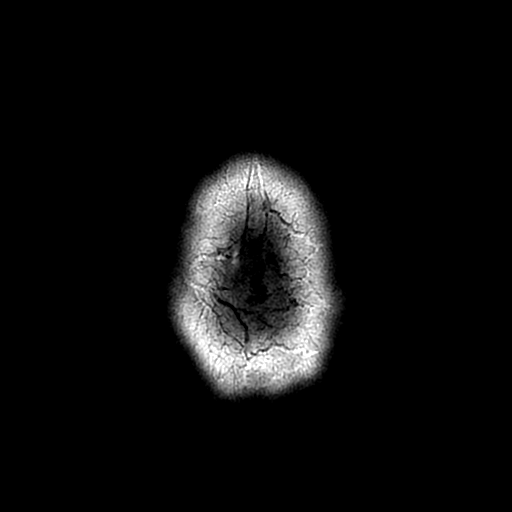

[Series 6: FLAIR · axial · 5.0mm · 0.47mm/px · z∈[-92,+44]mm · 2 of 24 slices shown (2 of 3)]
[im 1/24]
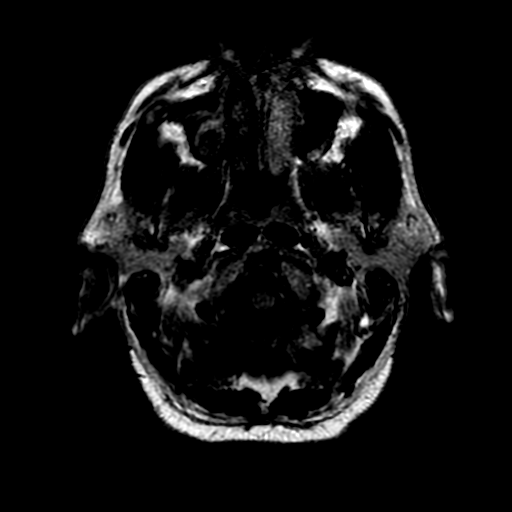
[im 24/24]
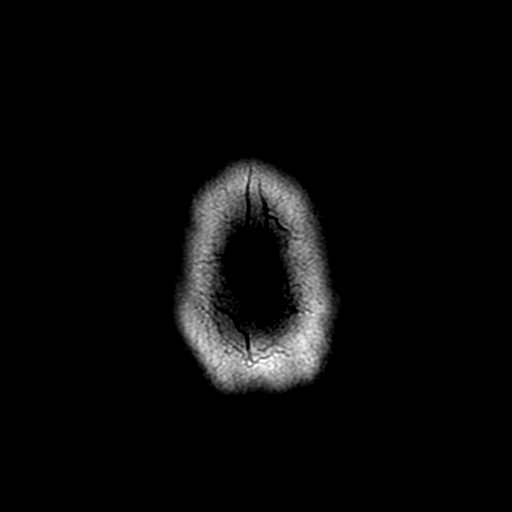

[Series 7: DWI · coronal · 5.0mm · 0.94mm/px · 5 of 64 slices shown (2 of 4)]
[im 1/64]
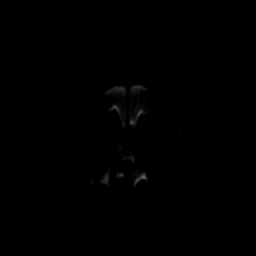
[im 16/64]
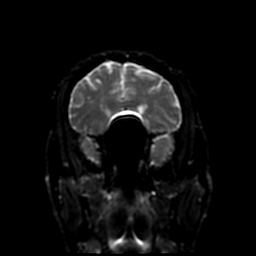
[im 32/64]
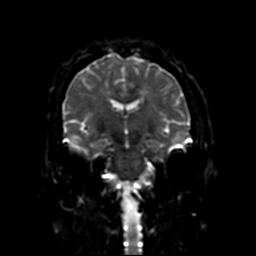
[im 48/64]
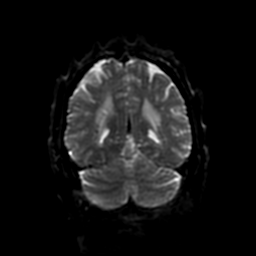
[im 64/64]
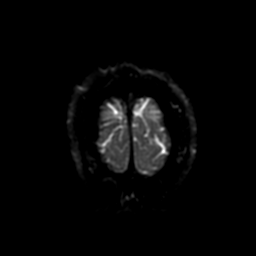

[Series 8: (person_name) · axial · 3.0mm · 0.47mm/px · 1 of 96 slices shown]
[im 1/96]
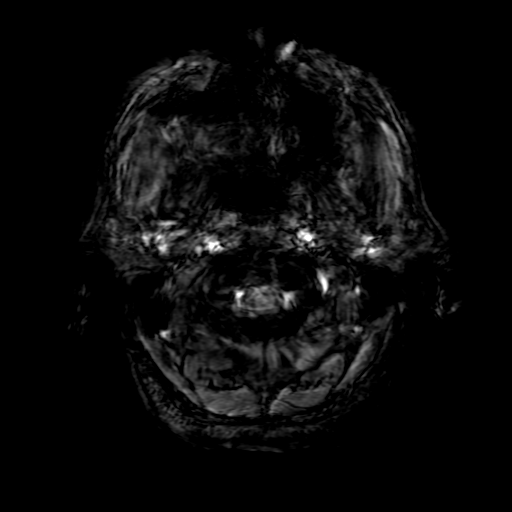

[Series 10: T2 · coronal · 5.0mm · 0.39mm/px · 2 of 27 slices shown (2 of 2)]
[im 1/27]
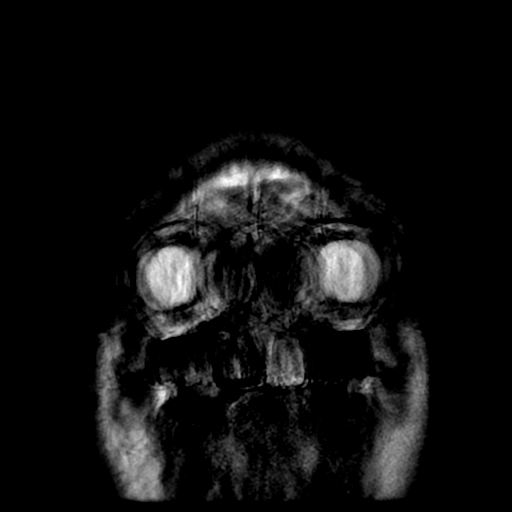
[im 27/27]
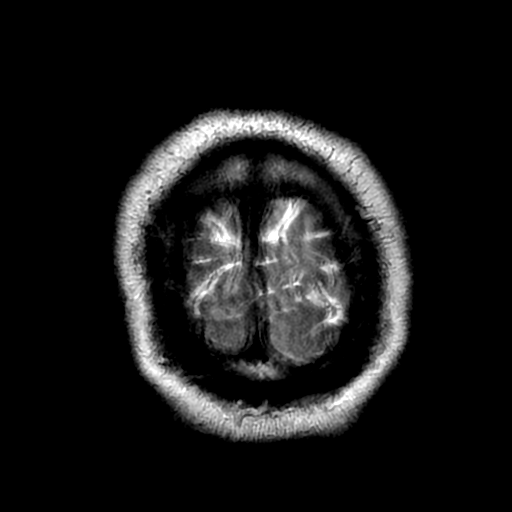

[Series 11: FLAIR · axial · 5.0mm · 0.47mm/px · z∈[-92,+44]mm · 2 of 24 slices shown (3 of 3)]
[im 1/24]
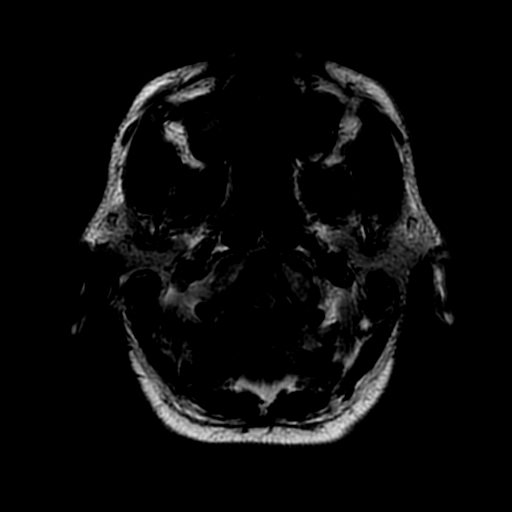
[im 24/24]
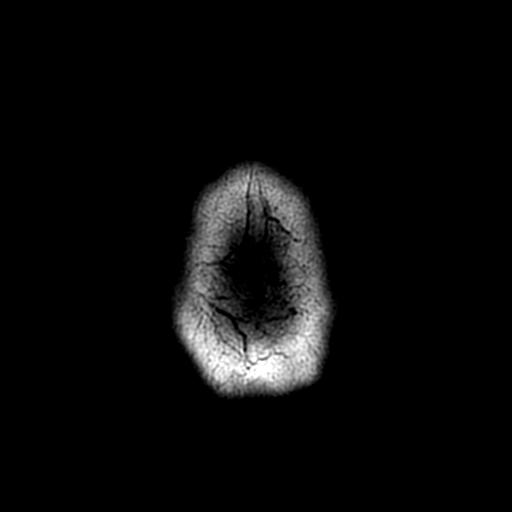

[Series 12: GRE · axial · 5.0mm · 0.47mm/px · z∈[-92,+44]mm · 2 of 24 slices shown]
[im 1/24]
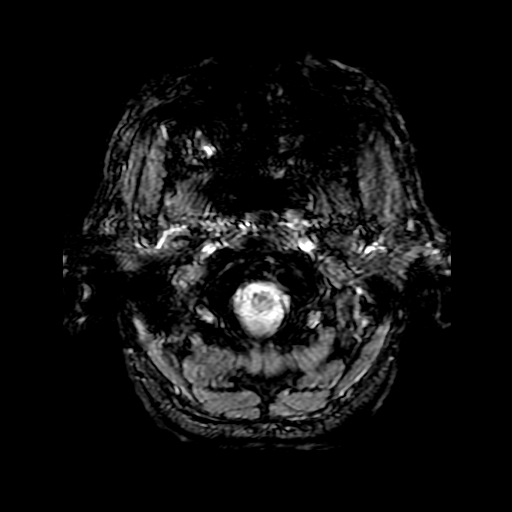
[im 24/24]
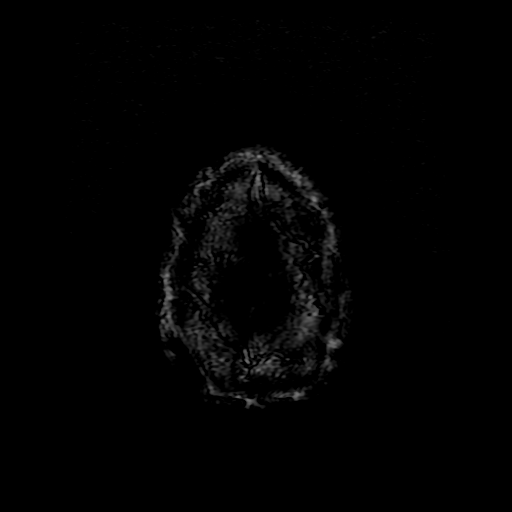

[Series 400: DWI · axial · 3.0mm · 0.94mm/px · z∈[-94,+45]mm · 4 of 48 slices shown (3 of 4)]
[im 1/48]
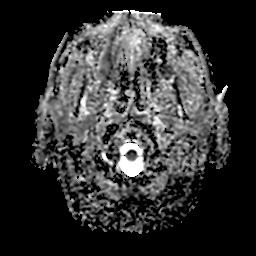
[im 16/48]
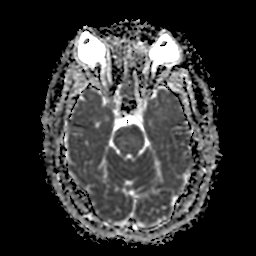
[im 32/48]
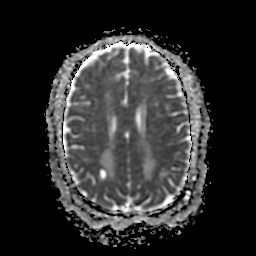
[im 48/48]
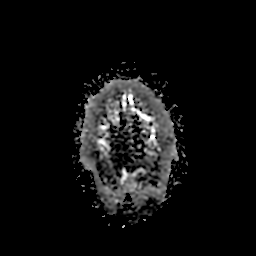

[Series 700: DWI · coronal · 5.0mm · 0.94mm/px · 3 of 32 slices shown (4 of 4)]
[im 1/32]
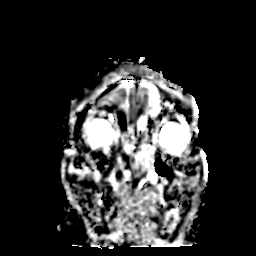
[im 16/32]
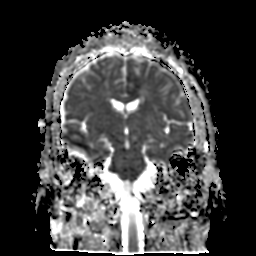
[im 32/32]
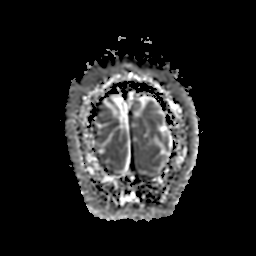

[33 of 48 positions shown; findings below may reference images not displayed]

FINDINGS: The ventricles and sulci are normal for patient's age. No abnormal
parenchymal signal, mass lesions, mass effect. No reduced diffusion
to suggest acute ischemia. No susceptibility artifact to suggest
hemorrhage. Confluent supratentorial and pontine T2 hyperintense
signal with focal cystic subcentimeter white matter infarcts. RIGHT
basal ganglion and bilateral thalamic subcentimeter remote lacunar
infarcts.

No abnormal extra-axial fluid collections. No extra-axial masses
though, contrast enhanced sequences would be more sensitive. Normal
major intracranial vascular flow voids seen at the skull base.

Ocular globes and orbital contents are unremarkable though not
tailored for evaluation. No abnormal sellar expansion. RIGHT
maxillary mucosal retention cyst without paranasal sinus air-fluid
levels. 12 mm bright T2, low T1 cyst arising from the superior
aspect of the RIGHT external auditory canal, recommend direct
inspection. No suspicious calvarial bone marrow signal. No abnormal
sellar expansion. Craniocervical junction maintained.
IMPRESSION: No acute intracranial process, specifically no acute ischemia.

Moderate to severe white matter changes likely represent chronic
small vessel ischemic disease, advanced for age.

Remote RIGHT basal ganglia, bilateral thalamic lacunar infarcts.

By: Abimelk Tiger

## 2021-10-21 ENCOUNTER — Emergency Department (HOSPITAL_COMMUNITY)
Admission: EM | Admit: 2021-10-21 | Discharge: 2021-10-21 | Disposition: A | Discharge status: Home / Self Care | Payer: Medicaid Other | Attending: Emergency Medicine | Admitting: Emergency Medicine

## 2021-10-21 ENCOUNTER — Other Ambulatory Visit: Payer: Self-pay

## 2021-10-21 ENCOUNTER — Encounter (HOSPITAL_COMMUNITY): Payer: Self-pay | Admitting: Emergency Medicine

## 2021-10-21 DIAGNOSIS — M79605 Pain in left leg: Secondary | ICD-10-CM | POA: Insufficient documentation

## 2021-10-21 DIAGNOSIS — I110 Hypertensive heart disease with heart failure: Secondary | ICD-10-CM | POA: Diagnosis not present

## 2021-10-21 DIAGNOSIS — I509 Heart failure, unspecified: Secondary | ICD-10-CM | POA: Insufficient documentation

## 2021-10-21 DIAGNOSIS — Z7982 Long term (current) use of aspirin: Secondary | ICD-10-CM | POA: Diagnosis not present

## 2021-10-21 DIAGNOSIS — M5432 Sciatica, left side: Secondary | ICD-10-CM

## 2021-10-21 MED ORDER — CYCLOBENZAPRINE HCL 10 MG PO TABS: 5.0000 mg | ORAL_TABLET | Freq: Two times a day (BID) | ORAL | 0 refills | Status: DC | PRN

## 2021-10-21 MED ORDER — TRAMADOL HCL 50 MG PO TABS: 50.0000 mg | ORAL_TABLET | Freq: Four times a day (QID) | ORAL | 0 refills | Status: DC | PRN

## 2021-10-21 MED ORDER — OXYCODONE-ACETAMINOPHEN 5-325 MG PO TABS
1.0000 | ORAL_TABLET | Freq: Once | ORAL | Status: AC
  Administered 2021-10-21: 1 via ORAL
  Filled 2021-10-21: qty 1

## 2021-10-21 MED ORDER — KETOROLAC TROMETHAMINE 60 MG/2ML IM SOLN
60.0000 mg | Freq: Once | INTRAMUSCULAR | Status: AC
  Administered 2021-10-21: 60 mg via INTRAMUSCULAR
  Filled 2021-10-21: qty 2

## 2021-10-21 NOTE — Discharge Instructions (Addendum)
Make an appointment to be seen by [facility] for follow up of sciatic leg pain Get help right away if: You cannot control when you pee (urinate) or poop (have a bowel movement). You have weakness in any of these areas and it gets worse: Lower back. The area between your hip bones. Butt. Legs. You have redness or swelling of your back. You have a burning feeling when you pee.

## 2021-10-21 NOTE — ED Provider Notes (Signed)
[facility] EMERGENCY DEPARTMENT Provider Note   CSN: [id number] Arrival date & time: 10/21/21  7322     History  Chief Complaint  Patient presents with   Leg Pain    Steven Brady is a 66 y.o. male.  Patient is a 66 year old male with a history of stroke, CHF, hypertension, hyperlipidemia who presents to the ED for evaluation of left leg pain.  Patient states the pain started 7 days ago and has increased in severity until today. Rates pain at a 43/10.  States the pain starts in his left lumbar area and radiates down to just above the left knee posteriorly.  Describes it as constant.  Describes as a sharp pain followed by burning sensation.  Pain is worsened with leg movement and standing straight up.  Patient denies loss of bowel or bladder, saddle paresthesia, fevers or chills at home.  Patient states he went to the [facility] on 10/16/2021 and states they gave him an injection of a steroid and medication to pick up from the pharmacy.  He states he took the full course of the steroid prescription without relief.  Patient states he has not smoked since October 2022.  Denies injection drug use.   Leg Pain      Home Medications Prior to Admission medications   Medication Sig Start Date End Date Taking? Authorizing Provider  aspirin EC 81 MG tablet Take 81 mg by mouth daily.    [provider]  atorvastatin (LIPITOR) 40 MG tablet Take 40 mg by mouth daily. 03/04/14   [provider]  carbamide peroxide (DEBROX) 6.5 % otic solution Place 5 drops into the right ear 2 (two) times daily. For 5 days 07/30/14   Dessa Phi, MD  carvedilol (COREG) 12.5 MG tablet Take 1 tablet (12.5 mg total) by mouth 2 (two) times daily. 07/12/14   Vassie Loll, MD  cetirizine (ZYRTEC) 10 MG tablet Take 1 tablet (10 mg total) by mouth daily. 07/30/14   Funches, Gerilyn Nestle, MD  Cholecalciferol (VITAMIN D3) 2000 UNITS TABS Take 2,000 Units by mouth daily. 07/31/14    Funches, Gerilyn Nestle, MD  fluticasone (FLONASE) 50 MCG/ACT nasal spray Place 2 sprays into both nostrils daily. 07/30/14   Funches, Gerilyn Nestle, MD  hydrALAZINE (APRESOLINE) 25 MG tablet Take 1 tablet (25 mg total) by mouth every 8 (eight) hours. 07/12/14   Vassie Loll, MD  meclizine (ANTIVERT) 25 MG tablet Take 1 tablet (25 mg total) by mouth 2 (two) times daily. 07/12/14   Vassie Loll, MD  Multiple Vitamins-Minerals (CENTRUM ADULTS PO) Take 1 tablet by mouth daily.    [provider]  UNABLE TO FIND Outpatient vestibular rehabilitation. Outpatient Physical therapy 07/12/14   Vassie Loll, MD      Allergies    Patient has no known allergies.    Review of Systems   Review of Systems  Physical Exam Updated Vital Signs BP (!) 146/111 (BP Location: Right Arm)   Pulse (!) 56   Temp 97.7 F (36.5 C) (Oral)   Resp 17   SpO2 97%  Physical Exam Vitals and nursing note reviewed.  Constitutional:      General: He is not in acute distress.    Appearance: Normal appearance. He is well-developed.  HENT:     Head: Normocephalic and atraumatic.  Eyes:     Extraocular Movements: Extraocular movements intact.     Conjunctiva/sclera: Conjunctivae normal.  Cardiovascular:     Rate and Rhythm: Normal rate and regular  rhythm.     Pulses: Normal pulses.     Heart sounds: No murmur heard. Pulmonary:     Effort: Pulmonary effort is normal. No respiratory distress.     Breath sounds: Normal breath sounds.  Abdominal:     Palpations: Abdomen is soft.     Tenderness: There is no abdominal tenderness.  Musculoskeletal:        General: Tenderness present. No swelling.     Cervical back: Neck supple.     Comments: Mild tenderness to palpation along the left hip, left posterior leg.  No midline spine tenderness.  Skin:    General: Skin is warm and dry.     Capillary Refill: Capillary refill takes less than 2 seconds.  Neurological:     General: No focal deficit present.     Mental Status: He  is alert and oriented to person, place, and time.     Sensory: No sensory deficit.     Motor: No weakness.     Coordination: Coordination normal.     Gait: Gait normal.     Comments: Straight leg test positive on the left, straight leg test negative on right  Psychiatric:        Mood and Affect: Mood normal.     ED Results / Procedures / Treatments   Labs (all labs ordered are listed, but only abnormal results are displayed) Labs Reviewed - No data to display  EKG None  Radiology No results found.  Procedures Procedures    Medications Ordered in ED Medications  oxyCODONE-acetaminophen (PERCOCET/ROXICET) 5-325 MG per tablet 1 tablet (1 tablet Oral Given 10/21/21 0346)    ED Course/ Medical Decision Making/ A&P Clinical Course as of 10/21/21 1410  Tue Oct 21, 2021  1230 L lumbar, lateral hip, posterior leg pain radiating down to knee level. Describes pain as sharp and burning, worsened with lying on that side or extending the hip [AS]  1230 Reports percocet provided no relief [AS]  1320 Ketorolac provided minimal pain relief [AS]    Clinical Course User Index [AS] Lula Olszewski Edsel Petrin, PA-C                           Medical Decision Making  Patient is a 66 year old male presenting for evaluation of leg pain.  He reports pain started approximately 7 days ago.  He was seen in urgent care on 10/16/2021 and given one-time dose of Solu-Medrol and a prednisone Dosepak.  He reports finishing the Dosepak and had no relief.  He states he has been taking 1000 to 2000 mg of Tylenol every 6 hours for pain with no relief.  Discussed with patient appropriate dosing.  Patient has a history of stroke and is anticoagulated with Eliquis, making daily NSAID therapy inappropriate.  CT lumbar spine and MRI lumbar spine were considered, but not appropriate due to lack of red flag symptoms.  Differential diagnosis includes sciatica, cauda equina, transverse myelitis, muscle strain, cord compression,  epidural abscess  With absence of red flag symptoms, sciatica seems to be the most likely diagnosis.  Patient will be given one-time dose of Toradol while in the ER and sent home with a prescription for Flexeril and Tramadol.  [facility]  He reports that he will make an appointment as soon as he leaves the hospital to see his [facility] PCP  IM Toradol was minimally effective in reducing pain.  Final Clinical Impression(s) / ED Diagnoses Final diagnoses:  None    Rx / DC Orders ED Discharge Orders     None         Michelle Piper, Cordelia Poche 10/21/21 1416    Terald Sleeper, MD 10/21/21 [phone]

## 2021-10-21 NOTE — ED Provider Triage Note (Signed)
  Emergency Medicine Provider Triage Evaluation Note  MRN:  [id number]  Arrival date & time: 10/21/21    Medically screening exam initiated at 3:39 AM.   CC:   Back pain  HPI:  Steven Brady is a 66 y.o. year-old male presents to the ED with chief complaint of back pain and sciatica x 1 week.  Was seen at an outside hospital and given "a shot and some tablets to take for 5 days."   Denies any improvement.  Worse with movement.  History provided by patient. ROS:  -As included in HPI PE:  There were no vitals filed for this visit.  Non-toxic appearing No respiratory distress  MDM:  Based on signs and symptoms, lumbar radiculopathy is highest on my differential. I've ordered percocet in triage to help with pain.  Patient was informed that the remainder of the evaluation will be completed by another provider, this initial triage assessment does not replace that evaluation, and the importance of remaining in the ED until their evaluation is complete.    Roxy Horseman, PA-C 10/21/21 [phone]

## 2021-10-21 NOTE — ED Triage Notes (Signed)
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

## 2022-08-04 ENCOUNTER — Telehealth (HOSPITAL_COMMUNITY): Payer: Self-pay

## 2022-08-04 NOTE — Telephone Encounter (Addendum)
Reviewed with patient the Cardiac Rehab Cardiac Risk Prolife Nursing Assessment. Patient knows where our office is located. 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 Recommended he bring his cane with him.

## 2022-08-11 ENCOUNTER — Encounter (HOSPITAL_COMMUNITY)
Admission: RE | Admit: 2022-08-11 | Discharge: 2022-08-11 | Disposition: A | Discharge status: Home / Self Care | Payer: Medicare Other | Source: Ambulatory Visit | Attending: Cardiology | Admitting: Cardiology

## 2022-08-11 VITALS — BP 100/42 | HR 79 | Ht 68.5 in | Wt 209.5 lb

## 2022-08-11 DIAGNOSIS — Z955 Presence of coronary angioplasty implant and graft: Secondary | ICD-10-CM | POA: Diagnosis present

## 2022-08-11 NOTE — Progress Notes (Signed)
Cardiac Individual Treatment Plan  Patient Details  Name: Keath Hegarty MRN: [id number] Date of Birth: 04-18-1955 Referring Provider:   Flowsheet Row INTENSIVE CARDIAC REHAB ORIENT from 08/11/2022 in [facility]  Referring Provider Dr. Cherly Anderson (Armanda Magic, MD covering)       Initial Encounter Date:  Flowsheet Row INTENSIVE CARDIAC REHAB ORIENT from 08/11/2022 in [facility]  Date 08/11/22       Visit Diagnosis: 07/13/22 S/P coronary artery stent placement RCA x 3  Patient's Home Medications on Admission:  Current Outpatient Medications:    apixaban (ELIQUIS) 5 MG TABS tablet, Take 5 mg by mouth 2 (two) times daily., Disp: , Rfl:    atorvastatin (LIPITOR) 40 MG tablet, Take 40 mg by mouth daily., Disp: , Rfl:    carvedilol (COREG) 12.5 MG tablet, Take 1 tablet (12.5 mg total) by mouth 2 (two) times daily., Disp: 60 tablet, Rfl: 1   Cholecalciferol (VITAMIN D3) 2000 UNITS TABS, Take 2,000 Units by mouth daily., Disp: 30 tablet, Rfl: 11   clopidogrel (PLAVIX) 75 MG tablet, Take 75 mg by mouth daily., Disp: , Rfl:    dapagliflozin propanediol (FARXIGA) 10 MG TABS tablet, Take 10 mg by mouth daily., Disp: , Rfl:    furosemide (LASIX) 40 MG tablet, Take 40 mg by mouth daily as needed for fluid or edema., Disp: , Rfl:    hydrALAZINE (APRESOLINE) 25 MG tablet, Take 1 tablet (25 mg total) by mouth every 8 (eight) hours. (Patient taking differently: Take 50 mg by mouth every 8 (eight) hours.), Disp: 90 tablet, Rfl: 1   meclizine (ANTIVERT) 25 MG tablet, Take 1 tablet (25 mg total) by mouth 2 (two) times daily., Disp: 60 tablet, Rfl: 1   Multiple Vitamins-Minerals (CENTRUM ADULTS PO), Take 1 tablet by mouth daily., Disp: , Rfl:    potassium chloride SA (KLOR-CON M) 20 MEQ tablet, Take 20 mEq by mouth daily. Takes 2 tablets for a total of 40 meq, Disp: , Rfl:     sacubitril-valsartan (ENTRESTO) 97-103 MG, Take 1 tablet by mouth 2 (two) times daily., Disp: , Rfl:    spironolactone (ALDACTONE) 25 MG tablet, Take 25 mg by mouth daily., Disp: , Rfl:    UNABLE TO FIND, Outpatient vestibular rehabilitation. 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   recently hurt left ankle - 2 weeks ago   CHF (congestive heart failure) (HCC) 2015   Hematuria - cause not known 06/08/11   pt has been seeing blood off and on in urine   Hyperlipidemia Dx 2015   Hypertension Dx 2015   Tobacco abuse    since age of 5    Tobacco Use: Social History   Tobacco Use  Smoking Status Light Smoker   Packs/day: .25   Types: Cigarettes  Smokeless Tobacco Never    Labs: Review Flowsheet       Latest Ref Rng & Units 08/25/2009 08/26/2009 06/09/2011  Labs for ITP Cardiac and Pulmonary Rehab  Cholestrol 0 - 200 mg/dL - 010        ATP III CLASSIFICATION:  <200     mg/dL   Desirable  272-536  mg/dL   Borderline High  >=644    mg/dL   High         034   LDL (calc) 0 -  99 mg/dL - 161        Total Cholesterol/HDL:CHD Risk Coronary Heart Disease Risk Table                     Men   Women  1/2 Average Risk   3.4   3.3  Average Risk       5.0   4.4  2 X Average Risk   9.6   7.1  3 X Average Risk  23.4   11.0        Use the calculated Patient Ratio above and the CHD Risk Table to determine the patient's CHD Risk.        ATP III CLASSIFICATION (LDL):  <100     mg/dL   Optimal  096-045  mg/dL   Near or Above                    Optimal  130-159  mg/dL   Borderline  409-811  mg/dL   High  >914     mg/dL   Very High  782   HDL-C >39 mg/dL - 46  43   Trlycerides <150 mg/dL - 68  48   Hemoglobin N5A <5.7 % 6.0 (NOTE)                                                                       According to the ADA Clinical Practice Recommendations for 2011, when HbA1c is used as a  screening test:   >=6.5%   Diagnostic of Diabetes Mellitus           (if abnormal result  is confirmed)  5.7-6.4%   Increased risk of developing Diabetes Mellitus  References:Diagnosis and Classification of Diabetes Mellitus,Diabetes Care,2011,34(Suppl 1):S62-S69 and Standards of Medical Care in         Diabetes - 2011,Diabetes OZHY,8657,84  (Suppl 1):S11-S61.  - -    Capillary Blood Glucose: Lab Results  Component Value Date   GLUCAP 177 (H) 08/27/2009   GLUCAP 114 (H) 08/27/2009     Exercise Target Goals: Exercise Program Goal: Individual exercise prescription set using results from initial 6 min walk test and THRR while considering  patient's activity barriers and safety.   Exercise Prescription Goal: Initial exercise prescription builds to 30-45 minutes a day of aerobic activity, 2-3 days per week.  Home exercise guidelines will be given to patient during program as part of exercise prescription that the participant will acknowledge.  Activity Barriers & Risk Stratification:  Activity Barriers & Cardiac Risk Stratification - 08/11/22 1112       Activity Barriers & Cardiac Risk Stratification   Activity Barriers Arthritis;Neck/Spine Problems;Back Problems;Joint Problems;Balance Concerns;Deconditioning;Assistive Device;Other (comment)    Comments S/P CVA with rt side residual deficits, Pt with h/o vertigo    Cardiac Risk Stratification High             6 Minute Walk:  6 Minute Walk     Row Name 08/11/22 1110         6 Minute Walk   Phase Initial  Pt used wheeled walker instead of his cane     Distance 944 feet  Pt used wheeled walker     Walk Time 6 minutes     #  of Rest Breaks 0     MPH 1.8     METS 2.6     RPE 7     Perceived Dyspnea  0     VO2 Peak 8.95     Symptoms No     Resting HR 78 bpm     Resting BP 100/72     Resting Oxygen Saturation  98 %     Exercise Oxygen Saturation  during 6 min walk 97 %     Max Ex. HR 114 bpm     Max Ex. BP 140/82     2  Minute Post BP 124/70              Oxygen Initial Assessment:   Oxygen Re-Evaluation:   Oxygen Discharge (Final Oxygen Re-Evaluation):   Initial Exercise Prescription:  Initial Exercise Prescription - 08/11/22 1300       Date of Initial Exercise RX and Referring Provider   Date 08/11/22    Referring Provider Dr. Cherly Anderson Armanda Magic, MD covering)    Expected Discharge Date 10/23/22      NuStep   Level 1    SPM 80    Minutes 15    METs 2.6      Arm Ergometer   Level 1    Watts 25    RPM 60    Minutes 15    METs 2.6      Prescription Details   Frequency (times per week) 3    Duration Progress to 30 minutes of continuous aerobic without signs/symptoms of physical distress      Intensity   THRR 40-80% of Max Heartrate 62-124    Ratings of Perceived Exertion 11-13    Perceived Dyspnea 0-4      Progression   Progression Continue progressive overload as per policy without signs/symptoms or physical distress.      Resistance Training   Training Prescription Yes    Weight 3 lbs    Reps 10-15             Perform Capillary Blood Glucose checks as needed.  Exercise Prescription Changes:   Exercise Comments:   Exercise Goals and Review:   Exercise Goals     Row Name 08/11/22 1113             Exercise Goals   Increase Physical Activity Yes       Intervention Provide advice, education, support and counseling about physical activity/exercise needs.;Develop an individualized exercise prescription for aerobic and resistive training based on initial evaluation findings, risk stratification, comorbidities and participant's personal goals.       Expected Outcomes Short Term: Attend rehab on a regular basis to increase amount of physical activity.;Long Term: Add in home exercise to make exercise part of routine and to increase amount of physical activity.;Long Term: Exercising regularly at least 3-5 days a week.       Increase Strength and Stamina Yes        Intervention Provide advice, education, support and counseling about physical activity/exercise needs.;Develop an individualized exercise prescription for aerobic and resistive training based on initial evaluation findings, risk stratification, comorbidities and participant's personal goals.       Expected Outcomes Short Term: Increase workloads from initial exercise prescription for resistance, speed, and METs.;Short Term: Perform resistance training exercises routinely during rehab and add in resistance training at home;Long Term: Improve cardiorespiratory fitness, muscular endurance and strength as measured by increased METs and functional capacity ( )  Able to understand and use rate of perceived exertion (RPE) scale Yes       Intervention Provide education and explanation on how to use RPE scale       Expected Outcomes Short Term: Able to use RPE daily in rehab to express subjective intensity level;Long Term:  Able to use RPE to guide intensity level when exercising independently       Knowledge and understanding of Target Heart Rate Range (THRR) Yes       Intervention Provide education and explanation of THRR including how the numbers were predicted and where they are located for reference       Expected Outcomes Short Term: Able to state/look up THRR;Long Term: Able to use THRR to govern intensity when exercising independently;Short Term: Able to use daily as guideline for intensity in rehab       Understanding of Exercise Prescription Yes       Intervention Provide education, explanation, and written materials on patient's individual exercise prescription       Expected Outcomes Short Term: Able to explain program exercise prescription;Long Term: Able to explain home exercise prescription to exercise independently                Exercise Goals Re-Evaluation :   Discharge Exercise Prescription (Final Exercise Prescription Changes):   Nutrition:  Target Goals:  Understanding of nutrition guidelines, daily intake of sodium 1500mg , cholesterol 200mg , calories 30% from fat and 7% or less from saturated fats, daily to have 5 or more servings of fruits and vegetables.  Biometrics:  Pre Biometrics - 08/11/22 0950       Pre Biometrics   Waist Circumference 44 inches    Hip Circumference 46 inches    Waist to Hip Ratio 0.96 %    Triceps Skinfold 10 mm    % Body Fat 29 %    Grip Strength 36 kg    Flexibility --   Not performed due to back problems   Single Leg Stand 2.35 seconds   High risk for fall             Nutrition Therapy Plan and Nutrition Goals:   Nutrition Assessments:  MEDIFICTS Score Key: ?70 Need to make dietary changes  40-70 Heart Healthy Diet ? 40 Therapeutic Level Cholesterol Diet    Picture Your Plate Scores: <82 Unhealthy dietary pattern with much room for improvement. 41-50 Dietary pattern unlikely to meet recommendations for good health and room for improvement. 51-60 More healthful dietary pattern, with some room for improvement.  >60 Healthy dietary pattern, although there may be some specific behaviors that could be improved.    Nutrition Goals Re-Evaluation:   Nutrition Goals Re-Evaluation:   Nutrition Goals Discharge (Final Nutrition Goals Re-Evaluation):   Psychosocial: Target Goals: Acknowledge presence or absence of significant depression and/or stress, maximize coping skills, provide positive support system. Participant is able to verbalize types and ability to use techniques and skills needed for reducing stress and depression.  Initial Review & Psychosocial Screening:  Initial Psych Review & Screening - 08/11/22 1114       Initial Review   Current issues with Current Depression;Current Sleep Concerns   Pt declines counseling at this time     Family Dynamics   Good Support System? Yes   Pt has daughter and son, girlfriend, and friend Bernette Redbird and his wife     Barriers   Psychosocial  barriers to participate in program The patient should benefit from training in stress management  and relaxation.      Screening Interventions   Interventions Encouraged to exercise    Expected Outcomes Short Term goal: Utilizing psychosocial counselor, staff and physician to assist with identification of specific Stressors or current issues interfering with healing process. Setting desired goal for each stressor or current issue identified.;Long Term Goal: Stressors or current issues are controlled or eliminated.;Short Term goal: Identification and review with participant of any Quality of Life or Depression concerns found by scoring the questionnaire.;Long Term goal: The participant improves quality of Life and PHQ9 Scores as seen by post scores and/or verbalization of changes             Quality of Life Scores:  Quality of Life - 08/11/22 1340       Quality of Life   Select Quality of Life      Quality of Life Scores   Health/Function Pre 20.07 %    Socioeconomic Pre 23.5 %    Psych/Spiritual Pre 24.83 %    Family Pre 30 %    GLOBAL Pre 23.17 %            Scores of 19 and below usually indicate a poorer quality of life in these areas.  A difference of  2-3 points is a clinically meaningful difference.  A difference of 2-3 points in the total score of the Quality of Life Index has been associated with significant improvement in overall quality of life, self-image, physical symptoms, and general health in studies assessing change in quality of life.  PHQ-9: Review Flowsheet       08/11/2022 07/30/2014 07/16/2014 07/10/2014  Depression screen PHQ 2/9  Decreased Interest 1 0 1 1  Down, Depressed, Hopeless 1 2 1 2   PHQ - 2 Score 2 2 2 3   Altered sleeping 1 2 - -  Tired, decreased energy 1 2 - -  Change in appetite 1 1 - -  Feeling bad or failure about yourself  0 2 - -  Trouble concentrating 0 2 - -  Moving slowly or fidgety/restless 0 2 - -  Suicidal thoughts 0 0 - -  PHQ-9  Score 5 13 - -  Difficult doing work/chores Not difficult at all Somewhat difficult - -   Interpretation of Total Score  Total Score Depression Severity:  1-4 = Minimal depression, 5-9 = Mild depression, 10-14 = Moderate depression, 15-19 = Moderately severe depression, 20-27 = Severe depression   Psychosocial Evaluation and Intervention:   Psychosocial Re-Evaluation:   Psychosocial Discharge (Final Psychosocial Re-Evaluation):   Vocational Rehabilitation: Provide vocational rehab assistance to qualifying candidates.   Vocational Rehab Evaluation & Intervention:  Vocational Rehab - 08/11/22 1117       Initial Vocational Rehab Evaluation & Intervention   Assessment shows need for Vocational Rehabilitation No   Pt is retired            Education: Education Goals: Education classes will be provided on a weekly basis, covering required topics. Participant will state understanding/return demonstration of topics presented.     Core Videos: Exercise    Move It!  Clinical staff conducted group or individual video education with verbal and written material and guidebook.  Patient learns the recommended Pritikin exercise program. Exercise with the goal of living a long, healthy life. Some of the health benefits of exercise include controlled diabetes, healthier blood pressure levels, improved cholesterol levels, improved heart and lung capacity, improved sleep, and better body composition. Everyone should speak with their doctor before starting  or changing an exercise routine.  Biomechanical Limitations Clinical staff conducted group or individual video education with verbal and written material and guidebook.  Patient learns how biomechanical limitations can impact exercise and how we can mitigate and possibly overcome limitations to have an impactful and balanced exercise routine.  Body Composition Clinical staff conducted group or individual video education with verbal and  written material and guidebook.  Patient learns that body composition (ratio of muscle mass to fat mass) is a key component to assessing overall fitness, rather than body weight alone. Increased fat mass, especially visceral belly fat, can put [place] at increased risk for metabolic syndrome, type 2 diabetes, heart disease, and even death. It is recommended to combine diet and exercise (cardiovascular and resistance training) to improve your body composition. Seek guidance from your physician and exercise physiologist before implementing an exercise routine.  Exercise Action Plan Clinical staff conducted group or individual video education with verbal and written material and guidebook.  Patient learns the recommended strategies to achieve and enjoy long-term exercise adherence, including variety, self-motivation, self-efficacy, and positive decision making. Benefits of exercise include fitness, good health, weight management, more energy, better sleep, less stress, and overall well-being.  Medical   Heart Disease Risk Reduction Clinical staff conducted group or individual video education with verbal and written material and guidebook.  Patient learns our heart is our most vital organ as it circulates oxygen, nutrients, white blood cells, and hormones throughout the entire body, and carries waste away. Data supports a plant-based eating plan like the Pritikin Program for its effectiveness in slowing progression of and reversing heart disease. The video provides a number of recommendations to address heart disease.   Metabolic Syndrome and Belly Fat  Clinical staff conducted group or individual video education with verbal and written material and guidebook.  Patient learns what metabolic syndrome is, how it leads to heart disease, and how one can reverse it and keep it from coming back. You have metabolic syndrome if you have 3 of the following 5 criteria: abdominal obesity, high blood pressure, high  triglycerides, low HDL cholesterol, and high blood sugar.  Hypertension and Heart Disease Clinical staff conducted group or individual video education with verbal and written material and guidebook.  Patient learns that high blood pressure, or hypertension, is very common in the [place]. Hypertension is largely due to excessive salt intake, but other important risk factors include being overweight, physical inactivity, drinking too much alcohol, smoking, and not eating enough potassium from fruits and vegetables. High blood pressure is a leading risk factor for heart attack, stroke, congestive heart failure, dementia, kidney failure, and premature death. Long-term effects of excessive salt intake include stiffening of the arteries and thickening of heart muscle and organ damage. Recommendations include ways to reduce hypertension and the risk of heart disease.  Diseases of Our Time - Focusing on Diabetes Clinical staff conducted group or individual video education with verbal and written material and guidebook.  Patient learns why the best way to stop diseases of our time is prevention, through food and other lifestyle changes. Medicine (such as prescription pills and surgeries) is often only a Band-Aid on the problem, not a long-term solution. Most common diseases of our time include obesity, type 2 diabetes, hypertension, heart disease, and cancer. The Pritikin Program is recommended and has been proven to help reduce, reverse, and/or prevent the damaging effects of metabolic syndrome.  Nutrition   Overview of the Pritikin Eating Plan  Clinical staff conducted  group or individual video education with verbal and written material and guidebook.  Patient learns about the Pritikin Eating Plan for disease risk reduction. The Pritikin Eating Plan emphasizes a wide variety of unrefined, minimally-processed carbohydrates, like fruits, vegetables, whole grains, and legumes. Go, Caution, and Stop food  choices are explained. Plant-based and lean animal proteins are emphasized. Rationale provided for low sodium intake for blood pressure control, low added sugars for blood sugar stabilization, and low added fats and oils for coronary artery disease risk reduction and weight management.  Calorie Density  Clinical staff conducted group or individual video education with verbal and written material and guidebook.  Patient learns about calorie density and how it impacts the Pritikin Eating Plan. Knowing the characteristics of the food you choose will help you decide whether those foods will lead to weight gain or weight loss, and whether you want to consume more or less of them. Weight loss is usually a side effect of the Pritikin Eating Plan because of its focus on low calorie-dense foods.  Label Reading  Clinical staff conducted group or individual video education with verbal and written material and guidebook.  Patient learns about the Pritikin recommended label reading guidelines and corresponding recommendations regarding calorie density, added sugars, sodium content, and whole grains.  Dining Out - Part 1  Clinical staff conducted group or individual video education with verbal and written material and guidebook.  Patient learns that restaurant meals can be sabotaging because they can be so high in calories, fat, sodium, and/or sugar. Patient learns recommended strategies on how to positively address this and avoid unhealthy pitfalls.  Facts on Fats  Clinical staff conducted group or individual video education with verbal and written material and guidebook.  Patient learns that lifestyle modifications can be just as effective, if not more so, as many medications for lowering your risk of heart disease. A Pritikin lifestyle can help to reduce your risk of inflammation and atherosclerosis (cholesterol build-up, or plaque, in the artery walls). Lifestyle interventions such as dietary choices and  physical activity address the cause of atherosclerosis. A review of the types of fats and their impact on blood cholesterol levels, along with dietary recommendations to reduce fat intake is also included.  Nutrition Action Plan  Clinical staff conducted group or individual video education with verbal and written material and guidebook.  Patient learns how to incorporate Pritikin recommendations into their lifestyle. Recommendations include planning and keeping personal health goals in mind as an important part of their success.  Healthy Mind-Set    Healthy Minds, Bodies, Hearts  Clinical staff conducted group or individual video education with verbal and written material and guidebook.  Patient learns how to identify when they are stressed. Video will discuss the impact of that stress, as well as the many benefits of stress management. Patient will also be introduced to stress management techniques. The way we think, act, and feel has an impact on our hearts.  How Our Thoughts Can Heal Our Hearts  Clinical staff conducted group or individual video education with verbal and written material and guidebook.  Patient learns that negative thoughts can cause depression and anxiety. This can result in negative lifestyle behavior and serious health problems. Cognitive behavioral therapy is an effective method to help control our thoughts in order to change and improve our emotional outlook.  Additional Videos:  Exercise    Improving Performance  Clinical staff conducted group or individual video education with verbal and written material and guidebook.  Patient learns to use a non-linear approach by alternating intensity levels and lengths of time spent exercising to help burn more calories and lose more body fat. Cardiovascular exercise helps improve heart health, metabolism, hormonal balance, blood sugar control, and recovery from fatigue. Resistance training improves strength, endurance, balance,  coordination, reaction time, metabolism, and muscle mass. Flexibility exercise improves circulation, posture, and balance. Seek guidance from your physician and exercise physiologist before implementing an exercise routine and learn your capabilities and proper form for all exercise.  Introduction to Yoga  Clinical staff conducted group or individual video education with verbal and written material and guidebook.  Patient learns about yoga, a discipline of the coming together of mind, breath, and body. The benefits of yoga include improved flexibility, improved range of motion, better posture and core strength, increased lung function, weight loss, and positive self-image. Yoga's heart health benefits include lowered blood pressure, healthier heart rate, decreased cholesterol and triglyceride levels, improved immune function, and reduced stress. Seek guidance from your physician and exercise physiologist before implementing an exercise routine and learn your capabilities and proper form for all exercise.  Medical   Aging: Enhancing Your Quality of Life  Clinical staff conducted group or individual video education with verbal and written material and guidebook.  Patient learns key strategies and recommendations to stay in good physical health and enhance quality of life, such as prevention strategies, having an advocate, securing a Health Care Proxy and Power of Attorney, and keeping a list of medications and system for tracking them. It also discusses how to avoid risk for bone loss.  Biology of Weight Control  Clinical staff conducted group or individual video education with verbal and written material and guidebook.  Patient learns that weight gain occurs because we consume more calories than we burn (eating more, moving less). Even if your body weight is normal, you may have higher ratios of fat compared to muscle mass. Too much body fat puts you at increased risk for cardiovascular disease, heart  attack, stroke, type 2 diabetes, and obesity-related cancers. In addition to exercise, following the Pritikin Eating Plan can help reduce your risk.  Decoding Lab Results  Clinical staff conducted group or individual video education with verbal and written material and guidebook.  Patient learns that lab test reflects one measurement whose values change over time and are influenced by many factors, including medication, stress, sleep, exercise, food, hydration, pre-existing medical conditions, and more. It is recommended to use the knowledge from this video to become more involved with your lab results and evaluate your numbers to speak with your doctor.   Diseases of Our Time - Overview  Clinical staff conducted group or individual video education with verbal and written material and guidebook.  Patient learns that according to the CDC, 50% to 70% of chronic diseases (such as obesity, type 2 diabetes, elevated lipids, hypertension, and heart disease) are avoidable through lifestyle improvements including healthier food choices, listening to satiety cues, and increased physical activity.  Sleep Disorders Clinical staff conducted group or individual video education with verbal and written material and guidebook.  Patient learns how good quality and duration of sleep are important to overall health and well-being. Patient also learns about sleep disorders and how they impact health along with recommendations to address them, including discussing with a physician.  Nutrition  Dining Out - Part 2 Clinical staff conducted group or individual video education with verbal and written material and guidebook.  Patient learns how to plan ahead  and communicate in order to maximize their dining experience in a healthy and nutritious manner. Included are recommended food choices based on the type of restaurant the patient is visiting.   Fueling a Banker conducted group or individual  video education with verbal and written material and guidebook.  There is a strong connection between our food choices and our health. Diseases like obesity and type 2 diabetes are very prevalent and are in large-part due to lifestyle choices. The Pritikin Eating Plan provides plenty of food and hunger-curbing satisfaction. It is easy to follow, affordable, and helps reduce health risks.  Menu Workshop  Clinical staff conducted group or individual video education with verbal and written material and guidebook.  Patient learns that restaurant meals can sabotage health goals because they are often packed with calories, fat, sodium, and sugar. Recommendations include strategies to plan ahead and to communicate with the manager, chef, or server to help order a healthier meal.  Planning Your Eating Strategy  Clinical staff conducted group or individual video education with verbal and written material and guidebook.  Patient learns about the Pritikin Eating Plan and its benefit of reducing the risk of disease. The Pritikin Eating Plan does not focus on calories. Instead, it emphasizes high-quality, nutrient-rich foods. By knowing the characteristics of the foods, we choose, we can determine their calorie density and make informed decisions.  Targeting Your Nutrition Priorities  Clinical staff conducted group or individual video education with verbal and written material and guidebook.  Patient learns that lifestyle habits have a tremendous impact on disease risk and progression. This video provides eating and physical activity recommendations based on your personal health goals, such as reducing LDL cholesterol, losing weight, preventing or controlling type 2 diabetes, and reducing high blood pressure.  Vitamins and Minerals  Clinical staff conducted group or individual video education with verbal and written material and guidebook.  Patient learns different ways to obtain key vitamins and minerals,  including through a recommended healthy diet. It is important to discuss all supplements you take with your doctor.   Healthy Mind-Set    Smoking Cessation  Clinical staff conducted group or individual video education with verbal and written material and guidebook.  Patient learns that cigarette smoking and tobacco addiction pose a serious health risk which affects millions of people. Stopping smoking will significantly reduce the risk of heart disease, lung disease, and many forms of cancer. Recommended strategies for quitting are covered, including working with your doctor to develop a successful plan.  Culinary   Becoming a Set designer conducted group or individual video education with verbal and written material and guidebook.  Patient learns that cooking at home can be healthy, cost-effective, quick, and puts them in control. Keys to cooking healthy recipes will include looking at your recipe, assessing your equipment needs, planning ahead, making it simple, choosing cost-effective seasonal ingredients, and limiting the use of added fats, salts, and sugars.  Cooking - Breakfast and Snacks  Clinical staff conducted group or individual video education with verbal and written material and guidebook.  Patient learns how important breakfast is to satiety and nutrition through the entire day. Recommendations include key foods to eat during breakfast to help stabilize blood sugar levels and to prevent overeating at meals later in the day. Planning ahead is also a key component.  Cooking - Educational psychologist conducted group or individual video education with verbal and written material and guidebook.  Patient learns eating strategies to improve overall health, including an approach to cook more at home. Recommendations include thinking of animal protein as a side on your plate rather than center stage and focusing instead on lower calorie dense options like vegetables,  fruits, whole grains, and plant-based proteins, such as beans. Making sauces in large quantities to freeze for later and leaving the skin on your vegetables are also recommended to maximize your experience.  Cooking - Healthy Salads and Dressing Clinical staff conducted group or individual video education with verbal and written material and guidebook.  Patient learns that vegetables, fruits, whole grains, and legumes are the foundations of the Pritikin Eating Plan. Recommendations include how to incorporate each of these in flavorful and healthy salads, and how to create homemade salad dressings. Proper handling of ingredients is also covered. Cooking - Soups and [employer] - Soups and Desserts Clinical staff conducted group or individual video education with verbal and written material and guidebook.  Patient learns that Pritikin soups and desserts make for easy, nutritious, and delicious snacks and meal components that are low in sodium, fat, sugar, and calorie density, while high in vitamins, minerals, and filling fiber. Recommendations include simple and healthy ideas for soups and desserts.   Overview     The Pritikin Solution Program Overview Clinical staff conducted group or individual video education with verbal and written material and guidebook.  Patient learns that the results of the Pritikin Program have been documented in more than 100 articles published in peer-reviewed journals, and the benefits include reducing risk factors for (and, in some cases, even reversing) high cholesterol, high blood pressure, type 2 diabetes, obesity, and more! An overview of the three key pillars of the Pritikin Program will be covered: eating well, doing regular exercise, and having a healthy mind-set.  WORKSHOPS  Exercise: Exercise Basics: Building Your Action Plan Clinical staff led group instruction and group discussion with PowerPoint presentation and patient guidebook. To enhance the  learning environment the use of posters, models and videos may be added. At the conclusion of this workshop, patients will comprehend the difference between physical activity and exercise, as well as the benefits of incorporating both, into their routine. Patients will understand the FITT (Frequency, Intensity, Time, and Type) principle and how to use it to build an exercise action plan. In addition, safety concerns and other considerations for exercise and cardiac rehab will be addressed by the presenter. The purpose of this lesson is to promote a comprehensive and effective weekly exercise routine in order to improve patients' overall level of fitness.   Managing Heart Disease: Your Path to a Healthier Heart Clinical staff led group instruction and group discussion with PowerPoint presentation and patient guidebook. To enhance the learning environment the use of posters, models and videos may be added.At the conclusion of this workshop, patients will understand the anatomy and physiology of the heart. Additionally, they will understand how Pritikin's three pillars impact the risk factors, the progression, and the management of heart disease.  The purpose of this lesson is to provide a high-level overview of the heart, heart disease, and how the Pritikin lifestyle positively impacts risk factors.  Exercise Biomechanics Clinical staff led group instruction and group discussion with PowerPoint presentation and patient guidebook. To enhance the learning environment the use of posters, models and videos may be added. Patients will learn how the structural parts of their bodies function and how these functions impact their daily activities, movement, and exercise. Patients will  learn how to promote a neutral spine, learn how to manage pain, and identify ways to improve their physical movement in order to promote healthy living. The purpose of this lesson is to expose patients to common  physical limitations that impact physical activity. Participants will learn practical ways to adapt and manage aches and pains, and to minimize their effect on regular exercise. Patients will learn how to maintain good posture while sitting, walking, and lifting.  Balance Training and Fall Prevention  Clinical staff led group instruction and group discussion with PowerPoint presentation and patient guidebook. To enhance the learning environment the use of posters, models and videos may be added. At the conclusion of this workshop, patients will understand the importance of their sensorimotor skills (vision, proprioception, and the vestibular system) in maintaining their ability to balance as they age. Patients will apply a variety of balancing exercises that are appropriate for their current level of function. Patients will understand the common causes for poor balance, possible solutions to these problems, and ways to modify their physical environment in order to minimize their fall risk. The purpose of this lesson is to teach patients about the importance of maintaining balance as they age and ways to minimize their risk of falling.  WORKSHOPS   Nutrition:  Fueling a Ship broker led group instruction and group discussion with PowerPoint presentation and patient guidebook. To enhance the learning environment the use of posters, models and videos may be added. Patients will review the foundational principles of the Pritikin Eating Plan and understand what constitutes a serving size in each of the food groups. Patients will also learn Pritikin-friendly foods that are better choices when away from home and review make-ahead meal and snack options. Calorie density will be reviewed and applied to three nutrition priorities: weight maintenance, weight loss, and weight gain. The purpose of this lesson is to reinforce (in a group setting) the key concepts around what patients are  recommended to eat and how to apply these guidelines when away from home by planning and selecting Pritikin-friendly options. Patients will understand how calorie density may be adjusted for different weight management goals.  Mindful Eating  Clinical staff led group instruction and group discussion with PowerPoint presentation and patient guidebook. To enhance the learning environment the use of posters, models and videos may be added. Patients will briefly review the concepts of the Pritikin Eating Plan and the importance of low-calorie dense foods. The concept of mindful eating will be introduced as well as the importance of paying attention to internal hunger signals. Triggers for non-hunger eating and techniques for dealing with triggers will be explored. The purpose of this lesson is to provide patients with the opportunity to review the basic principles of the Pritikin Eating Plan, discuss the value of eating mindfully and how to measure internal cues of hunger and fullness using the Hunger Scale. Patients will also discuss reasons for non-hunger eating and learn strategies to use for controlling emotional eating.  Targeting Your Nutrition Priorities Clinical staff led group instruction and group discussion with PowerPoint presentation and patient guidebook. To enhance the learning environment the use of posters, models and videos may be added. Patients will learn how to determine their genetic susceptibility to disease by reviewing their family history. Patients will gain insight into the importance of diet as part of an overall healthy lifestyle in mitigating the impact of genetics and other environmental insults. The purpose of this lesson is to provide patients with the opportunity  to assess their personal nutrition priorities by looking at their family history, their own health history and current risk factors. Patients will also be able to discuss ways of prioritizing and modifying the Pritikin  Eating Plan for their highest risk areas  Menu  Clinical staff led group instruction and group discussion with PowerPoint presentation and patient guidebook. To enhance the learning environment the use of posters, models and videos may be added. Using menus brought in from [employer], or printed from [employer], patients will apply the Pritikin dining out guidelines that were presented in the [employer] video. Patients will also be able to practice these guidelines in a variety of provided scenarios. The purpose of this lesson is to provide patients with the opportunity to practice hands-on learning of the Pritikin Dining Out guidelines with actual menus and practice scenarios.  Label Reading Clinical staff led group instruction and group discussion with PowerPoint presentation and patient guidebook. To enhance the learning environment the use of posters, models and videos may be added. Patients will review and discuss the Pritikin label reading guidelines presented in Pritikin's Label Reading Educational series video. Using fool labels brought in from local grocery stores and markets, patients will apply the label reading guidelines and determine if the packaged food meet the Pritikin guidelines. The purpose of this lesson is to provide patients with the opportunity to review, discuss, and practice hands-on learning of the Pritikin Label Reading guidelines with actual packaged food labels. Cooking School  Pritikin's [employer] are designed to teach patients ways to prepare quick, simple, and affordable recipes at home. The importance of nutrition's role in chronic disease risk reduction is reflected in its emphasis in the overall Pritikin program. By learning how to prepare essential core Pritikin Eating Plan recipes, patients will increase control over what they eat; be able to customize the flavor of foods without the use of added salt, sugar, or fat; and  improve the quality of the food they consume. By learning a set of core recipes which are easily assembled, quickly prepared, and affordable, patients are more likely to prepare more healthy foods at home. These workshops focus on convenient breakfasts, simple entres, side dishes, and desserts which can be prepared with minimal effort and are consistent with nutrition recommendations for cardiovascular risk reduction. Cooking [employer] are taught by a Armed forces logistics/support/administrative officer (RD) who has been trained by the [employer]. The chef or RD has a clear understanding of the importance of minimizing - if not completely eliminating - added fat, sugar, and sodium in recipes. Throughout the series of Cooking School Workshop sessions, patients will learn about healthy ingredients and efficient methods of cooking to build confidence in their capability to prepare    Cooking School weekly topics:  Adding Flavor- Sodium-Free  Fast and Healthy Breakfasts  Powerhouse Plant-Based Proteins  Satisfying Salads and Dressings  Simple Sides and Sauces  International Cuisine-Spotlight on the [employer] Zones  Delicious Desserts  Savory Soups  [employer] - Meals in a Astronomer Appetizers and Snacks  Comforting Weekend Breakfasts  One-Pot Wonders   Fast Evening Meals  Landscape architect Your Pritikin Plate  WORKSHOPS   Healthy Mindset (Psychosocial):  Focused Goals, Sustainable Changes Clinical staff led group instruction and group discussion with PowerPoint presentation and patient guidebook. To enhance the learning environment the use of posters, models and videos may be added. Patients will be able to apply effective goal setting strategies  to establish at least one personal goal, and then take consistent, meaningful action toward that goal. They will learn to identify common barriers to achieving personal goals and develop strategies to overcome them. Patients will  also gain an understanding of how our mind-set can impact our ability to achieve goals and the importance of cultivating a positive and growth-oriented mind-set. The purpose of this lesson is to provide patients with a deeper understanding of how to set and achieve personal goals, as well as the tools and strategies needed to overcome common obstacles which may arise along the way.  From Head to Heart: The Power of a Healthy Outlook  Clinical staff led group instruction and group discussion with PowerPoint presentation and patient guidebook. To enhance the learning environment the use of posters, models and videos may be added. Patients will be able to recognize and describe the impact of emotions and mood on physical health. They will discover the importance of self-care and explore self-care practices which may work for them. Patients will also learn how to utilize the 4 C's to cultivate a healthier outlook and better manage stress and challenges. The purpose of this lesson is to demonstrate to patients how a healthy outlook is an essential part of maintaining good health, especially as they continue their cardiac rehab journey.  Healthy Sleep for a Healthy Heart Clinical staff led group instruction and group discussion with PowerPoint presentation and patient guidebook. To enhance the learning environment the use of posters, models and videos may be added. At the conclusion of this workshop, patients will be able to demonstrate knowledge of the importance of sleep to overall health, well-being, and quality of life. They will understand the symptoms of, and treatments for, common sleep disorders. Patients will also be able to identify daytime and nighttime behaviors which impact sleep, and they will be able to apply these tools to help manage sleep-related challenges. The purpose of this lesson is to provide patients with a general overview of sleep and outline the importance of quality sleep. Patients will  learn about a few of the most common sleep disorders. Patients will also be introduced to the concept of "sleep hygiene," and discover ways to self-manage certain sleeping problems through simple daily behavior changes. Finally, the workshop will motivate patients by clarifying the links between quality sleep and their goals of heart-healthy living.   Recognizing and Reducing Stress Clinical staff led group instruction and group discussion with PowerPoint presentation and patient guidebook. To enhance the learning environment the use of posters, models and videos may be added. At the conclusion of this workshop, patients will be able to understand the types of stress reactions, differentiate between acute and chronic stress, and recognize the impact that chronic stress has on their health. They will also be able to apply different coping mechanisms, such as reframing negative self-talk. Patients will have the opportunity to practice a variety of stress management techniques, such as deep abdominal breathing, progressive muscle relaxation, and/or guided imagery.  The purpose of this lesson is to educate patients on the role of stress in their lives and to provide healthy techniques for coping with it.  Learning Barriers/Preferences:  Learning Barriers/Preferences - 08/11/22 1118       Learning Barriers/Preferences   Learning Barriers Sight    Learning Preferences Verbal Instruction;Video;Skilled Demonstration;Pictoral;Individual Instruction;Group Instruction;Computer/Internet             Education Topics:  Knowledge Questionnaire Score:  Knowledge Questionnaire Score - 08/11/22 1341  Knowledge Questionnaire Score   Pre Score 19/24             Core Components/Risk Factors/Patient Goals at Admission:  Personal Goals and Risk Factors at Admission - 08/11/22 1116       Core Components/Risk Factors/Patient Goals on Admission   Heart Failure Yes    Intervention Provide a  combined exercise and nutrition program that is supplemented with education, support and counseling about heart failure. Directed toward relieving symptoms such as shortness of breath, decreased exercise tolerance, and extremity edema.    Expected Outcomes Improve functional capacity of life;Short term: Attendance in program 2-3 days a week with increased exercise capacity. Reported lower sodium intake. Reported increased fruit and vegetable intake. Reports medication compliance.;Short term: Daily weights obtained and reported for increase. Utilizing diuretic protocols set by physician.;Long term: Adoption of self-care skills and reduction of barriers for early signs and symptoms recognition and intervention leading to self-care maintenance.    Hypertension Yes    Intervention Provide education on lifestyle modifcations including regular physical activity/exercise, weight management, moderate sodium restriction and increased consumption of fresh fruit, vegetables, and low fat dairy, alcohol moderation, and smoking cessation.;Monitor prescription use compliance.    Expected Outcomes Short Term: Continued assessment and intervention until BP is < 140/63mm HG in hypertensive participants. < 130/58mm HG in hypertensive participants with diabetes, heart failure or chronic kidney disease.;Long Term: Maintenance of blood pressure at goal levels.    Lipids Yes    Intervention Provide education and support for participant on nutrition & aerobic/resistive exercise along with prescribed medications to achieve LDL 70mg , HDL >40mg .    Expected Outcomes Short Term: Participant states understanding of desired cholesterol values and is compliant with medications prescribed. Participant is following exercise prescription and nutrition guidelines.;Long Term: Cholesterol controlled with medications as prescribed, with individualized exercise RX and with personalized nutrition plan. Value goals: LDL < 70mg , HDL > 40 mg.              Core Components/Risk Factors/Patient Goals Review:    Core Components/Risk Factors/Patient Goals at Discharge (Final Review):    ITP Comments:  ITP Comments     Row Name 08/11/22 0927           ITP Comments Armanda Magic, MD: Medical Director.  Introduction to the [facility]/Intensive Cardiac Rehab.  Initial orientation packet reviewed with the patient.                Comments: Participant attended orientation for the cardiac rehabilitation program on  08/11/2022  to perform initial intake and exercise walk test. Patient introduced to the [facility] education and orientation packet was reviewed. Completed 6-minute walk test, measurements, initial ITP, and exercise prescription. Vital signs stable. Telemetry-normal sinus rhythm,LBBB and frequent PVC's, asymptomatic.   Service time was from 9:17 to 11:40.

## 2022-08-11 NOTE — Progress Notes (Signed)
Cardiac Rehab Medication Review   Does the patient  feel that his/her medications are working for him/her?  yes  Has the patient been experiencing any side effects to the medications prescribed?  no  Does the patient measure his/her own blood pressure or blood glucose at home?  yes   Does the patient have any problems obtaining medications due to transportation or finances?   No  Understanding of regimen: good Understanding of indications: good Potential of compliance: good    Comments: Pt checks blood pressure daily at home. Has good understanding of his medications.   Lorin Picket 08/11/2022 10:06 AM

## 2022-08-19 ENCOUNTER — Encounter (HOSPITAL_COMMUNITY)
Admission: RE | Admit: 2022-08-19 | Discharge: 2022-08-19 | Disposition: A | Discharge status: Home / Self Care | Payer: Medicare Other | Source: Ambulatory Visit | Attending: Cardiology | Admitting: Cardiology

## 2022-08-19 DIAGNOSIS — Z955 Presence of coronary angioplasty implant and graft: Secondary | ICD-10-CM

## 2022-08-19 NOTE — Progress Notes (Signed)
Daily Session Note  Patient Details  Name: Steven Brady MRN: [id number] Date of Birth: [birth year] Referring Provider:   Flowsheet Row INTENSIVE CARDIAC REHAB ORIENT from 08/11/2022 in [facility]  Referring Provider Dr. Cherly Anderson Armanda Magic, MD covering)       Encounter Date: 08/19/2022  Check In:  Session Check In - 08/19/22 1249       Check-In   Supervising physician immediately available to respond to emergencies [facility] MD immediately available    Physician(s) Robin Searing NP    Location [facility]    Staff Present Lorin Picket, MS, ACSM-CEP, CCRP, Exercise Physiologist;Sarah Cleophas Dunker, RN, MSN;Jadence Kinlaw, RN, Cathlean Cower, MS, Exercise Physiologist;Mary Gerre Scull, RN, BSN;Johnny Porter, MS, Exercise Physiologist    Virtual Visit No    Medication changes reported     No    Fall or balance concerns reported    No    Tobacco Cessation No Change    Warm-up and Cool-down Performed as group-led instruction    Resistance Training Performed No    VAD Patient? No    PAD/SET Patient? No      Pain Assessment   Currently in Pain? No/denies    Pain Score 0-No pain    Multiple Pain Sites No             Capillary Blood Glucose: No results found for this or any previous visit (from the past 24 hour(s)).   Exercise Prescription Changes - 08/19/22 1400       Response to Exercise   Blood Pressure (Admit) 106/70    Blood Pressure (Exercise) 152/84    Blood Pressure (Exit) 112/60    Heart Rate (Admit) 81 bpm    Heart Rate (Exercise) 118 bpm    Heart Rate (Exit) 89 bpm    Rating of Perceived Exertion (Exercise) 10    Symptoms None    Comments Pt's first day in the [facility] program    Duration Continue with 30 min of aerobic exercise without signs/symptoms of physical distress.    Intensity THRR unchanged      Progression   Progression Continue to progress workloads to maintain intensity without  signs/symptoms of physical distress.    Average METs 2.3      Resistance Training   Training Prescription No    Weight No weights on wednesday      Interval Training   Interval Training No      NuStep   Level 1    SPM 87    Minutes 15    METs 2.3      Arm Ergometer   Level 1    Minutes 15             Social History   Tobacco Use  Smoking Status Light Smoker   Packs/day: .25   Types: Cigarettes  Smokeless Tobacco Never    Goals Met:  Exercise tolerated well No report of concerns or symptoms today  Goals Unmet:  Not Applicable  Comments: Pt started cardiac rehab today.  Pt tolerated light exercise without difficulty. VSS, telemetry-Sinus Rhythm, Bundle Branch Block, asymptomatic.  Medication list reconciled. Pt denies barriers to medicaiton compliance.  PSYCHOSOCIAL ASSESSMENT:  PHQ-5. Pt exhibits positive coping skills, hopeful outlook with supportive family. No psychosocial needs identified at this time, no psychosocial interventions necessary.    Pt enjoys music, guitar, cooking, good documentaries on TV, spending time with family.   Pt oriented to exercise equipment  and routine.    Understanding verbalized.Thayer Headings RN BSN     Dr. Armanda Magic is Medical Director for Cardiac Rehab at [facility].

## 2022-08-21 ENCOUNTER — Encounter (HOSPITAL_COMMUNITY)
Admission: RE | Admit: 2022-08-21 | Discharge: 2022-08-21 | Disposition: A | Discharge status: Home / Self Care | Payer: Medicare Other | Source: Ambulatory Visit | Attending: Cardiology | Admitting: Cardiology

## 2022-08-21 DIAGNOSIS — Z955 Presence of coronary angioplasty implant and graft: Secondary | ICD-10-CM

## 2022-08-24 ENCOUNTER — Encounter (HOSPITAL_COMMUNITY): Payer: Medicare Other

## 2022-08-25 NOTE — Progress Notes (Signed)
Cardiac Individual Treatment Plan  Patient Details  Name: Steven Brady MRN: [id number] Date of Birth: 07-13-1955 Referring Provider:   Flowsheet Row INTENSIVE CARDIAC REHAB ORIENT from 08/11/2022 in [facility]  Referring Provider Dr. Cherly Anderson (Armanda Magic, MD covering)       Initial Encounter Date:  Flowsheet Row INTENSIVE CARDIAC REHAB ORIENT from 08/11/2022 in [facility]  Date 08/11/22       Visit Diagnosis: 07/13/22 S/P coronary artery stent placement RCA x 3  Patient's Home Medications on Admission:  Current Outpatient Medications:    apixaban (ELIQUIS) 5 MG TABS tablet, Take 5 mg by mouth 2 (two) times daily., Disp: , Rfl:    atorvastatin (LIPITOR) 40 MG tablet, Take 40 mg by mouth daily., Disp: , Rfl:    carvedilol (COREG) 12.5 MG tablet, Take 1 tablet (12.5 mg total) by mouth 2 (two) times daily., Disp: 60 tablet, Rfl: 1   Cholecalciferol (VITAMIN D3) 2000 UNITS TABS, Take 2,000 Units by mouth daily., Disp: 30 tablet, Rfl: 11   clopidogrel (PLAVIX) 75 MG tablet, Take 75 mg by mouth daily., Disp: , Rfl:    dapagliflozin propanediol (FARXIGA) 10 MG TABS tablet, Take 10 mg by mouth daily., Disp: , Rfl:    furosemide (LASIX) 40 MG tablet, Take 40 mg by mouth daily as needed for fluid or edema., Disp: , Rfl:    hydrALAZINE (APRESOLINE) 25 MG tablet, Take 1 tablet (25 mg total) by mouth every 8 (eight) hours. (Patient taking differently: Take 50 mg by mouth every 8 (eight) hours.), Disp: 90 tablet, Rfl: 1   meclizine (ANTIVERT) 25 MG tablet, Take 1 tablet (25 mg total) by mouth 2 (two) times daily., Disp: 60 tablet, Rfl: 1   Multiple Vitamins-Minerals (CENTRUM ADULTS PO), Take 1 tablet by mouth daily., Disp: , Rfl:    potassium chloride SA (KLOR-CON M) 20 MEQ tablet, Take 20 mEq by mouth daily. Takes 2 tablets for a total of 40 meq, Disp: , Rfl:     sacubitril-valsartan (ENTRESTO) 97-103 MG, Take 1 tablet by mouth 2 (two) times daily., Disp: , Rfl:    spironolactone (ALDACTONE) 25 MG tablet, Take 25 mg by mouth daily., Disp: , Rfl:    UNABLE TO FIND, Outpatient vestibular rehabilitation. 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   recently hurt left ankle - 2 weeks ago   CHF (congestive heart failure) (HCC) 2015   Hematuria - cause not known 06/08/11   pt has been seeing blood off and on in urine   Hyperlipidemia Dx 2015   Hypertension Dx 2015   Tobacco abuse    since age of 41    Tobacco Use: Social History   Tobacco Use  Smoking Status Light Smoker   Packs/day: .25   Types: Cigarettes  Smokeless Tobacco Never    Labs: Review Flowsheet       Latest Ref Rng & Units 08/25/2009 08/26/2009 06/09/2011  Labs for ITP Cardiac and Pulmonary Rehab  Cholestrol 0 - 200 mg/dL - 409        ATP III CLASSIFICATION:  <200     mg/dL   Desirable  811-914  mg/dL   Borderline High  >=782    mg/dL   High         956   LDL (calc) 0 -  99 mg/dL - 161        Total Cholesterol/HDL:CHD Risk Coronary Heart Disease Risk Table                     Men   Women  1/2 Average Risk   3.4   3.3  Average Risk       5.0   4.4  2 X Average Risk   9.6   7.1  3 X Average Risk  23.4   11.0        Use the calculated Patient Ratio above and the CHD Risk Table to determine the patient's CHD Risk.        ATP III CLASSIFICATION (LDL):  <100     mg/dL   Optimal  096-045  mg/dL   Near or Above                    Optimal  130-159  mg/dL   Borderline  409-811  mg/dL   High  >914     mg/dL   Very High  782   HDL-C >39 mg/dL - 46  43   Trlycerides <150 mg/dL - 68  48   Hemoglobin N5A <5.7 % 6.0 (NOTE)                                                                       According to the ADA Clinical Practice Recommendations for 2011, when HbA1c is used as a  screening test:   >=6.5%   Diagnostic of Diabetes Mellitus           (if abnormal result  is confirmed)  5.7-6.4%   Increased risk of developing Diabetes Mellitus  References:Diagnosis and Classification of Diabetes Mellitus,Diabetes Care,2011,34(Suppl 1):S62-S69 and Standards of Medical Care in         Diabetes - 2011,Diabetes OZHY,8657,84  (Suppl 1):S11-S61.  - -    Capillary Blood Glucose: Lab Results  Component Value Date   GLUCAP 177 (H) 08/27/2009   GLUCAP 114 (H) 08/27/2009     Exercise Target Goals: Exercise Program Goal: Individual exercise prescription set using results from initial 6 min walk test and THRR while considering  patient's activity barriers and safety.   Exercise Prescription Goal: Initial exercise prescription builds to 30-45 minutes a day of aerobic activity, 2-3 days per week.  Home exercise guidelines will be given to patient during program as part of exercise prescription that the participant will acknowledge.  Activity Barriers & Risk Stratification:  Activity Barriers & Cardiac Risk Stratification - 08/11/22 1112       Activity Barriers & Cardiac Risk Stratification   Activity Barriers Arthritis;Neck/Spine Problems;Back Problems;Joint Problems;Balance Concerns;Deconditioning;Assistive Device;Other (comment)    Comments S/P CVA with rt side residual deficits, Pt with h/o vertigo    Cardiac Risk Stratification High             6 Minute Walk:  6 Minute Walk     Row Name 08/11/22 1110         6 Minute Walk   Phase Initial  Pt used wheeled walker instead of his cane     Distance 944 feet  Pt used wheeled walker     Walk Time 6 minutes     #  of Rest Breaks 0     MPH 1.8     METS 2.6     RPE 7     Perceived Dyspnea  0     VO2 Peak 8.95     Symptoms No     Resting HR 78 bpm     Resting BP 100/72     Resting Oxygen Saturation  98 %     Exercise Oxygen Saturation  during 6 min walk 97 %     Max Ex. HR 114 bpm     Max Ex. BP 140/82     2  Minute Post BP 124/70              Oxygen Initial Assessment:   Oxygen Re-Evaluation:   Oxygen Discharge (Final Oxygen Re-Evaluation):   Initial Exercise Prescription:  Initial Exercise Prescription - 08/11/22 1300       Date of Initial Exercise RX and Referring Provider   Date 08/11/22    Referring Provider Dr. Cherly Anderson Armanda Magic, MD covering)    Expected Discharge Date 10/23/22      NuStep   Level 1    SPM 80    Minutes 15    METs 2.6      Arm Ergometer   Level 1    Watts 25    RPM 60    Minutes 15    METs 2.6      Prescription Details   Frequency (times per week) 3    Duration Progress to 30 minutes of continuous aerobic without signs/symptoms of physical distress      Intensity   THRR 40-80% of Max Heartrate 62-124    Ratings of Perceived Exertion 11-13    Perceived Dyspnea 0-4      Progression   Progression Continue progressive overload as per policy without signs/symptoms or physical distress.      Resistance Training   Training Prescription Yes    Weight 3 lbs    Reps 10-15             Perform Capillary Blood Glucose checks as needed.  Exercise Prescription Changes:   Exercise Prescription Changes     Row Name 08/19/22 1400             Response to Exercise   Blood Pressure (Admit) 106/70       Blood Pressure (Exercise) 152/84       Blood Pressure (Exit) 112/60       Heart Rate (Admit) 81 bpm       Heart Rate (Exercise) 118 bpm       Heart Rate (Exit) 89 bpm       Rating of Perceived Exertion (Exercise) 10       Symptoms None       Comments Pt's first day in the CRP2 program       Duration Continue with 30 min of aerobic exercise without signs/symptoms of physical distress.       Intensity THRR unchanged         Progression   Progression Continue to progress workloads to maintain intensity without signs/symptoms of physical distress.       Average METs 2.3         Resistance Training   Training Prescription No        Weight No weights on wednesday         Interval Training   Interval Training No         NuStep   Level  1       SPM 87       Minutes 15       METs 2.3         Arm Ergometer   Level 1       Minutes 15                Exercise Comments:   Exercise Comments     Row Name 08/19/22 1419           Exercise Comments Pt's first day in the CRP2 program. Pt exercise with no complaints. Off to a good start.                Exercise Goals and Review:   Exercise Goals     Row Name 08/11/22 1113             Exercise Goals   Increase Physical Activity Yes       Intervention Provide advice, education, support and counseling about physical activity/exercise needs.;Develop an individualized exercise prescription for aerobic and resistive training based on initial evaluation findings, risk stratification, comorbidities and participant's personal goals.       Expected Outcomes Short Term: Attend rehab on a regular basis to increase amount of physical activity.;Long Term: Add in home exercise to make exercise part of routine and to increase amount of physical activity.;Long Term: Exercising regularly at least 3-5 days a week.       Increase Strength and Stamina Yes       Intervention Provide advice, education, support and counseling about physical activity/exercise needs.;Develop an individualized exercise prescription for aerobic and resistive training based on initial evaluation findings, risk stratification, comorbidities and participant's personal goals.       Expected Outcomes Short Term: Increase workloads from initial exercise prescription for resistance, speed, and METs.;Short Term: Perform resistance training exercises routinely during rehab and add in resistance training at home;Long Term: Improve cardiorespiratory fitness, muscular endurance and strength as measured by increased METs and functional capacity ( )       Able to understand and use rate of perceived exertion  (RPE) scale Yes       Intervention Provide education and explanation on how to use RPE scale       Expected Outcomes Short Term: Able to use RPE daily in rehab to express subjective intensity level;Long Term:  Able to use RPE to guide intensity level when exercising independently       Knowledge and understanding of Target Heart Rate Range (THRR) Yes       Intervention Provide education and explanation of THRR including how the numbers were predicted and where they are located for reference       Expected Outcomes Short Term: Able to state/look up THRR;Long Term: Able to use THRR to govern intensity when exercising independently;Short Term: Able to use daily as guideline for intensity in rehab       Understanding of Exercise Prescription Yes       Intervention Provide education, explanation, and written materials on patient's individual exercise prescription       Expected Outcomes Short Term: Able to explain program exercise prescription;Long Term: Able to explain home exercise prescription to exercise independently                Exercise Goals Re-Evaluation :  Exercise Goals Re-Evaluation     Row Name 08/19/22 1417             Exercise Goal Re-Evaluation   Exercise Goals  Review Increase Physical Activity;Increase Strength and Stamina;Able to understand and use rate of perceived exertion (RPE) scale;Knowledge and understanding of Target Heart Rate Range (THRR);Understanding of Exercise Prescription       Comments Pt's first day in the CRP2 program. Pt understands the RPE scale, Exercise Rx, and THRR.       Expected Outcomes Will continue to monitor patient and progress exercise workloads as tolerated.                Discharge Exercise Prescription (Final Exercise Prescription Changes):  Exercise Prescription Changes - 08/19/22 1400       Response to Exercise   Blood Pressure (Admit) 106/70    Blood Pressure (Exercise) 152/84    Blood Pressure (Exit) 112/60    Heart Rate  (Admit) 81 bpm    Heart Rate (Exercise) 118 bpm    Heart Rate (Exit) 89 bpm    Rating of Perceived Exertion (Exercise) 10    Symptoms None    Comments Pt's first day in the CRP2 program    Duration Continue with 30 min of aerobic exercise without signs/symptoms of physical distress.    Intensity THRR unchanged      Progression   Progression Continue to progress workloads to maintain intensity without signs/symptoms of physical distress.    Average METs 2.3      Resistance Training   Training Prescription No    Weight No weights on wednesday      Interval Training   Interval Training No      NuStep   Level 1    SPM 87    Minutes 15    METs 2.3      Arm Ergometer   Level 1    Minutes 15             Nutrition:  Target Goals: Understanding of nutrition guidelines, daily intake of sodium 1500mg , cholesterol 200mg , calories 30% from fat and 7% or less from saturated fats, daily to have 5 or more servings of fruits and vegetables.  Biometrics:  Pre Biometrics - 08/11/22 0950       Pre Biometrics   Waist Circumference 44 inches    Hip Circumference 46 inches    Waist to Hip Ratio 0.96 %    Triceps Skinfold 10 mm    % Body Fat 29 %    Grip Strength 36 kg    Flexibility --   Not performed due to back problems   Single Leg Stand 2.35 seconds   High risk for fall             Nutrition Therapy Plan and Nutrition Goals:  Nutrition Therapy & Goals - 08/21/22 1409       Nutrition Therapy   Diet Heart healthy diet    Drug/Food Interactions Statins/Certain Fruits      Personal Nutrition Goals   Nutrition Goal Patient to identify strategies for reducing cardiovascular risk by attending the [employer]rit[employer]in education and nutrition series weekly.    Personal Goal #2 Patient to improve diet quality by using the plate method as a guide for meal planning to include lean protein/plant protein, fruits, vegetables, whole grains, nonfat dairy as part of a well-balanced diet.     Personal Goal #3 Patient to reduce sodium to 1500mg  per day    Comments Patient will benefit from participation in intensive cardiac rehab for nutrition, exercise, and lifestyle modification.      Intervention Plan   Intervention Prescribe, educate and counsel regarding individualized  specific dietary modifications aiming towards targeted core components such as weight, hypertension, lipid management, diabetes, heart failure and other comorbidities.;Nutrition handout(s) given to patient.    Expected Outcomes Short Term Goal: Understand basic principles of dietary content, such as calories, fat, sodium, cholesterol and nutrients.;Long Term Goal: Adherence to prescribed nutrition plan.             Nutrition Assessments:  Nutrition Assessments - 08/21/22 1414       Rate Your Plate Scores   Pre Score 58            MEDIFICTS Score Key: ?70 Need to make dietary changes  40-70 Heart Healthy Diet ? 40 Therapeutic Level Cholesterol Diet   Flowsheet Row INTENSIVE CARDIAC REHAB from 08/21/2022 in [facility]  Picture Your Plate Total Score on Admission 58      Picture Your Plate Scores: <13 Unhealthy dietary pattern with much room for improvement. 41-50 Dietary pattern unlikely to meet recommendations for good health and room for improvement. 51-60 More healthful dietary pattern, with some room for improvement.  >60 Healthy dietary pattern, although there may be some specific behaviors that could be improved.    Nutrition Goals Re-Evaluation:  Nutrition Goals Re-Evaluation     Row Name 08/21/22 1409             Goals   Current Weight 205 lb 11 oz (93.3 kg)       Comment Cr 1.37, GFR 57,       Expected Outcome Patient will benefit from participation in intensive cardiac rehab for nutrition, exercise, and lifestyle modification.                Nutrition Goals Re-Evaluation:  Nutrition Goals Re-Evaluation      Row Name 08/21/22 1409             Goals   Current Weight 205 lb 11 oz (93.3 kg)       Comment Cr 1.37, GFR 57,       Expected Outcome Patient will benefit from participation in intensive cardiac rehab for nutrition, exercise, and lifestyle modification.                Nutrition Goals Discharge (Final Nutrition Goals Re-Evaluation):  Nutrition Goals Re-Evaluation - 08/21/22 1409       Goals   Current Weight 205 lb 11 oz (93.3 kg)    Comment Cr 1.37, GFR 57,    Expected Outcome Patient will benefit from participation in intensive cardiac rehab for nutrition, exercise, and lifestyle modification.             Psychosocial: Target Goals: Acknowledge presence or absence of significant depression and/or stress, maximize coping skills, provide positive support system. Participant is able to verbalize types and ability to use techniques and skills needed for reducing stress and depression.  Initial Review & Psychosocial Screening:  Initial Psych Review & Screening - 08/11/22 1114       Initial Review   Current issues with Current Depression;Current Sleep Concerns   Pt declines counseling at this time     Family Dynamics   Good Support System? Yes   Pt has daughter and son, girlfriend, and friend Bernette Redbird and his wife     Barriers   Psychosocial barriers to participate in program The patient should benefit from training in stress management and relaxation.      Screening Interventions   Interventions Encouraged to exercise  Expected Outcomes Short Term goal: Utilizing psychosocial counselor, staff and physician to assist with identification of specific Stressors or current issues interfering with healing process. Setting desired goal for each stressor or current issue identified.;Long Term Goal: Stressors or current issues are controlled or eliminated.;Short Term goal: Identification and review with participant of any Quality of Life or Depression concerns found by  scoring the questionnaire.;Long Term goal: The participant improves quality of Life and PHQ9 Scores as seen by post scores and/or verbalization of changes             Quality of Life Scores:  Quality of Life - 08/11/22 1340       Quality of Life   Select Quality of Life      Quality of Life Scores   Health/Function Pre 20.07 %    Socioeconomic Pre 23.5 %    Psych/Spiritual Pre 24.83 %    Family Pre 30 %    GLOBAL Pre 23.17 %            Scores of 19 and below usually indicate a poorer quality of life in these areas.  A difference of  2-3 points is a clinically meaningful difference.  A difference of 2-3 points in the total score of the Quality of Life Index has been associated with significant improvement in overall quality of life, self-image, physical symptoms, and general health in studies assessing change in quality of life.  PHQ-9: Review Flowsheet       08/11/2022 07/30/2014 07/16/2014 07/10/2014  Depression screen PHQ 2/9  Decreased Interest 1 0 1 1  Down, Depressed, Hopeless 1 2 1 2   PHQ - 2 Score 2 2 2 3   Altered sleeping 1 2 - -  Tired, decreased energy 1 2 - -  Change in appetite 1 1 - -  Feeling bad or failure about yourself  0 2 - -  Trouble concentrating 0 2 - -  Moving slowly or fidgety/restless 0 2 - -  Suicidal thoughts 0 0 - -  PHQ-9 Score 5 13 - -  Difficult doing work/chores Not difficult at all Somewhat difficult - -   Interpretation of Total Score  Total Score Depression Severity:  1-4 = Minimal depression, 5-9 = Mild depression, 10-14 = Moderate depression, 15-19 = Moderately severe depression, 20-27 = Severe depression   Psychosocial Evaluation and Intervention:   Psychosocial Re-Evaluation:  Psychosocial Re-Evaluation     Row Name 08/20/22 1558             Psychosocial Re-Evaluation   Current issues with Current Depression;Current Stress Concerns       Comments Steven Brady started intensive cardiac rehab on 08/19/22. Will review PHQ-9 in  the upcoming week. Steven Brady denied concerns or stressors on his first day of exercise       Expected Outcomes Steven Brady will have decreased or controlled depression/ stressors upon completion of intensive cardiac rehab       Interventions Stress management education;Encouraged to attend Cardiac Rehabilitation for the exercise       Continue Psychosocial Services  Follow up required by staff         Initial Review   Source of Stress Concerns None Identified       Comments Will continue to monitor and offer support as needed                Psychosocial Discharge (Final Psychosocial Re-Evaluation):  Psychosocial Re-Evaluation - 08/20/22 1558       Psychosocial Re-Evaluation  Current issues with Current Depression;Current Stress Concerns    Comments Steven Brady started intensive cardiac rehab on 08/19/22. Will review PHQ-9 in the upcoming week. Steven Brady denied concerns or stressors on his first day of exercise    Expected Outcomes Steven Brady will have decreased or controlled depression/ stressors upon completion of intensive cardiac rehab    Interventions Stress management education;Encouraged to attend Cardiac Rehabilitation for the exercise    Continue Psychosocial Services  Follow up required by staff      Initial Review   Source of Stress Concerns None Identified    Comments Will continue to monitor and offer support as needed             Vocational Rehabilitation: Provide vocational rehab assistance to qualifying candidates.   Vocational Rehab Evaluation & Intervention:  Vocational Rehab - 08/11/22 1117       Initial Vocational Rehab Evaluation & Intervention   Assessment shows need for Vocational Rehabilitation No   Pt is retired            Education: Education Goals: Education classes will be provided on a weekly basis, covering required topics. Participant will state understanding/return demonstration of topics presented.    Education     Row Name 08/19/22 1300     Education    Cardiac Education Topics Pritikin   Customer service manager   Weekly Topic Adding Flavor - Sodium-Free   Instruction Review Code 1- Verbalizes Understanding   Class Start Time 1138   Class Stop Time 1210   Class Time Calculation (min) 32 min    Row Name 08/21/22 1200     Education   Cardiac Education Topics Pritikin   Hospital doctor Education   General Education Heart Disease Risk Reduction   Instruction Review Code 1- Verbalizes Understanding   Class Start Time 1135   Class Stop Time 1210   Class Time Calculation (min) 35 min            Core Videos: Exercise    Move It!  Clinical staff conducted group or individual video education with verbal and written material and guidebook.  Patient learns the recommended Pritikin exercise program. Exercise with the goal of living a long, healthy life. Some of the health benefits of exercise include controlled diabetes, healthier blood pressure levels, improved cholesterol levels, improved heart and lung capacity, improved sleep, and better body composition. Everyone should speak with their doctor before starting or changing an exercise routine.  Biomechanical Limitations Clinical staff conducted group or individual video education with verbal and written material and guidebook.  Patient learns how biomechanical limitations can impact exercise and how we can mitigate and possibly overcome limitations to have an impactful and balanced exercise routine.  Body Composition Clinical staff conducted group or individual video education with verbal and written material and guidebook.  Patient learns that body composition (ratio of muscle mass to fat mass) is a key component to assessing overall fitness, rather than body weight alone. Increased fat mass, especially visceral belly fat, can put [place] at increased risk for metabolic syndrome,  type 2 diabetes, heart disease, and even death. It is recommended to combine diet and exercise (cardiovascular and resistance training) to improve your body composition. Seek guidance from your physician and exercise physiologist before implementing an exercise routine.  Exercise Action Plan Clinical staff conducted group or individual  video education with verbal and written material and guidebook.  Patient learns the recommended strategies to achieve and enjoy long-term exercise adherence, including variety, self-motivation, self-efficacy, and positive decision making. Benefits of exercise include fitness, good health, weight management, more energy, better sleep, less stress, and overall well-being.  Medical   Heart Disease Risk Reduction Clinical staff conducted group or individual video education with verbal and written material and guidebook.  Patient learns our heart is our most vital organ as it circulates oxygen, nutrients, white blood cells, and hormones throughout the entire body, and carries waste away. Data supports a plant-based eating plan like the Pritikin Program for its effectiveness in slowing progression of and reversing heart disease. The video provides a number of recommendations to address heart disease.   Metabolic Syndrome and Belly Fat  Clinical staff conducted group or individual video education with verbal and written material and guidebook.  Patient learns what metabolic syndrome is, how it leads to heart disease, and how one can reverse it and keep it from coming back. You have metabolic syndrome if you have 3 of the following 5 criteria: abdominal obesity, high blood pressure, high triglycerides, low HDL cholesterol, and high blood sugar.  Hypertension and Heart Disease Clinical staff conducted group or individual video education with verbal and written material and guidebook.  Patient learns that high blood pressure, or hypertension, is very common in the [place]. Hypertension is largely due to excessive salt intake, but other important risk factors include being overweight, physical inactivity, drinking too much alcohol, smoking, and not eating enough potassium from fruits and vegetables. High blood pressure is a leading risk factor for heart attack, stroke, congestive heart failure, dementia, kidney failure, and premature death. Long-term effects of excessive salt intake include stiffening of the arteries and thickening of heart muscle and organ damage. Recommendations include ways to reduce hypertension and the risk of heart disease.  Diseases of Our Time - Focusing on Diabetes Clinical staff conducted group or individual video education with verbal and written material and guidebook.  Patient learns why the best way to stop diseases of our time is prevention, through food and other lifestyle changes. Medicine (such as prescription pills and surgeries) is often only a Band-Aid on the problem, not a long-term solution. Most common diseases of our time include obesity, type 2 diabetes, hypertension, heart disease, and cancer. The Pritikin Program is recommended and has been proven to help reduce, reverse, and/or prevent the damaging effects of metabolic syndrome.  Nutrition   Overview of the Pritikin Eating Plan  Clinical staff conducted group or individual video education with verbal and written material and guidebook.  Patient learns about the Pritikin Eating Plan for disease risk reduction. The Pritikin Eating Plan emphasizes a wide variety of unrefined, minimally-processed carbohydrates, like fruits, vegetables, whole grains, and legumes. Go, Caution, and Stop food choices are explained. Plant-based and lean animal proteins are emphasized. Rationale provided for low sodium intake for blood pressure control, low added sugars for blood sugar stabilization, and low added fats and oils for coronary artery disease risk reduction and weight  management.  Calorie Density  Clinical staff conducted group or individual video education with verbal and written material and guidebook.  Patient learns about calorie density and how it impacts the Pritikin Eating Plan. Knowing the characteristics of the food you choose will help you decide whether those foods will lead to weight gain or weight loss, and whether you want to consume more or less of them.  Weight loss is usually a side effect of the Pritikin Eating Plan because of its focus on low calorie-dense foods.  Label Reading  Clinical staff conducted group or individual video education with verbal and written material and guidebook.  Patient learns about the Pritikin recommended label reading guidelines and corresponding recommendations regarding calorie density, added sugars, sodium content, and whole grains.  Dining Out - Part 1  Clinical staff conducted group or individual video education with verbal and written material and guidebook.  Patient learns that restaurant meals can be sabotaging because they can be so high in calories, fat, sodium, and/or sugar. Patient learns recommended strategies on how to positively address this and avoid unhealthy pitfalls.  Facts on Fats  Clinical staff conducted group or individual video education with verbal and written material and guidebook.  Patient learns that lifestyle modifications can be just as effective, if not more so, as many medications for lowering your risk of heart disease. A Pritikin lifestyle can help to reduce your risk of inflammation and atherosclerosis (cholesterol build-up, or plaque, in the artery walls). Lifestyle interventions such as dietary choices and physical activity address the cause of atherosclerosis. A review of the types of fats and their impact on blood cholesterol levels, along with dietary recommendations to reduce fat intake is also included.  Nutrition Action Plan  Clinical staff conducted group or individual  video education with verbal and written material and guidebook.  Patient learns how to incorporate Pritikin recommendations into their lifestyle. Recommendations include planning and keeping personal health goals in mind as an important part of their success.  Healthy Mind-Set    Healthy Minds, Bodies, Hearts  Clinical staff conducted group or individual video education with verbal and written material and guidebook.  Patient learns how to identify when they are stressed. Video will discuss the impact of that stress, as well as the many benefits of stress management. Patient will also be introduced to stress management techniques. The way we think, act, and feel has an impact on our hearts.  How Our Thoughts Can Heal Our Hearts  Clinical staff conducted group or individual video education with verbal and written material and guidebook.  Patient learns that negative thoughts can cause depression and anxiety. This can result in negative lifestyle behavior and serious health problems. Cognitive behavioral therapy is an effective method to help control our thoughts in order to change and improve our emotional outlook.  Additional Videos:  Exercise    Improving Performance  Clinical staff conducted group or individual video education with verbal and written material and guidebook.  Patient learns to use a non-linear approach by alternating intensity levels and lengths of time spent exercising to help burn more calories and lose more body fat. Cardiovascular exercise helps improve heart health, metabolism, hormonal balance, blood sugar control, and recovery from fatigue. Resistance training improves strength, endurance, balance, coordination, reaction time, metabolism, and muscle mass. Flexibility exercise improves circulation, posture, and balance. Seek guidance from your physician and exercise physiologist before implementing an exercise routine and learn your capabilities and proper form for all  exercise.  Introduction to Yoga  Clinical staff conducted group or individual video education with verbal and written material and guidebook.  Patient learns about yoga, a discipline of the coming together of mind, breath, and body. The benefits of yoga include improved flexibility, improved range of motion, better posture and core strength, increased lung function, weight loss, and positive self-image. Yoga's heart health benefits include lowered blood pressure, healthier heart rate,  decreased cholesterol and triglyceride levels, improved immune function, and reduced stress. Seek guidance from your physician and exercise physiologist before implementing an exercise routine and learn your capabilities and proper form for all exercise.  Medical   Aging: Enhancing Your Quality of Life  Clinical staff conducted group or individual video education with verbal and written material and guidebook.  Patient learns key strategies and recommendations to stay in good physical health and enhance quality of life, such as prevention strategies, having an advocate, securing a Health Care Proxy and Power of Attorney, and keeping a list of medications and system for tracking them. It also discusses how to avoid risk for bone loss.  Biology of Weight Control  Clinical staff conducted group or individual video education with verbal and written material and guidebook.  Patient learns that weight gain occurs because we consume more calories than we burn (eating more, moving less). Even if your body weight is normal, you may have higher ratios of fat compared to muscle mass. Too much body fat puts you at increased risk for cardiovascular disease, heart attack, stroke, type 2 diabetes, and obesity-related cancers. In addition to exercise, following the Pritikin Eating Plan can help reduce your risk.  Decoding Lab Results  Clinical staff conducted group or individual video education with verbal and written material and  guidebook.  Patient learns that lab test reflects one measurement whose values change over time and are influenced by many factors, including medication, stress, sleep, exercise, food, hydration, pre-existing medical conditions, and more. It is recommended to use the knowledge from this video to become more involved with your lab results and evaluate your numbers to speak with your doctor.   Diseases of Our Time - Overview  Clinical staff conducted group or individual video education with verbal and written material and guidebook.  Patient learns that according to the CDC, 50% to 70% of chronic diseases (such as obesity, type 2 diabetes, elevated lipids, hypertension, and heart disease) are avoidable through lifestyle improvements including healthier food choices, listening to satiety cues, and increased physical activity.  Sleep Disorders Clinical staff conducted group or individual video education with verbal and written material and guidebook.  Patient learns how good quality and duration of sleep are important to overall health and well-being. Patient also learns about sleep disorders and how they impact health along with recommendations to address them, including discussing with a physician.  Nutrition  Dining Out - Part 2 Clinical staff conducted group or individual video education with verbal and written material and guidebook.  Patient learns how to plan ahead and communicate in order to maximize their dining experience in a healthy and nutritious manner. Included are recommended food choices based on the type of restaurant the patient is visiting.   Fueling a Banker conducted group or individual video education with verbal and written material and guidebook.  There is a strong connection between our food choices and our health. Diseases like obesity and type 2 diabetes are very prevalent and are in large-part due to lifestyle choices. The Pritikin Eating Plan  provides plenty of food and hunger-curbing satisfaction. It is easy to follow, affordable, and helps reduce health risks.  Menu Workshop  Clinical staff conducted group or individual video education with verbal and written material and guidebook.  Patient learns that restaurant meals can sabotage health goals because they are often packed with calories, fat, sodium, and sugar. Recommendations include strategies to plan ahead and to communicate with the manager,  chef, or server to help order a healthier meal.  Planning Your Eating Strategy  Clinical staff conducted group or individual video education with verbal and written material and guidebook.  Patient learns about the Pritikin Eating Plan and its benefit of reducing the risk of disease. The Pritikin Eating Plan does not focus on calories. Instead, it emphasizes high-quality, nutrient-rich foods. By knowing the characteristics of the foods, we choose, we can determine their calorie density and make informed decisions.  Targeting Your Nutrition Priorities  Clinical staff conducted group or individual video education with verbal and written material and guidebook.  Patient learns that lifestyle habits have a tremendous impact on disease risk and progression. This video provides eating and physical activity recommendations based on your personal health goals, such as reducing LDL cholesterol, losing weight, preventing or controlling type 2 diabetes, and reducing high blood pressure.  Vitamins and Minerals  Clinical staff conducted group or individual video education with verbal and written material and guidebook.  Patient learns different ways to obtain key vitamins and minerals, including through a recommended healthy diet. It is important to discuss all supplements you take with your doctor.   Healthy Mind-Set    Smoking Cessation  Clinical staff conducted group or individual video education with verbal and written material and guidebook.   Patient learns that cigarette smoking and tobacco addiction pose a serious health risk which affects millions of people. Stopping smoking will significantly reduce the risk of heart disease, lung disease, and many forms of cancer. Recommended strategies for quitting are covered, including working with your doctor to develop a successful plan.  Culinary   Becoming a Set designer conducted group or individual video education with verbal and written material and guidebook.  Patient learns that cooking at home can be healthy, cost-effective, quick, and puts them in control. Keys to cooking healthy recipes will include looking at your recipe, assessing your equipment needs, planning ahead, making it simple, choosing cost-effective seasonal ingredients, and limiting the use of added fats, salts, and sugars.  Cooking - Breakfast and Snacks  Clinical staff conducted group or individual video education with verbal and written material and guidebook.  Patient learns how important breakfast is to satiety and nutrition through the entire day. Recommendations include key foods to eat during breakfast to help stabilize blood sugar levels and to prevent overeating at meals later in the day. Planning ahead is also a key component.  Cooking - Educational psychologist conducted group or individual video education with verbal and written material and guidebook.  Patient learns eating strategies to improve overall health, including an approach to cook more at home. Recommendations include thinking of animal protein as a side on your plate rather than center stage and focusing instead on lower calorie dense options like vegetables, fruits, whole grains, and plant-based proteins, such as beans. Making sauces in large quantities to freeze for later and leaving the skin on your vegetables are also recommended to maximize your experience.  Cooking - Healthy Salads and Dressing Clinical staff  conducted group or individual video education with verbal and written material and guidebook.  Patient learns that vegetables, fruits, whole grains, and legumes are the foundations of the Pritikin Eating Plan. Recommendations include how to incorporate each of these in flavorful and healthy salads, and how to create homemade salad dressings. Proper handling of ingredients is also covered. Cooking - Soups and Desserts  Cooking - Soups and Desserts Clinical staff conducted group or  individual video education with verbal and written material and guidebook.  Patient learns that Pritikin soups and desserts make for easy, nutritious, and delicious snacks and meal components that are low in sodium, fat, sugar, and calorie density, while high in vitamins, minerals, and filling fiber. Recommendations include simple and healthy ideas for soups and desserts.   Overview     The Pritikin Solution Program Overview Clinical staff conducted group or individual video education with verbal and written material and guidebook.  Patient learns that the results of the Pritikin Program have been documented in more than 100 articles published in peer-reviewed journals, and the benefits include reducing risk factors for (and, in some cases, even reversing) high cholesterol, high blood pressure, type 2 diabetes, obesity, and more! An overview of the three key pillars of the Pritikin Program will be covered: eating well, doing regular exercise, and having a healthy mind-set.  WORKSHOPS  Exercise: Exercise Basics: Building Your Action Plan Clinical staff led group instruction and group discussion with PowerPoint presentation and patient guidebook. To enhance the learning environment the use of posters, models and videos may be added. At the conclusion of this workshop, patients will comprehend the difference between physical activity and exercise, as well as the benefits of incorporating both, into their routine. Patients will  understand the FITT (Frequency, Intensity, Time, and Type) principle and how to use it to build an exercise action plan. In addition, safety concerns and other considerations for exercise and cardiac rehab will be addressed by the presenter. The purpose of this lesson is to promote a comprehensive and effective weekly exercise routine in order to improve patients' overall level of fitness.   Managing Heart Disease: Your Path to a Healthier Heart Clinical staff led group instruction and group discussion with PowerPoint presentation and patient guidebook. To enhance the learning environment the use of posters, models and videos may be added.At the conclusion of this workshop, patients will understand the anatomy and physiology of the heart. Additionally, they will understand how Pritikin's three pillars impact the risk factors, the progression, and the management of heart disease.  The purpose of this lesson is to provide a high-level overview of the heart, heart disease, and how the Pritikin lifestyle positively impacts risk factors.  Exercise Biomechanics Clinical staff led group instruction and group discussion with PowerPoint presentation and patient guidebook. To enhance the learning environment the use of posters, models and videos may be added. Patients will learn how the structural parts of their bodies function and how these functions impact their daily activities, movement, and exercise. Patients will learn how to promote a neutral spine, learn how to manage pain, and identify ways to improve their physical movement in order to promote healthy living. The purpose of this lesson is to expose patients to common physical limitations that impact physical activity. Participants will learn practical ways to adapt and manage aches and pains, and to minimize their effect on regular exercise. Patients will learn how to maintain good posture while sitting, walking, and lifting.  Balance Training  and Fall Prevention  Clinical staff led group instruction and group discussion with PowerPoint presentation and patient guidebook. To enhance the learning environment the use of posters, models and videos may be added. At the conclusion of this workshop, patients will understand the importance of their sensorimotor skills (vision, proprioception, and the vestibular system) in maintaining their ability to balance as they age. Patients will apply a variety of balancing exercises that are appropriate for their current level  of function. Patients will understand the common causes for poor balance, possible solutions to these problems, and ways to modify their physical environment in order to minimize their fall risk. The purpose of this lesson is to teach patients about the importance of maintaining balance as they age and ways to minimize their risk of falling.  WORKSHOPS   Nutrition:  Fueling a Ship broker led group instruction and group discussion with PowerPoint presentation and patient guidebook. To enhance the learning environment the use of posters, models and videos may be added. Patients will review the foundational principles of the Pritikin Eating Plan and understand what constitutes a serving size in each of the food groups. Patients will also learn Pritikin-friendly foods that are better choices when away from home and review make-ahead meal and snack options. Calorie density will be reviewed and applied to three nutrition priorities: weight maintenance, weight loss, and weight gain. The purpose of this lesson is to reinforce (in a group setting) the key concepts around what patients are recommended to eat and how to apply these guidelines when away from home by planning and selecting Pritikin-friendly options. Patients will understand how calorie density may be adjusted for different weight management goals.  Mindful Eating  Clinical staff led group instruction and group  discussion with PowerPoint presentation and patient guidebook. To enhance the learning environment the use of posters, models and videos may be added. Patients will briefly review the concepts of the Pritikin Eating Plan and the importance of low-calorie dense foods. The concept of mindful eating will be introduced as well as the importance of paying attention to internal hunger signals. Triggers for non-hunger eating and techniques for dealing with triggers will be explored. The purpose of this lesson is to provide patients with the opportunity to review the basic principles of the Pritikin Eating Plan, discuss the value of eating mindfully and how to measure internal cues of hunger and fullness using the Hunger Scale. Patients will also discuss reasons for non-hunger eating and learn strategies to use for controlling emotional eating.  Targeting Your Nutrition Priorities Clinical staff led group instruction and group discussion with PowerPoint presentation and patient guidebook. To enhance the learning environment the use of posters, models and videos may be added. Patients will learn how to determine their genetic susceptibility to disease by reviewing their family history. Patients will gain insight into the importance of diet as part of an overall healthy lifestyle in mitigating the impact of genetics and other environmental insults. The purpose of this lesson is to provide patients with the opportunity to assess their personal nutrition priorities by looking at their family history, their own health history and current risk factors. Patients will also be able to discuss ways of prioritizing and modifying the Pritikin Eating Plan for their highest risk areas  Menu  Clinical staff led group instruction and group discussion with PowerPoint presentation and patient guidebook. To enhance the learning environment the use of posters, models and videos may be added. Using menus brought in from [employer],  or printed from [employer], patients will apply the Pritikin dining out guidelines that were presented in the [employer] video. Patients will also be able to practice these guidelines in a variety of provided scenarios. The purpose of this lesson is to provide patients with the opportunity to practice hands-on learning of the Pritikin Dining Out guidelines with actual menus and practice scenarios.  Label Reading Clinical staff led group instruction and group discussion  with PowerPoint presentation and patient guidebook. To enhance the learning environment the use of posters, models and videos may be added. Patients will review and discuss the Pritikin label reading guidelines presented in Pritikin's Label Reading Educational series video. Using fool labels brought in from local grocery stores and markets, patients will apply the label reading guidelines and determine if the packaged food meet the Pritikin guidelines. The purpose of this lesson is to provide patients with the opportunity to review, discuss, and practice hands-on learning of the Pritikin Label Reading guidelines with actual packaged food labels. Cooking School  Pritikin's [employer] are designed to teach patients ways to prepare quick, simple, and affordable recipes at home. The importance of nutrition's role in chronic disease risk reduction is reflected in its emphasis in the overall Pritikin program. By learning how to prepare essential core Pritikin Eating Plan recipes, patients will increase control over what they eat; be able to customize the flavor of foods without the use of added salt, sugar, or fat; and improve the quality of the food they consume. By learning a set of core recipes which are easily assembled, quickly prepared, and affordable, patients are more likely to prepare more healthy foods at home. These workshops focus on convenient breakfasts, simple entres, side dishes, and desserts which  can be prepared with minimal effort and are consistent with nutrition recommendations for cardiovascular risk reduction. Cooking [employer] are taught by a Armed forces logistics/support/administrative officer (RD) who has been trained by the [employer]. The chef or RD has a clear understanding of the importance of minimizing - if not completely eliminating - added fat, sugar, and sodium in recipes. Throughout the series of Cooking School Workshop sessions, patients will learn about healthy ingredients and efficient methods of cooking to build confidence in their capability to prepare    Cooking School weekly topics:  Adding Flavor- Sodium-Free  Fast and Healthy Breakfasts  Powerhouse Plant-Based Proteins  Satisfying Salads and Dressings  Simple Sides and Sauces  International Cuisine-Spotlight on the [employer] Zones  Delicious Desserts  Savory Soups  [employer] - Meals in a Astronomer Appetizers and Snacks  Comforting Weekend Breakfasts  One-Pot Wonders   Fast Evening Meals  Landscape architect Your [employer] Plate  WORKSHOPS   Healthy Mindset (Psychosocial):  Focused Goals, Sustainable Changes Clinical staff led group instruction and group discussion with PowerPoint presentation and patient guidebook. To enhance the learning environment the use of posters, models and videos may be added. Patients will be able to apply effective goal setting strategies to establish at least one personal goal, and then take consistent, meaningful action toward that goal. They will learn to identify common barriers to achieving personal goals and develop strategies to overcome them. Patients will also gain an understanding of how our mind-set can impact our ability to achieve goals and the importance of cultivating a positive and growth-oriented mind-set. The purpose of this lesson is to provide patients with a deeper understanding of how to set and achieve personal goals, as well as the tools  and strategies needed to overcome common obstacles which may arise along the way.  From Head to Heart: The Power of a Healthy Outlook  Clinical staff led group instruction and group discussion with PowerPoint presentation and patient guidebook. To enhance the learning environment the use of posters, models and videos may be added. Patients will be able to recognize and describe the impact of emotions and mood on physical health.  They will discover the importance of self-care and explore self-care practices which may work for them. Patients will also learn how to utilize the 4 C's to cultivate a healthier outlook and better manage stress and challenges. The purpose of this lesson is to demonstrate to patients how a healthy outlook is an essential part of maintaining good health, especially as they continue their cardiac rehab journey.  Healthy Sleep for a Healthy Heart Clinical staff led group instruction and group discussion with PowerPoint presentation and patient guidebook. To enhance the learning environment the use of posters, models and videos may be added. At the conclusion of this workshop, patients will be able to demonstrate knowledge of the importance of sleep to overall health, well-being, and quality of life. They will understand the symptoms of, and treatments for, common sleep disorders. Patients will also be able to identify daytime and nighttime behaviors which impact sleep, and they will be able to apply these tools to help manage sleep-related challenges. The purpose of this lesson is to provide patients with a general overview of sleep and outline the importance of quality sleep. Patients will learn about a few of the most common sleep disorders. Patients will also be introduced to the concept of "sleep hygiene," and discover ways to self-manage certain sleeping problems through simple daily behavior changes. Finally, the workshop will motivate patients by clarifying the links between  quality sleep and their goals of heart-healthy living.   Recognizing and Reducing Stress Clinical staff led group instruction and group discussion with PowerPoint presentation and patient guidebook. To enhance the learning environment the use of posters, models and videos may be added. At the conclusion of this workshop, patients will be able to understand the types of stress reactions, differentiate between acute and chronic stress, and recognize the impact that chronic stress has on their health. They will also be able to apply different coping mechanisms, such as reframing negative self-talk. Patients will have the opportunity to practice a variety of stress management techniques, such as deep abdominal breathing, progressive muscle relaxation, and/or guided imagery.  The purpose of this lesson is to educate patients on the role of stress in their lives and to provide healthy techniques for coping with it.  Learning Barriers/Preferences:  Learning Barriers/Preferences - 08/11/22 1118       Learning Barriers/Preferences   Learning Barriers Sight    Learning Preferences Verbal Instruction;Video;Skilled Demonstration;Pictoral;Individual Instruction;Group Instruction;Computer/Internet             Education Topics:  Knowledge Questionnaire Score:  Knowledge Questionnaire Score - 08/11/22 1341       Knowledge Questionnaire Score   Pre Score 19/24             Core Components/Risk Factors/Patient Goals at Admission:  Personal Goals and Risk Factors at Admission - 08/11/22 1116       Core Components/Risk Factors/Patient Goals on Admission   Heart Failure Yes    Intervention Provide a combined exercise and nutrition program that is supplemented with education, support and counseling about heart failure. Directed toward relieving symptoms such as shortness of breath, decreased exercise tolerance, and extremity edema.    Expected Outcomes Improve functional capacity of life;Short  term: Attendance in program 2-3 days a week with increased exercise capacity. Reported lower sodium intake. Reported increased fruit and vegetable intake. Reports medication compliance.;Short term: Daily weights obtained and reported for increase. Utilizing diuretic protocols set by physician.;Long term: Adoption of self-care skills and reduction of barriers for early signs and symptoms  recognition and intervention leading to self-care maintenance.    Hypertension Yes    Intervention Provide education on lifestyle modifcations including regular physical activity/exercise, weight management, moderate sodium restriction and increased consumption of fresh fruit, vegetables, and low fat dairy, alcohol moderation, and smoking cessation.;Monitor prescription use compliance.    Expected Outcomes Short Term: Continued assessment and intervention until BP is < 140/26mm HG in hypertensive participants. < 130/20mm HG in hypertensive participants with diabetes, heart failure or chronic kidney disease.;Long Term: Maintenance of blood pressure at goal levels.    Lipids Yes    Intervention Provide education and support for participant on nutrition & aerobic/resistive exercise along with prescribed medications to achieve LDL 70mg , HDL >40mg .    Expected Outcomes Short Term: Participant states understanding of desired cholesterol values and is compliant with medications prescribed. Participant is following exercise prescription and nutrition guidelines.;Long Term: Cholesterol controlled with medications as prescribed, with individualized exercise RX and with personalized nutrition plan. Value goals: LDL < 70mg , HDL > 40 mg.             Core Components/Risk Factors/Patient Goals Review:   Goals and Risk Factor Review     Row Name 08/20/22 1604 08/25/22 1641           Core Components/Risk Factors/Patient Goals Review   Personal Goals Review Weight Management/Obesity;Lipids;Heart Failure;Hypertension Weight  Management/Obesity;Lipids;Heart Failure;Hypertension      Review Steven Brady started intensive cardiac rehab on 08/19/22. Vital signs were stable. Steven Brady did well with exercise. Steven Brady started intensive cardiac rehab on 08/19/22. and is off to a good start to  exercise.      Expected Outcomes Steven Brady will continue to participate in intensive cardiac rehab for exercise, nutrition and lifestyle modifications Steven Brady will continue to participate in intensive cardiac rehab for exercise, nutrition and lifestyle modifications               Core Components/Risk Factors/Patient Goals at Discharge (Final Review):   Goals and Risk Factor Review - 08/25/22 1641       Core Components/Risk Factors/Patient Goals Review   Personal Goals Review Weight Management/Obesity;Lipids;Heart Failure;Hypertension    Review Steven Brady started intensive cardiac rehab on 08/19/22. and is off to a good start to  exercise.    Expected Outcomes Steven Brady will continue to participate in intensive cardiac rehab for exercise, nutrition and lifestyle modifications             ITP Comments:  ITP Comments     Row Name 08/11/22 1610 08/20/22 1555 08/25/22 1638       ITP Comments Armanda Magic, MD: Medical Director.  Introduction to the [facility] Education Program/Intensive Cardiac Rehab.  Initial orientation packet reviewed with the patient. 30 Day ITP Review. Steven Brady started intensive cardiac rehab on 08/19/22 and did well with execise for his fitness level 30 Day ITP Review. Steven Brady started intensive cardiac rehab on 08/19/22 and is off to a good start to exercise            Comments: See ITP comments.

## 2022-08-26 ENCOUNTER — Encounter (HOSPITAL_COMMUNITY): Payer: Medicare Other

## 2022-08-28 ENCOUNTER — Encounter (HOSPITAL_COMMUNITY): Payer: Medicare Other

## 2022-08-31 ENCOUNTER — Encounter (HOSPITAL_COMMUNITY): Payer: Medicare Other

## 2022-08-31 ENCOUNTER — Encounter (HOSPITAL_COMMUNITY)
Admission: RE | Admit: 2022-08-31 | Discharge: 2022-08-31 | Disposition: A | Discharge status: Home / Self Care | Payer: Medicare Other | Source: Ambulatory Visit | Attending: Cardiology | Admitting: Cardiology

## 2022-08-31 DIAGNOSIS — Z955 Presence of coronary angioplasty implant and graft: Secondary | ICD-10-CM | POA: Diagnosis present

## 2022-08-31 NOTE — Progress Notes (Signed)
Trey Paula returned to exercise at cardiac rehab today and exercised without difficulty.Thayer Headings RN BSN

## 2022-09-02 ENCOUNTER — Encounter (HOSPITAL_COMMUNITY)
Admission: RE | Admit: 2022-09-02 | Discharge: 2022-09-02 | Disposition: A | Discharge status: Home / Self Care | Payer: Medicare Other | Source: Ambulatory Visit | Attending: Cardiology | Admitting: Cardiology

## 2022-09-02 DIAGNOSIS — Z955 Presence of coronary angioplasty implant and graft: Secondary | ICD-10-CM | POA: Diagnosis not present

## 2022-09-04 ENCOUNTER — Encounter (HOSPITAL_COMMUNITY): Payer: Medicare Other

## 2022-09-07 ENCOUNTER — Encounter (HOSPITAL_COMMUNITY): Payer: Medicare Other

## 2022-09-09 ENCOUNTER — Encounter (HOSPITAL_COMMUNITY): Payer: Medicare Other

## 2022-09-11 ENCOUNTER — Encounter (HOSPITAL_COMMUNITY): Payer: Medicare Other

## 2022-09-14 ENCOUNTER — Encounter (HOSPITAL_COMMUNITY): Payer: Medicare Other

## 2022-09-15 ENCOUNTER — Telehealth (HOSPITAL_COMMUNITY): Payer: Self-pay

## 2022-09-16 ENCOUNTER — Encounter (HOSPITAL_COMMUNITY): Payer: Medicare Other

## 2022-09-18 ENCOUNTER — Encounter (HOSPITAL_COMMUNITY): Payer: Medicare Other

## 2022-09-21 ENCOUNTER — Encounter (HOSPITAL_COMMUNITY)
Admission: RE | Admit: 2022-09-21 | Discharge: 2022-09-21 | Disposition: A | Discharge status: Home / Self Care | Payer: 59 | Source: Ambulatory Visit | Attending: Cardiology | Admitting: Cardiology

## 2022-09-21 ENCOUNTER — Encounter (HOSPITAL_COMMUNITY): Payer: 59

## 2022-09-21 DIAGNOSIS — I447 Left bundle-branch block, unspecified: Secondary | ICD-10-CM | POA: Diagnosis not present

## 2022-09-21 DIAGNOSIS — I5022 Chronic systolic (congestive) heart failure: Secondary | ICD-10-CM | POA: Diagnosis present

## 2022-09-21 DIAGNOSIS — Z8673 Personal history of transient ischemic attack (TIA), and cerebral infarction without residual deficits: Secondary | ICD-10-CM | POA: Insufficient documentation

## 2022-09-21 DIAGNOSIS — Z955 Presence of coronary angioplasty implant and graft: Secondary | ICD-10-CM | POA: Diagnosis present

## 2022-09-21 DIAGNOSIS — I2510 Atherosclerotic heart disease of native coronary artery without angina pectoris: Secondary | ICD-10-CM | POA: Diagnosis not present

## 2022-09-21 LAB — GLUCOSE, CAPILLARY: Glucose-Capillary: 107 mg/dL — ABNORMAL HIGH (ref 70–99)

## 2022-09-23 ENCOUNTER — Encounter (HOSPITAL_COMMUNITY)
Admission: RE | Admit: 2022-09-23 | Discharge: 2022-09-23 | Disposition: A | Discharge status: Home / Self Care | Payer: 59 | Source: Ambulatory Visit | Attending: Cardiology | Admitting: Cardiology

## 2022-09-23 DIAGNOSIS — Z955 Presence of coronary angioplasty implant and graft: Secondary | ICD-10-CM

## 2022-09-23 NOTE — Progress Notes (Signed)
Cardiac Individual Treatment Plan  Patient Details  Name: Steven Brady MRN: [id number] Date of Birth: 17-Oct-1955 Referring Provider:   Flowsheet Row INTENSIVE CARDIAC REHAB ORIENT from 08/11/2022 in [facility], [facility], [facility]  Referring Provider Dr. Cherly Anderson (Armanda Magic, MD covering)       Initial Encounter Date:  Flowsheet Row INTENSIVE CARDIAC REHAB ORIENT from 08/11/2022 in [facility]ascular, [facility] Lung [facility]  Date 08/11/22       Visit Diagnosis: 07/13/22 S/P coronary artery stent placement RCA x 3  Patient's Home Medications on Admission:  Current Outpatient Medications:    Accu-Chek Softclix Lancets lancets, 1 each by Other route in the morning and at bedtime., Disp: , Rfl:    glucose blood (ACCU-CHEK GUIDE) test strip, 1 each by Other route as needed for other., Disp: , Rfl:    metFORMIN (GLUCOPHAGE) 500 MG tablet, Take 500 mg by mouth 2 (two) times daily with a meal., Disp: , Rfl:    apixaban (ELIQUIS) 5 MG TABS tablet, Take 5 mg by mouth 2 (two) times daily., Disp: , Rfl:    atorvastatin (LIPITOR) 40 MG tablet, Take 40 mg by mouth daily., Disp: , Rfl:    carvedilol (COREG) 12.5 MG tablet, Take 1 tablet (12.5 mg total) by mouth 2 (two) times daily., Disp: 60 tablet, Rfl: 1   Cholecalciferol (VITAMIN D3) 2000 UNITS TABS, Take 2,000 Units by mouth daily., Disp: 30 tablet, Rfl: 11   clopidogrel (PLAVIX) 75 MG tablet, Take 75 mg by mouth daily., Disp: , Rfl:    dapagliflozin propanediol (FARXIGA) 10 MG TABS tablet, Take 10 mg by mouth daily., Disp: , Rfl:    furosemide (LASIX) 40 MG tablet, Take 40 mg by mouth daily as needed for fluid or edema., Disp: , Rfl:    hydrALAZINE (APRESOLINE) 25 MG tablet, Take 1 tablet (25 mg total) by mouth every 8 (eight) hours. (Patient taking differently: Take 50 mg by mouth every 8 (eight) hours.), Disp: 90 tablet, Rfl: 1   meclizine (ANTIVERT) 25 MG tablet, Take 1  tablet (25 mg total) by mouth 2 (two) times daily., Disp: 60 tablet, Rfl: 1   Multiple Vitamins-Minerals (CENTRUM ADULTS PO), Take 1 tablet by mouth daily., Disp: , Rfl:    potassium chloride SA (KLOR-CON M) 20 MEQ tablet, Take 20 mEq by mouth daily. Takes 2 tablets for a total of 40 meq, Disp: , Rfl:    sacubitril-valsartan (ENTRESTO) 97-103 MG, Take 1 tablet by mouth 2 (two) times daily., Disp: , Rfl:    spironolactone (ALDACTONE) 25 MG tablet, Take 25 mg by mouth daily., Disp: , Rfl:    UNABLE TO FIND, Outpatient vestibular rehabilitation. 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   recently hurt left ankle - 2 weeks ago   CHF (congestive heart failure) (HCC) 2015   Hematuria - cause not known 06/08/11   pt has been seeing blood off and on in urine   Hyperlipidemia Dx 2015   Hypertension Dx 2015   Tobacco abuse    since age of 80    Tobacco Use: Social History   Tobacco Use  Smoking Status Light Smoker   Packs/day: .25   Types: Cigarettes  Smokeless Tobacco Never    Labs: Review Flowsheet       Latest Ref Rng & Units 08/25/2009 08/26/2009 06/09/2011  Labs for  ITP Cardiac and Pulmonary Rehab  Cholestrol 0 - 200 mg/dL - 161        ATP III CLASSIFICATION:  <200     mg/dL   Desirable  096-045  mg/dL   Borderline High  >=409    mg/dL   High         811   LDL (calc) 0 - 99 mg/dL - 914        Total Cholesterol/HDL:CHD Risk Coronary Heart Disease Risk Table                     Men   Women  1/2 Average Risk   3.4   3.3  Average Risk       5.0   4.4  2 X Average Risk   9.6   7.1  3 X Average Risk  23.4   11.0        Use the calculated Patient Ratio above and the CHD Risk Table to determine the patient's CHD Risk.        ATP III CLASSIFICATION (LDL):  <100     mg/dL   Optimal  782-956  mg/dL   Near or Above                    Optimal  130-159  mg/dL   Borderline  213-086   mg/dL   High  >578     mg/dL   Very High  469   HDL-C >39 mg/dL - 46  43   Trlycerides <150 mg/dL - 68  48   Hemoglobin G2X <5.7 % 6.0 (NOTE)                                                                       According to the ADA Clinical Practice Recommendations for 2011, when HbA1c is used as a screening test:   >=6.5%   Diagnostic of Diabetes Mellitus           (if abnormal result  is confirmed)  5.7-6.4%   Increased risk of developing Diabetes Mellitus  References:Diagnosis and Classification of Diabetes Mellitus,Diabetes Care,2011,34(Suppl 1):S62-S69 and Standards of Medical Care in         Diabetes - 2011,Diabetes BMWU,1324,40  (Suppl 1):S11-S61.  - -    Capillary Blood Glucose: Lab Results  Component Value Date   GLUCAP 107 (H) 09/21/2022   GLUCAP 177 (H) 08/27/2009   GLUCAP 114 (H) 08/27/2009     Exercise Target Goals: Exercise Program Goal: Individual exercise prescription set using results from initial 6 min walk test and THRR while considering  patient's activity barriers and safety.   Exercise Prescription Goal: Initial exercise prescription builds to 30-45 minutes a day of aerobic activity, 2-3 days per week.  Home exercise guidelines will be given to patient during program as part of exercise prescription that the participant will acknowledge.  Activity Barriers & Risk Stratification:  Activity Barriers & Cardiac Risk Stratification - 08/11/22 1112       Activity Barriers & Cardiac Risk Stratification   Activity Barriers Arthritis;Neck/Spine Problems;Back Problems;Joint Problems;Balance Concerns;Deconditioning;Assistive Device;Other (comment)    Comments S/P CVA with rt side residual deficits, Pt with h/o vertigo    Cardiac Risk Stratification High  6 Minute Walk:  6 Minute Walk     Row Name 08/11/22 1110         6 Minute Walk   Phase Initial  Pt used wheeled walker instead of his cane     Distance 944 feet  Pt used wheeled walker      Walk Time 6 minutes     # of Rest Breaks 0     MPH 1.8     METS 2.6     RPE 7     Perceived Dyspnea  0     VO2 Peak 8.95     Symptoms No     Resting HR 78 bpm     Resting BP 100/72     Resting Oxygen Saturation  98 %     Exercise Oxygen Saturation  during 6 min walk 97 %     Max Ex. HR 114 bpm     Max Ex. BP 140/82     2 Minute Post BP 124/70              Oxygen Initial Assessment:   Oxygen Re-Evaluation:   Oxygen Discharge (Final Oxygen Re-Evaluation):   Initial Exercise Prescription:  Initial Exercise Prescription - 08/11/22 1300       Date of Initial Exercise RX and Referring Provider   Date 08/11/22    Referring Provider Dr. Cherly Anderson Armanda Magic, MD covering)    Expected Discharge Date 10/23/22      NuStep   Level 1    SPM 80    Minutes 15    METs 2.6      Arm Ergometer   Level 1    Watts 25    RPM 60    Minutes 15    METs 2.6      Prescription Details   Frequency (times per week) 3    Duration Progress to 30 minutes of continuous aerobic without signs/symptoms of physical distress      Intensity   THRR 40-80% of Max Heartrate 62-124    Ratings of Perceived Exertion 11-13    Perceived Dyspnea 0-4      Progression   Progression Continue progressive overload as per policy without signs/symptoms or physical distress.      Resistance Training   Training Prescription Yes    Weight 3 lbs    Reps 10-15             Perform Capillary Blood Glucose checks as needed.  Exercise Prescription Changes:   Exercise Prescription Changes     Row Name 08/19/22 1400 09/02/22 1500 09/23/22 1400         Response to Exercise   Blood Pressure (Admit) 106/70 94/56 110/72     Blood Pressure (Exercise) 152/84 114/62 140/90     Blood Pressure (Exit) 112/60 96/62 100/60     Heart Rate (Admit) 81 bpm 76 bpm 72 bpm     Heart Rate (Exercise) 118 bpm 97 bpm 98 bpm     Heart Rate (Exit) 89 bpm 85 bpm 71 bpm     Rating of Perceived Exertion (Exercise) 10  11 10      Symptoms None None None     Comments Pt's first day in the CRP2 program Reviewed METs Reviewed METs and goals     Duration Continue with 30 min of aerobic exercise without signs/symptoms of physical distress. Continue with 30 min of aerobic exercise without signs/symptoms of physical distress. Continue with 30 min of aerobic exercise without signs/symptoms  of physical distress.     Intensity THRR unchanged THRR unchanged THRR unchanged       Progression   Progression Continue to progress workloads to maintain intensity without signs/symptoms of physical distress. Continue to progress workloads to maintain intensity without signs/symptoms of physical distress. Continue to progress workloads to maintain intensity without signs/symptoms of physical distress.     Average METs 2.3 2.2 2.1       Resistance Training   Training Prescription No No No     Weight No weights on wednesday No weights on wednesday No weights on wednesday       Interval Training   Interval Training No -- --       NuStep   Level 1 1 2      SPM 87 93 --     Minutes 15 15 15      METs 2.3 2.2 2.2       Arm Ergometer   Level 1 2.5 2.5     Watts -- 22 18     RPM -- 51 46     Minutes 15 15 15      METs -- 2.2 2              Exercise Comments:   Exercise Comments     Row Name 08/19/22 1419 09/02/22 1500 09/23/22 1414       Exercise Comments Pt's first day in the CRP2 program. Pt exercise with no complaints. Off to a good start. Reviewed METs. METs at plateau becacuase pt was out last week week due to shoulder issue. Will consider increasing workload on Nustep next session. Reviewed METs and goals. Pt feeling less energetic today and METs are decreased. Encouraged patient to do the best he can after his recent move. Pt voices now living on the 2nd floor so has to do more stairs.              Exercise Goals and Review:   Exercise Goals     Row Name 08/11/22 1113             Exercise Goals    Increase Physical Activity Yes       Intervention Provide advice, education, support and counseling about physical activity/exercise needs.;Develop an individualized exercise prescription for aerobic and resistive training based on initial evaluation findings, risk stratification, comorbidities and participant's personal goals.       Expected Outcomes Short Term: Attend rehab on a regular basis to increase amount of physical activity.;Long Term: Add in home exercise to make exercise part of routine and to increase amount of physical activity.;Long Term: Exercising regularly at least 3-5 days a week.       Increase Strength and Stamina Yes       Intervention Provide advice, education, support and counseling about physical activity/exercise needs.;Develop an individualized exercise prescription for aerobic and resistive training based on initial evaluation findings, risk stratification, comorbidities and participant's personal goals.       Expected Outcomes Short Term: Increase workloads from initial exercise prescription for resistance, speed, and METs.;Short Term: Perform resistance training exercises routinely during rehab and add in resistance training at home;Long Term: Improve cardiorespiratory fitness, muscular endurance and strength as measured by increased METs and functional capacity ( )       Able to understand and use rate of perceived exertion (RPE) scale Yes       Intervention Provide education and explanation on how to use RPE scale       Expected Outcomes  Short Term: Able to use RPE daily in rehab to express subjective intensity level;Long Term:  Able to use RPE to guide intensity level when exercising independently       Knowledge and understanding of Target Heart Rate Range (THRR) Yes       Intervention Provide education and explanation of THRR including how the numbers were predicted and where they are located for reference       Expected Outcomes Short Term: Able to state/look up  THRR;Long Term: Able to use THRR to govern intensity when exercising independently;Short Term: Able to use daily as guideline for intensity in rehab       Understanding of Exercise Prescription Yes       Intervention Provide education, explanation, and written materials on patient's individual exercise prescription       Expected Outcomes Short Term: Able to explain program exercise prescription;Long Term: Able to explain home exercise prescription to exercise independently                Exercise Goals Re-Evaluation :  Exercise Goals Re-Evaluation     Row Name 08/19/22 1417 09/23/22 1413           Exercise Goal Re-Evaluation   Exercise Goals Review Increase Physical Activity;Increase Strength and Stamina;Able to understand and use rate of perceived exertion (RPE) scale;Knowledge and understanding of Target Heart Rate Range (THRR);Understanding of Exercise Prescription Increase Physical Activity;Increase Strength and Stamina;Able to understand and use rate of perceived exertion (RPE) scale;Knowledge and understanding of Target Heart Rate Range (THRR);Understanding of Exercise Prescription      Comments Pt's first day in the CRP2 program. Pt understands the RPE scale, Exercise Rx, and THRR. Reviewed METs and goals. Peak METs to date are 2.7. Pt feels that that there has been some improvement in strength and stamina but it has not increased as much as he would like.      Expected Outcomes Will continue to monitor patient and progress exercise workloads as tolerated. Will continue to monitor patient and progress exercise workloads as tolerated.               Discharge Exercise Prescription (Final Exercise Prescription Changes):  Exercise Prescription Changes - 09/23/22 1400       Response to Exercise   Blood Pressure (Admit) 110/72    Blood Pressure (Exercise) 140/90    Blood Pressure (Exit) 100/60    Heart Rate (Admit) 72 bpm    Heart Rate (Exercise) 98 bpm    Heart Rate (Exit)  71 bpm    Rating of Perceived Exertion (Exercise) 10    Symptoms None    Comments Reviewed METs and goals    Duration Continue with 30 min of aerobic exercise without signs/symptoms of physical distress.    Intensity THRR unchanged      Progression   Progression Continue to progress workloads to maintain intensity without signs/symptoms of physical distress.    Average METs 2.1      Resistance Training   Training Prescription No    Weight No weights on wednesday      NuStep   Level 2    Minutes 15    METs 2.2      Arm Ergometer   Level 2.5    Watts 18    RPM 46    Minutes 15    METs 2             Nutrition:  Target Goals: Understanding of nutrition guidelines, daily intake of sodium <  1500mg , cholesterol 200mg , calories 30% from fat and 7% or less from saturated fats, daily to have 5 or more servings of fruits and vegetables.  Biometrics:  Pre Biometrics - 08/11/22 0950       Pre Biometrics   Waist Circumference 44 inches    Hip Circumference 46 inches    Waist to Hip Ratio 0.96 %    Triceps Skinfold 10 mm    % Body Fat 29 %    Grip Strength 36 kg    Flexibility --   Not performed due to back problems   Single Leg Stand 2.35 seconds   High risk for fall             Nutrition Therapy Plan and Nutrition Goals:  Nutrition Therapy & Goals - 09/17/22 1546       Nutrition Therapy   Diet Heart healthy diet    Drug/Food Interactions Statins/Certain Fruits      Personal Nutrition Goals   Nutrition Goal Patient to identify strategies for reducing cardiovascular risk by attending the [employer] education and nutrition series weekly.    Personal Goal #2 Patient to improve diet quality by using the plate method as a guide for meal planning to include lean protein/plant protein, fruits, vegetables, whole grains, nonfat dairy as part of a well-balanced diet.    Personal Goal #3 Patient to reduce sodium to 1500mg  per day    Comments Patient's attendance has been  variable. He has not attended intensive cardiac rehab since 6/12 and only attended 4 session. Will follow-up on goals and lifestyle changes upon patient's return. Patient will benefit from participation in intensive cardiac rehab for nutrition, exercise, and lifestyle modification.      Intervention Plan   Intervention Prescribe, educate and counsel regarding individualized specific dietary modifications aiming towards targeted core components such as weight, hypertension, lipid management, diabetes, heart failure and other comorbidities.;Nutrition handout(s) given to patient.    Expected Outcomes Short Term Goal: Understand basic principles of dietary content, such as calories, fat, sodium, cholesterol and nutrients.;Long Term Goal: Adherence to prescribed nutrition plan.             Nutrition Assessments:  Nutrition Assessments - 08/21/22 1414       Rate Your Plate Scores   Pre Score 58            MEDIFICTS Score Key: ?70 Need to make dietary changes  40-70 Heart Healthy Diet ? 40 Therapeutic Level Cholesterol Diet   Flowsheet Row INTENSIVE CARDIAC REHAB from 08/21/2022 in [facility], Vascular, [facility] Lung [facility]  Picture Your Plate Total Score on Admission 58      Picture Your Plate Scores: <78 Unhealthy dietary pattern with much room for improvement. 41-50 Dietary pattern unlikely to meet recommendations for good health and room for improvement. 51-60 More healthful dietary pattern, with some room for improvement.  >60 Healthy dietary pattern, although there may be some specific behaviors that could be improved.    Nutrition Goals Re-Evaluation:  Nutrition Goals Re-Evaluation     Row Name 08/21/22 1409 09/17/22 1546           Goals   Current Weight 205 lb 11 oz (93.3 kg) 206 lb 12.7 oz (93.8 kg)      Comment Cr 1.37, GFR 57, A1c 7.5; other most recent labs Cr 1.37, GFR 57,      Expected Outcome Patient will benefit from  participation in intensive cardiac rehab for nutrition, exercise, and  lifestyle modification. Patient's attendance has been variable. He has not attended intensive cardiac rehab since 6/12 and only attended 4 session. Will follow-up on goals and lifestyle changes upon patient's return. Patient will benefit from participation in intensive cardiac rehab for nutrition, exercise, and lifestyle modification.               Nutrition Goals Re-Evaluation:  Nutrition Goals Re-Evaluation     Row Name 08/21/22 1409 09/17/22 1546           Goals   Current Weight 205 lb 11 oz (93.3 kg) 206 lb 12.7 oz (93.8 kg)      Comment Cr 1.37, GFR 57, A1c 7.5; other most recent labs Cr 1.37, GFR 57,      Expected Outcome Patient will benefit from participation in intensive cardiac rehab for nutrition, exercise, and lifestyle modification. Patient's attendance has been variable. He has not attended intensive cardiac rehab since 6/12 and only attended 4 session. Will follow-up on goals and lifestyle changes upon patient's return. Patient will benefit from participation in intensive cardiac rehab for nutrition, exercise, and lifestyle modification.               Nutrition Goals Discharge (Final Nutrition Goals Re-Evaluation):  Nutrition Goals Re-Evaluation - 09/17/22 1546       Goals   Current Weight 206 lb 12.7 oz (93.8 kg)    Comment A1c 7.5; other most recent labs Cr 1.37, GFR 57,    Expected Outcome Patient's attendance has been variable. He has not attended intensive cardiac rehab since 6/12 and only attended 4 session. Will follow-up on goals and lifestyle changes upon patient's return. Patient will benefit from participation in intensive cardiac rehab for nutrition, exercise, and lifestyle modification.             Psychosocial: Target Goals: Acknowledge presence or absence of significant depression and/or stress, maximize coping skills, provide positive support system. Participant is able to  verbalize types and ability to use techniques and skills needed for reducing stress and depression.  Initial Review & Psychosocial Screening:  Initial Psych Review & Screening - 08/11/22 1114       Initial Review   Current issues with Current Depression;Current Sleep Concerns   Pt declines counseling at this time     Family Dynamics   Good Support System? Yes   Pt has daughter and son, girlfriend, and friend Bernette Redbird and his wife     Barriers   Psychosocial barriers to participate in program The patient should benefit from training in stress management and relaxation.      Screening Interventions   Interventions Encouraged to exercise    Expected Outcomes Short Term goal: Utilizing psychosocial counselor, staff and physician to assist with identification of specific Stressors or current issues interfering with healing process. Setting desired goal for each stressor or current issue identified.;Long Term Goal: Stressors or current issues are controlled or eliminated.;Short Term goal: Identification and review with participant of any Quality of Life or Depression concerns found by scoring the questionnaire.;Long Term goal: The participant improves quality of Life and PHQ9 Scores as seen by post scores and/or verbalization of changes             Quality of Life Scores:  Quality of Life - 08/11/22 1340       Quality of Life   Select Quality of Life      Quality of Life Scores   Health/Function Pre 20.07 %    Socioeconomic Pre 23.5 %  Psych/Spiritual Pre 24.83 %    Family Pre 30 %    GLOBAL Pre 23.17 %            Scores of 19 and below usually indicate a poorer quality of life in these areas.  A difference of  2-3 points is a clinically meaningful difference.  A difference of 2-3 points in the total score of the Quality of Life Index has been associated with significant improvement in overall quality of life, self-image, physical symptoms, and general health in studies assessing  change in quality of life.  PHQ-9: Review Flowsheet       08/11/2022 07/30/2014 07/16/2014 07/10/2014  Depression screen PHQ 2/9  Decreased Interest 1 0 1 1  Down, Depressed, Hopeless 1 2 1 2   PHQ - 2 Score 2 2 2 3   Altered sleeping 1 2 - -  Tired, decreased energy 1 2 - -  Change in appetite 1 1 - -  Feeling bad or failure about yourself  0 2 - -  Trouble concentrating 0 2 - -  Moving slowly or fidgety/restless 0 2 - -  Suicidal thoughts 0 0 - -  PHQ-9 Score 5 13 - -  Difficult doing work/chores Not difficult at all Somewhat difficult - -   Interpretation of Total Score  Total Score Depression Severity:  1-4 = Minimal depression, 5-9 = Mild depression, 10-14 = Moderate depression, 15-19 = Moderately severe depression, 20-27 = Severe depression   Psychosocial Evaluation and Intervention:   Psychosocial Re-Evaluation:  Psychosocial Re-Evaluation     Row Name 08/20/22 1558 09/23/22 0854           Psychosocial Re-Evaluation   Current issues with Current Depression;Current Stress Concerns Current Depression;Current Stress Concerns      Comments Trey Paula started intensive cardiac rehab on 08/19/22. Will review PHQ-9 in the upcoming week. Trey Paula denied concerns or stressors on his first day of exercise Reviewed PHQ2-9 on 08/31/22. Trey Paula denies being depressed. Trey Paula says he just found out he has diabetes and recently moved      Expected Outcomes Trey Paula will have decreased or controlled depression/ stressors upon completion of intensive cardiac rehab Trey Paula will have decreased or controlled depression/ stressors upon completion of intensive cardiac rehab      Interventions Stress management education;Encouraged to attend Cardiac Rehabilitation for the exercise Stress management education;Encouraged to attend Cardiac Rehabilitation for the exercise      Continue Psychosocial Services  Follow up required by staff Follow up required by staff        Initial Review   Source of Stress Concerns None  Identified None Identified;Chronic Illness      Comments Will continue to monitor and offer support as needed Will continue to monitor and offer support as needed               Psychosocial Discharge (Final Psychosocial Re-Evaluation):  Psychosocial Re-Evaluation - 09/23/22 0854       Psychosocial Re-Evaluation   Current issues with Current Depression;Current Stress Concerns    Comments Reviewed PHQ2-9 on 08/31/22. Trey Paula denies being depressed. Trey Paula says he just found out he has diabetes and recently moved    Expected Outcomes Trey Paula will have decreased or controlled depression/ stressors upon completion of intensive cardiac rehab    Interventions Stress management education;Encouraged to attend Cardiac Rehabilitation for the exercise    Continue Psychosocial Services  Follow up required by staff      Initial Review   Source of Stress Concerns None  Identified;Chronic Illness    Comments Will continue to monitor and offer support as needed             Vocational Rehabilitation: Provide vocational rehab assistance to qualifying candidates.   Vocational Rehab Evaluation & Intervention:  Vocational Rehab - 08/11/22 1117       Initial Vocational Rehab Evaluation & Intervention   Assessment shows need for Vocational Rehabilitation No   Pt is retired            Education: Education Goals: Education classes will be provided on a weekly basis, covering required topics. Participant will state understanding/return demonstration of topics presented.    Education     Row Name 08/19/22 1300     Education   Cardiac Education Topics Pritikin   Customer service manager   Weekly Topic Adding Flavor - Sodium-Free   Instruction Review Code 1- Verbalizes Understanding   Class Start Time 1138   Class Stop Time 1210   Class Time Calculation (min) 32 min    Row Name 08/21/22 1200     Education   Cardiac Education Topics Pritikin   Psychologist, forensic Education   General Education Heart Disease Risk Reduction   Instruction Review Code 1- Verbalizes Understanding   Class Start Time 1135   Class Stop Time 1210   Class Time Calculation (min) 35 min    Row Name 08/31/22 1600     Education   Cardiac Education Topics Pritikin   [employer]     Workshops   Educator Exercise Physiologist   [employer] Psychosocial   Psychosocial Workshop Recognizing and Reducing Stress   Instruction Review Code 1- Verbalizes Understanding   Class Start Time 1152   Class Stop Time 1234   Class Time Calculation (min) 42 min    Row Name 09/02/22 1500     Education   Cardiac Education Topics Pritikin   Customer service manager   Weekly Topic Personalizing Your Pritikin Plate   Instruction Review Code 1- Verbalizes Understanding   Class Start Time 1355   Class Stop Time 1437   Class Time Calculation (min) 42 min    Row Name 09/23/22 1300     Education   Cardiac Education Topics Pritikin   Customer service manager   Weekly Topic Efficiency Cooking - Meals in a Snap   Instruction Review Code 1- Verbalizes Understanding   Class Start Time 1140   Class Stop Time 1220   Class Time Calculation (min) 40 min            Core Videos: Exercise    Move It!  Clinical staff conducted group or individual video education with verbal and written material and guidebook.  Patient learns the recommended Pritikin exercise program. Exercise with the goal of living a long, healthy life. Some of the health benefits of exercise include controlled diabetes, healthier blood pressure levels, improved cholesterol levels, improved heart and lung capacity, improved sleep, and better body composition. Everyone should speak with their doctor before starting or changing an exercise routine.  Biomechanical  Limitations Clinical staff conducted group or individual video education with verbal and written material and guidebook.  Patient learns how biomechanical limitations can impact exercise and how we can mitigate and  possibly overcome limitations to have an impactful and balanced exercise routine.  Body Composition Clinical staff conducted group or individual video education with verbal and written material and guidebook.  Patient learns that body composition (ratio of muscle mass to fat mass) is a key component to assessing overall fitness, rather than body weight alone. Increased fat mass, especially visceral belly fat, can put [place] at increased risk for metabolic syndrome, type 2 diabetes, heart disease, and even death. It is recommended to combine diet and exercise (cardiovascular and resistance training) to improve your body composition. Seek guidance from your physician and exercise physiologist before implementing an exercise routine.  Exercise Action Plan Clinical staff conducted group or individual video education with verbal and written material and guidebook.  Patient learns the recommended strategies to achieve and enjoy long-term exercise adherence, including variety, self-motivation, self-efficacy, and positive decision making. Benefits of exercise include fitness, good health, weight management, more energy, better sleep, less stress, and overall well-being.  Medical   Heart Disease Risk Reduction Clinical staff conducted group or individual video education with verbal and written material and guidebook.  Patient learns our heart is our most vital organ as it circulates oxygen, nutrients, white blood cells, and hormones throughout the entire body, and carries waste away. Data supports a plant-based eating plan like the Pritikin Program for its effectiveness in slowing progression of and reversing heart disease. The video provides a number of recommendations to address heart  disease.   Metabolic Syndrome and Belly Fat  Clinical staff conducted group or individual video education with verbal and written material and guidebook.  Patient learns what metabolic syndrome is, how it leads to heart disease, and how one can reverse it and keep it from coming back. You have metabolic syndrome if you have 3 of the following 5 criteria: abdominal obesity, high blood pressure, high triglycerides, low HDL cholesterol, and high blood sugar.  Hypertension and Heart Disease Clinical staff conducted group or individual video education with verbal and written material and guidebook.  Patient learns that high blood pressure, or hypertension, is very common in the [place]. Hypertension is largely due to excessive salt intake, but other important risk factors include being overweight, physical inactivity, drinking too much alcohol, smoking, and not eating enough potassium from fruits and vegetables. High blood pressure is a leading risk factor for heart attack, stroke, congestive heart failure, dementia, kidney failure, and premature death. Long-term effects of excessive salt intake include stiffening of the arteries and thickening of heart muscle and organ damage. Recommendations include ways to reduce hypertension and the risk of heart disease.  Diseases of Our Time - Focusing on Diabetes Clinical staff conducted group or individual video education with verbal and written material and guidebook.  Patient learns why the best way to stop diseases of our time is prevention, through food and other lifestyle changes. Medicine (such as prescription pills and surgeries) is often only a Band-Aid on the problem, not a long-term solution. Most common diseases of our time include obesity, type 2 diabetes, hypertension, heart disease, and cancer. The Pritikin Program is recommended and has been proven to help reduce, reverse, and/or prevent the damaging effects of metabolic syndrome.  Nutrition    Overview of the Pritikin Eating Plan  Clinical staff conducted group or individual video education with verbal and written material and guidebook.  Patient learns about the Pritikin Eating Plan for disease risk reduction. The Pritikin Eating Plan emphasizes a wide variety of unrefined, minimally-processed carbohydrates, like fruits,  vegetables, whole grains, and legumes. Go, Caution, and Stop food choices are explained. Plant-based and lean animal proteins are emphasized. Rationale provided for low sodium intake for blood pressure control, low added sugars for blood sugar stabilization, and low added fats and oils for coronary artery disease risk reduction and weight management.  Calorie Density  Clinical staff conducted group or individual video education with verbal and written material and guidebook.  Patient learns about calorie density and how it impacts the Pritikin Eating Plan. Knowing the characteristics of the food you choose will help you decide whether those foods will lead to weight gain or weight loss, and whether you want to consume more or less of them. Weight loss is usually a side effect of the Pritikin Eating Plan because of its focus on low calorie-dense foods.  Label Reading  Clinical staff conducted group or individual video education with verbal and written material and guidebook.  Patient learns about the Pritikin recommended label reading guidelines and corresponding recommendations regarding calorie density, added sugars, sodium content, and whole grains.  Dining Out - Part 1  Clinical staff conducted group or individual video education with verbal and written material and guidebook.  Patient learns that restaurant meals can be sabotaging because they can be so high in calories, fat, sodium, and/or sugar. Patient learns recommended strategies on how to positively address this and avoid unhealthy pitfalls.  Facts on Fats  Clinical staff conducted group or individual video  education with verbal and written material and guidebook.  Patient learns that lifestyle modifications can be just as effective, if not more so, as many medications for lowering your risk of heart disease. A Pritikin lifestyle can help to reduce your risk of inflammation and atherosclerosis (cholesterol build-up, or plaque, in the artery walls). Lifestyle interventions such as dietary choices and physical activity address the cause of atherosclerosis. A review of the types of fats and their impact on blood cholesterol levels, along with dietary recommendations to reduce fat intake is also included.  Nutrition Action Plan  Clinical staff conducted group or individual video education with verbal and written material and guidebook.  Patient learns how to incorporate Pritikin recommendations into their lifestyle. Recommendations include planning and keeping personal health goals in mind as an important part of their success.  Healthy Mind-Set    Healthy Minds, Bodies, Hearts  Clinical staff conducted group or individual video education with verbal and written material and guidebook.  Patient learns how to identify when they are stressed. Video will discuss the impact of that stress, as well as the many benefits of stress management. Patient will also be introduced to stress management techniques. The way we think, act, and feel has an impact on our hearts.  How Our Thoughts Can Heal Our Hearts  Clinical staff conducted group or individual video education with verbal and written material and guidebook.  Patient learns that negative thoughts can cause depression and anxiety. This can result in negative lifestyle behavior and serious health problems. Cognitive behavioral therapy is an effective method to help control our thoughts in order to change and improve our emotional outlook.  Additional Videos:  Exercise    Improving Performance  Clinical staff conducted group or individual video education with  verbal and written material and guidebook.  Patient learns to use a non-linear approach by alternating intensity levels and lengths of time spent exercising to help burn more calories and lose more body fat. Cardiovascular exercise helps improve heart health, metabolism, hormonal balance, blood sugar  control, and recovery from fatigue. Resistance training improves strength, endurance, balance, coordination, reaction time, metabolism, and muscle mass. Flexibility exercise improves circulation, posture, and balance. Seek guidance from your physician and exercise physiologist before implementing an exercise routine and learn your capabilities and proper form for all exercise.  Introduction to Yoga  Clinical staff conducted group or individual video education with verbal and written material and guidebook.  Patient learns about yoga, a discipline of the coming together of mind, breath, and body. The benefits of yoga include improved flexibility, improved range of motion, better posture and core strength, increased lung function, weight loss, and positive self-image. Yoga's heart health benefits include lowered blood pressure, healthier heart rate, decreased cholesterol and triglyceride levels, improved immune function, and reduced stress. Seek guidance from your physician and exercise physiologist before implementing an exercise routine and learn your capabilities and proper form for all exercise.  Medical   Aging: Enhancing Your Quality of Life  Clinical staff conducted group or individual video education with verbal and written material and guidebook.  Patient learns key strategies and recommendations to stay in good physical health and enhance quality of life, such as prevention strategies, having an advocate, securing a Health Care Proxy and Power of Attorney, and keeping a list of medications and system for tracking them. It also discusses how to avoid risk for bone loss.  Biology of Weight Control   Clinical staff conducted group or individual video education with verbal and written material and guidebook.  Patient learns that weight gain occurs because we consume more calories than we burn (eating more, moving less). Even if your body weight is normal, you may have higher ratios of fat compared to muscle mass. Too much body fat puts you at increased risk for cardiovascular disease, heart attack, stroke, type 2 diabetes, and obesity-related cancers. In addition to exercise, following the Pritikin Eating Plan can help reduce your risk.  Decoding Lab Results  Clinical staff conducted group or individual video education with verbal and written material and guidebook.  Patient learns that lab test reflects one measurement whose values change over time and are influenced by many factors, including medication, stress, sleep, exercise, food, hydration, pre-existing medical conditions, and more. It is recommended to use the knowledge from this video to become more involved with your lab results and evaluate your numbers to speak with your doctor.   Diseases of Our Time - Overview  Clinical staff conducted group or individual video education with verbal and written material and guidebook.  Patient learns that according to the CDC, 50% to 70% of chronic diseases (such as obesity, type 2 diabetes, elevated lipids, hypertension, and heart disease) are avoidable through lifestyle improvements including healthier food choices, listening to satiety cues, and increased physical activity.  Sleep Disorders Clinical staff conducted group or individual video education with verbal and written material and guidebook.  Patient learns how good quality and duration of sleep are important to overall health and well-being. Patient also learns about sleep disorders and how they impact health along with recommendations to address them, including discussing with a physician.  Nutrition  Dining Out - Part 2 Clinical staff  conducted group or individual video education with verbal and written material and guidebook.  Patient learns how to plan ahead and communicate in order to maximize their dining experience in a healthy and nutritious manner. Included are recommended food choices based on the type of restaurant the patient is visiting.   Fueling a Pharmacologist  staff conducted group or individual video education with verbal and written material and guidebook.  There is a strong connection between our food choices and our health. Diseases like obesity and type 2 diabetes are very prevalent and are in large-part due to lifestyle choices. The Pritikin Eating Plan provides plenty of food and hunger-curbing satisfaction. It is easy to follow, affordable, and helps reduce health risks.  Menu Workshop  Clinical staff conducted group or individual video education with verbal and written material and guidebook.  Patient learns that restaurant meals can sabotage health goals because they are often packed with calories, fat, sodium, and sugar. Recommendations include strategies to plan ahead and to communicate with the manager, chef, or server to help order a healthier meal.  Planning Your Eating Strategy  Clinical staff conducted group or individual video education with verbal and written material and guidebook.  Patient learns about the Pritikin Eating Plan and its benefit of reducing the risk of disease. The Pritikin Eating Plan does not focus on calories. Instead, it emphasizes high-quality, nutrient-rich foods. By knowing the characteristics of the foods, we choose, we can determine their calorie density and make informed decisions.  Targeting Your Nutrition Priorities  Clinical staff conducted group or individual video education with verbal and written material and guidebook.  Patient learns that lifestyle habits have a tremendous impact on disease risk and progression. This video provides eating and physical  activity recommendations based on your personal health goals, such as reducing LDL cholesterol, losing weight, preventing or controlling type 2 diabetes, and reducing high blood pressure.  Vitamins and Minerals  Clinical staff conducted group or individual video education with verbal and written material and guidebook.  Patient learns different ways to obtain key vitamins and minerals, including through a recommended healthy diet. It is important to discuss all supplements you take with your doctor.   Healthy Mind-Set    Smoking Cessation  Clinical staff conducted group or individual video education with verbal and written material and guidebook.  Patient learns that cigarette smoking and tobacco addiction pose a serious health risk which affects millions of people. Stopping smoking will significantly reduce the risk of heart disease, lung disease, and many forms of cancer. Recommended strategies for quitting are covered, including working with your doctor to develop a successful plan.  Culinary   Becoming a Set designer conducted group or individual video education with verbal and written material and guidebook.  Patient learns that cooking at home can be healthy, cost-effective, quick, and puts them in control. Keys to cooking healthy recipes will include looking at your recipe, assessing your equipment needs, planning ahead, making it simple, choosing cost-effective seasonal ingredients, and limiting the use of added fats, salts, and sugars.  Cooking - Breakfast and Snacks  Clinical staff conducted group or individual video education with verbal and written material and guidebook.  Patient learns how important breakfast is to satiety and nutrition through the entire day. Recommendations include key foods to eat during breakfast to help stabilize blood sugar levels and to prevent overeating at meals later in the day. Planning ahead is also a key component.  Cooking - Psychologist, educational conducted group or individual video education with verbal and written material and guidebook.  Patient learns eating strategies to improve overall health, including an approach to cook more at home. Recommendations include thinking of animal protein as a side on your plate rather than center stage and focusing instead on lower calorie  dense options like vegetables, fruits, whole grains, and plant-based proteins, such as beans. Making sauces in large quantities to freeze for later and leaving the skin on your vegetables are also recommended to maximize your experience.  Cooking - Healthy Salads and Dressing Clinical staff conducted group or individual video education with verbal and written material and guidebook.  Patient learns that vegetables, fruits, whole grains, and legumes are the foundations of the Pritikin Eating Plan. Recommendations include how to incorporate each of these in flavorful and healthy salads, and how to create homemade salad dressings. Proper handling of ingredients is also covered. Cooking - Soups and [employer] - Soups and Desserts Clinical staff conducted group or individual video education with verbal and written material and guidebook.  Patient learns that Pritikin soups and desserts make for easy, nutritious, and delicious snacks and meal components that are low in sodium, fat, sugar, and calorie density, while high in vitamins, minerals, and filling fiber. Recommendations include simple and healthy ideas for soups and desserts.   Overview     The Pritikin Solution Program Overview Clinical staff conducted group or individual video education with verbal and written material and guidebook.  Patient learns that the results of the Pritikin Program have been documented in more than 100 articles published in peer-reviewed journals, and the benefits include reducing risk factors for (and, in some cases, even reversing) high cholesterol, high  blood pressure, type 2 diabetes, obesity, and more! An overview of the three key pillars of the Pritikin Program will be covered: eating well, doing regular exercise, and having a healthy mind-set.  WORKSHOPS  Exercise: Exercise Basics: Building Your Action Plan Clinical staff led group instruction and group discussion with PowerPoint presentation and patient guidebook. To enhance the learning environment the use of posters, models and videos may be added. At the conclusion of this workshop, patients will comprehend the difference between physical activity and exercise, as well as the benefits of incorporating both, into their routine. Patients will understand the FITT (Frequency, Intensity, Time, and Type) principle and how to use it to build an exercise action plan. In addition, safety concerns and other considerations for exercise and cardiac rehab will be addressed by the presenter. The purpose of this lesson is to promote a comprehensive and effective weekly exercise routine in order to improve patients' overall level of fitness.   Managing Heart Disease: Your Path to a Healthier Heart Clinical staff led group instruction and group discussion with PowerPoint presentation and patient guidebook. To enhance the learning environment the use of posters, models and videos may be added.At the conclusion of this workshop, patients will understand the anatomy and physiology of the heart. Additionally, they will understand how Pritikin's three pillars impact the risk factors, the progression, and the management of heart disease.  The purpose of this lesson is to provide a high-level overview of the heart, heart disease, and how the Pritikin lifestyle positively impacts risk factors.  Exercise Biomechanics Clinical staff led group instruction and group discussion with PowerPoint presentation and patient guidebook. To enhance the learning environment the use of posters, models and videos may be added.  Patients will learn how the structural parts of their bodies function and how these functions impact their daily activities, movement, and exercise. Patients will learn how to promote a neutral spine, learn how to manage pain, and identify ways to improve their physical movement in order to promote healthy living. The purpose of this lesson is to expose patients to common physical  limitations that impact physical activity. Participants will learn practical ways to adapt and manage aches and pains, and to minimize their effect on regular exercise. Patients will learn how to maintain good posture while sitting, walking, and lifting.  Balance Training and Fall Prevention  Clinical staff led group instruction and group discussion with PowerPoint presentation and patient guidebook. To enhance the learning environment the use of posters, models and videos may be added. At the conclusion of this workshop, patients will understand the importance of their sensorimotor skills (vision, proprioception, and the vestibular system) in maintaining their ability to balance as they age. Patients will apply a variety of balancing exercises that are appropriate for their current level of function. Patients will understand the common causes for poor balance, possible solutions to these problems, and ways to modify their physical environment in order to minimize their fall risk. The purpose of this lesson is to teach patients about the importance of maintaining balance as they age and ways to minimize their risk of falling.  WORKSHOPS   Nutrition:  Fueling a Ship broker led group instruction and group discussion with PowerPoint presentation and patient guidebook. To enhance the learning environment the use of posters, models and videos may be added. Patients will review the foundational principles of the Pritikin Eating Plan and understand what constitutes a serving size in each of the food groups.  Patients will also learn Pritikin-friendly foods that are better choices when away from home and review make-ahead meal and snack options. Calorie density will be reviewed and applied to three nutrition priorities: weight maintenance, weight loss, and weight gain. The purpose of this lesson is to reinforce (in a group setting) the key concepts around what patients are recommended to eat and how to apply these guidelines when away from home by planning and selecting Pritikin-friendly options. Patients will understand how calorie density may be adjusted for different weight management goals.  Mindful Eating  Clinical staff led group instruction and group discussion with PowerPoint presentation and patient guidebook. To enhance the learning environment the use of posters, models and videos may be added. Patients will briefly review the concepts of the Pritikin Eating Plan and the importance of low-calorie dense foods. The concept of mindful eating will be introduced as well as the importance of paying attention to internal hunger signals. Triggers for non-hunger eating and techniques for dealing with triggers will be explored. The purpose of this lesson is to provide patients with the opportunity to review the basic principles of the Pritikin Eating Plan, discuss the value of eating mindfully and how to measure internal cues of hunger and fullness using the Hunger Scale. Patients will also discuss reasons for non-hunger eating and learn strategies to use for controlling emotional eating.  Targeting Your Nutrition Priorities Clinical staff led group instruction and group discussion with PowerPoint presentation and patient guidebook. To enhance the learning environment the use of posters, models and videos may be added. Patients will learn how to determine their genetic susceptibility to disease by reviewing their family history. Patients will gain insight into the importance of diet as part of an overall healthy  lifestyle in mitigating the impact of genetics and other environmental insults. The purpose of this lesson is to provide patients with the opportunity to assess their personal nutrition priorities by looking at their family history, their own health history and current risk factors. Patients will also be able to discuss ways of prioritizing and modifying the Pritikin Eating Plan for their  highest risk areas  Menu  Clinical staff led group instruction and group discussion with PowerPoint presentation and patient guidebook. To enhance the learning environment the use of posters, models and videos may be added. Using menus brought in from [employer], or printed from [employer], patients will apply the Pritikin dining out guidelines that were presented in the [employer] video. Patients will also be able to practice these guidelines in a variety of provided scenarios. The purpose of this lesson is to provide patients with the opportunity to practice hands-on learning of the Pritikin Dining Out guidelines with actual menus and practice scenarios.  Label Reading Clinical staff led group instruction and group discussion with PowerPoint presentation and patient guidebook. To enhance the learning environment the use of posters, models and videos may be added. Patients will review and discuss the Pritikin label reading guidelines presented in [employer]'s Label Reading Educational series video. Using fool labels brought in from local grocery stores and markets, patients will apply the label reading guidelines and determine if the packaged food meet the Pritikin guidelines. The purpose of this lesson is to provide patients with the opportunity to review, discuss, and practice hands-on learning of the Pritikin Label Reading guidelines with actual packaged food labels. Cooking School  Pritikin's [employer] are designed to teach patients ways to prepare quick, simple, and  affordable recipes at home. The importance of nutrition's role in chronic disease risk reduction is reflected in its emphasis in the overall Pritikin program. By learning how to prepare essential core Pritikin Eating Plan recipes, patients will increase control over what they eat; be able to customize the flavor of foods without the use of added salt, sugar, or fat; and improve the quality of the food they consume. By learning a set of core recipes which are easily assembled, quickly prepared, and affordable, patients are more likely to prepare more healthy foods at home. These workshops focus on convenient breakfasts, simple entres, side dishes, and desserts which can be prepared with minimal effort and are consistent with nutrition recommendations for cardiovascular risk reduction. Cooking [employer] are taught by a Armed forces logistics/support/administrative officer (RD) who has been trained by the [employer]. The chef or RD has a clear understanding of the importance of minimizing - if not completely eliminating - added fat, sugar, and sodium in recipes. Throughout the series of Cooking School Workshop sessions, patients will learn about healthy ingredients and efficient methods of cooking to build confidence in their capability to prepare    Cooking School weekly topics:  Adding Flavor- Sodium-Free  Fast and Healthy Breakfasts  Powerhouse Plant-Based Proteins  Satisfying Salads and Dressings  Simple Sides and Sauces  International Cuisine-Spotlight on the [employer] Zones  Delicious Desserts  Savory Soups  [employer] - Meals in a Astronomer Appetizers and Snacks  Comforting Weekend Breakfasts  One-Pot Wonders   Fast Evening Meals  Landscape architect Your Pritikin Plate  WORKSHOPS   Healthy Mindset (Psychosocial):  Focused Goals, Sustainable Changes Clinical staff led group instruction and group discussion with PowerPoint presentation and patient guidebook. To enhance  the learning environment the use of posters, models and videos may be added. Patients will be able to apply effective goal setting strategies to establish at least one personal goal, and then take consistent, meaningful action toward that goal. They will learn to identify common barriers to achieving personal goals and develop strategies to overcome them. Patients will also gain an  understanding of how our mind-set can impact our ability to achieve goals and the importance of cultivating a positive and growth-oriented mind-set. The purpose of this lesson is to provide patients with a deeper understanding of how to set and achieve personal goals, as well as the tools and strategies needed to overcome common obstacles which may arise along the way.  From Head to Heart: The Power of a Healthy Outlook  Clinical staff led group instruction and group discussion with PowerPoint presentation and patient guidebook. To enhance the learning environment the use of posters, models and videos may be added. Patients will be able to recognize and describe the impact of emotions and mood on physical health. They will discover the importance of self-care and explore self-care practices which may work for them. Patients will also learn how to utilize the 4 C's to cultivate a healthier outlook and better manage stress and challenges. The purpose of this lesson is to demonstrate to patients how a healthy outlook is an essential part of maintaining good health, especially as they continue their cardiac rehab journey.  Healthy Sleep for a Healthy Heart Clinical staff led group instruction and group discussion with PowerPoint presentation and patient guidebook. To enhance the learning environment the use of posters, models and videos may be added. At the conclusion of this workshop, patients will be able to demonstrate knowledge of the importance of sleep to overall health, well-being, and quality of life. They will understand the  symptoms of, and treatments for, common sleep disorders. Patients will also be able to identify daytime and nighttime behaviors which impact sleep, and they will be able to apply these tools to help manage sleep-related challenges. The purpose of this lesson is to provide patients with a general overview of sleep and outline the importance of quality sleep. Patients will learn about a few of the most common sleep disorders. Patients will also be introduced to the concept of "sleep hygiene," and discover ways to self-manage certain sleeping problems through simple daily behavior changes. Finally, the workshop will motivate patients by clarifying the links between quality sleep and their goals of heart-healthy living.   Recognizing and Reducing Stress Clinical staff led group instruction and group discussion with PowerPoint presentation and patient guidebook. To enhance the learning environment the use of posters, models and videos may be added. At the conclusion of this workshop, patients will be able to understand the types of stress reactions, differentiate between acute and chronic stress, and recognize the impact that chronic stress has on their health. They will also be able to apply different coping mechanisms, such as reframing negative self-talk. Patients will have the opportunity to practice a variety of stress management techniques, such as deep abdominal breathing, progressive muscle relaxation, and/or guided imagery.  The purpose of this lesson is to educate patients on the role of stress in their lives and to provide healthy techniques for coping with it.  Learning Barriers/Preferences:  Learning Barriers/Preferences - 08/11/22 1118       Learning Barriers/Preferences   Learning Barriers Sight    Learning Preferences Verbal Instruction;Video;Skilled Demonstration;Pictoral;Individual Instruction;Group Instruction;Computer/Internet             Education Topics:  Knowledge Questionnaire  Score:  Knowledge Questionnaire Score - 08/11/22 1341       Knowledge Questionnaire Score   Pre Score 19/24             Core Components/Risk Factors/Patient Goals at Admission:  Personal Goals and Risk Factors at Admission -  08/11/22 1116       Core Components/Risk Factors/Patient Goals on Admission   Heart Failure Yes    Intervention Provide a combined exercise and nutrition program that is supplemented with education, support and counseling about heart failure. Directed toward relieving symptoms such as shortness of breath, decreased exercise tolerance, and extremity edema.    Expected Outcomes Improve functional capacity of life;Short term: Attendance in program 2-3 days a week with increased exercise capacity. Reported lower sodium intake. Reported increased fruit and vegetable intake. Reports medication compliance.;Short term: Daily weights obtained and reported for increase. Utilizing diuretic protocols set by physician.;Long term: Adoption of self-care skills and reduction of barriers for early signs and symptoms recognition and intervention leading to self-care maintenance.    Hypertension Yes    Intervention Provide education on lifestyle modifcations including regular physical activity/exercise, weight management, moderate sodium restriction and increased consumption of fresh fruit, vegetables, and low fat dairy, alcohol moderation, and smoking cessation.;Monitor prescription use compliance.    Expected Outcomes Short Term: Continued assessment and intervention until BP is < 140/51mm HG in hypertensive participants. < 130/48mm HG in hypertensive participants with diabetes, heart failure or chronic kidney disease.;Long Term: Maintenance of blood pressure at goal levels.    Lipids Yes    Intervention Provide education and support for participant on nutrition & aerobic/resistive exercise along with prescribed medications to achieve LDL 70mg , HDL >40mg .    Expected Outcomes Short  Term: Participant states understanding of desired cholesterol values and is compliant with medications prescribed. Participant is following exercise prescription and nutrition guidelines.;Long Term: Cholesterol controlled with medications as prescribed, with individualized exercise RX and with personalized nutrition plan. Value goals: LDL < 70mg , HDL > 40 mg.             Core Components/Risk Factors/Patient Goals Review:   Goals and Risk Factor Review     Row Name 08/20/22 1604 08/25/22 1641 09/23/22 0859         Core Components/Risk Factors/Patient Goals Review   Personal Goals Review Weight Management/Obesity;Lipids;Heart Failure;Hypertension Weight Management/Obesity;Lipids;Heart Failure;Hypertension Weight Management/Obesity;Lipids;Heart Failure;Hypertension;Diabetes     Review Trey Paula started intensive cardiac rehab on 08/19/22. Vital signs were stable. Trey Paula did well with exercise. Trey Paula started intensive cardiac rehab on 08/19/22. and is off to a good start to  exercise. Trey Paula returned to exercise on 09/21/22 after being absent for several days. Trey Paula is now taking metformin for diabetes. CBG's WNL. Trey Paula is checking his CBg's at home. Trey Paula is down 1.2 kg since starting cardiac rehab     Expected Outcomes Trey Paula will continue to participate in intensive cardiac rehab for exercise, nutrition and lifestyle modifications Trey Paula will continue to participate in intensive cardiac rehab for exercise, nutrition and lifestyle modifications Trey Paula will continue to participate in intensive cardiac rehab for exercise, nutrition and lifestyle modifications              Core Components/Risk Factors/Patient Goals at Discharge (Final Review):   Goals and Risk Factor Review - 09/23/22 0859       Core Components/Risk Factors/Patient Goals Review   Personal Goals Review Weight Management/Obesity;Lipids;Heart Failure;Hypertension;Diabetes    Review Trey Paula returned to exercise on 09/21/22 after being absent for  several days. Trey Paula is now taking metformin for diabetes. CBG's WNL. Trey Paula is checking his CBg's at home. Trey Paula is down 1.2 kg since starting cardiac rehab    Expected Outcomes Trey Paula will continue to participate in intensive cardiac rehab for exercise, nutrition and lifestyle modifications  ITP Comments:  ITP Comments     Row Name 08/11/22 1610 08/20/22 1555 08/25/22 1638 09/23/22 0849     ITP Comments Armanda Magic, MD: Medical Director.  Introduction to the Pritikin Education Program/Intensive Cardiac Rehab.  Initial orientation packet reviewed with the patient. 30 Day ITP Review. Trey Paula started intensive cardiac rehab on 08/19/22 and did well with execise for his fitness level 30 Day ITP Review. Trey Paula started intensive cardiac rehab on 08/19/22 and is off to a good start to exercise 30 Day ITP Review. Trey Paula returned to exercise 09/21/22 at cardiac rehab after being absent for several days. Did well with exercise             Comments: See ITP comments.Thayer Headings RN BSN

## 2022-09-25 ENCOUNTER — Encounter (HOSPITAL_COMMUNITY): Payer: Self-pay

## 2022-09-25 ENCOUNTER — Inpatient Hospital Stay (HOSPITAL_COMMUNITY)
Admission: EM | Admit: 2022-09-25 | Discharge: 2022-09-26 | DRG: 303 | Disposition: A | Discharge status: Home / Self Care | Payer: No Typology Code available for payment source | Attending: Student in an Organized Health Care Education/Training Program | Admitting: Student in an Organized Health Care Education/Training Program

## 2022-09-25 ENCOUNTER — Emergency Department (HOSPITAL_COMMUNITY): Payer: No Typology Code available for payment source

## 2022-09-25 ENCOUNTER — Encounter (HOSPITAL_COMMUNITY): Payer: 59

## 2022-09-25 ENCOUNTER — Other Ambulatory Visit: Payer: Self-pay

## 2022-09-25 DIAGNOSIS — I25110 Atherosclerotic heart disease of native coronary artery with unstable angina pectoris: Secondary | ICD-10-CM | POA: Diagnosis not present

## 2022-09-25 DIAGNOSIS — E119 Type 2 diabetes mellitus without complications: Secondary | ICD-10-CM | POA: Diagnosis not present

## 2022-09-25 DIAGNOSIS — I5020 Unspecified systolic (congestive) heart failure: Secondary | ICD-10-CM

## 2022-09-25 DIAGNOSIS — F17210 Nicotine dependence, cigarettes, uncomplicated: Secondary | ICD-10-CM | POA: Diagnosis present

## 2022-09-25 DIAGNOSIS — N179 Acute kidney failure, unspecified: Secondary | ICD-10-CM | POA: Diagnosis present

## 2022-09-25 DIAGNOSIS — Z8673 Personal history of transient ischemic attack (TIA), and cerebral infarction without residual deficits: Secondary | ICD-10-CM | POA: Diagnosis not present

## 2022-09-25 DIAGNOSIS — E1122 Type 2 diabetes mellitus with diabetic chronic kidney disease: Secondary | ICD-10-CM | POA: Diagnosis present

## 2022-09-25 DIAGNOSIS — I25119 Atherosclerotic heart disease of native coronary artery with unspecified angina pectoris: Secondary | ICD-10-CM | POA: Diagnosis present

## 2022-09-25 DIAGNOSIS — R079 Chest pain, unspecified: Secondary | ICD-10-CM | POA: Diagnosis not present

## 2022-09-25 DIAGNOSIS — N189 Chronic kidney disease, unspecified: Secondary | ICD-10-CM | POA: Diagnosis present

## 2022-09-25 DIAGNOSIS — Z7984 Long term (current) use of oral hypoglycemic drugs: Secondary | ICD-10-CM

## 2022-09-25 DIAGNOSIS — I110 Hypertensive heart disease with heart failure: Secondary | ICD-10-CM | POA: Diagnosis not present

## 2022-09-25 DIAGNOSIS — I429 Cardiomyopathy, unspecified: Secondary | ICD-10-CM | POA: Diagnosis present

## 2022-09-25 DIAGNOSIS — Z79899 Other long term (current) drug therapy: Secondary | ICD-10-CM | POA: Diagnosis not present

## 2022-09-25 DIAGNOSIS — Z955 Presence of coronary angioplasty implant and graft: Secondary | ICD-10-CM | POA: Diagnosis not present

## 2022-09-25 DIAGNOSIS — Z7902 Long term (current) use of antithrombotics/antiplatelets: Secondary | ICD-10-CM | POA: Diagnosis not present

## 2022-09-25 DIAGNOSIS — I130 Hypertensive heart and chronic kidney disease with heart failure and stage 1 through stage 4 chronic kidney disease, or unspecified chronic kidney disease: Secondary | ICD-10-CM | POA: Diagnosis present

## 2022-09-25 DIAGNOSIS — I5022 Chronic systolic (congestive) heart failure: Secondary | ICD-10-CM | POA: Diagnosis present

## 2022-09-25 DIAGNOSIS — E785 Hyperlipidemia, unspecified: Secondary | ICD-10-CM | POA: Diagnosis present

## 2022-09-25 DIAGNOSIS — M7989 Other specified soft tissue disorders: Secondary | ICD-10-CM | POA: Diagnosis present

## 2022-09-25 DIAGNOSIS — I25118 Atherosclerotic heart disease of native coronary artery with other forms of angina pectoris: Principal | ICD-10-CM | POA: Diagnosis present

## 2022-09-25 DIAGNOSIS — Z7901 Long term (current) use of anticoagulants: Secondary | ICD-10-CM

## 2022-09-25 DIAGNOSIS — Z7982 Long term (current) use of aspirin: Secondary | ICD-10-CM | POA: Diagnosis not present

## 2022-09-25 DIAGNOSIS — I447 Left bundle-branch block, unspecified: Secondary | ICD-10-CM | POA: Diagnosis present

## 2022-09-25 DIAGNOSIS — I639 Cerebral infarction, unspecified: Secondary | ICD-10-CM | POA: Insufficient documentation

## 2022-09-25 DIAGNOSIS — E1140 Type 2 diabetes mellitus with diabetic neuropathy, unspecified: Secondary | ICD-10-CM | POA: Diagnosis present

## 2022-09-25 LAB — COMPREHENSIVE METABOLIC PANEL
ALT: 21 U/L (ref 0–44)
AST: 21 U/L (ref 15–41)
Albumin: 3.7 g/dL (ref 3.5–5.0)
Alkaline Phosphatase: 44 U/L (ref 38–126)
Anion gap: 14 (ref 5–15)
BUN: 34 mg/dL — ABNORMAL HIGH (ref 8–23)
CO2: 12 mmol/L — ABNORMAL LOW (ref 22–32)
Calcium: 9.4 mg/dL (ref 8.9–10.3)
Chloride: 109 mmol/L (ref 98–111)
Creatinine, Ser: 1.55 mg/dL — ABNORMAL HIGH (ref 0.61–1.24)
GFR, Estimated: 49 mL/min — ABNORMAL LOW (ref >60)
Glucose, Bld: 105 mg/dL — ABNORMAL HIGH (ref 70–99)
Potassium: 4.7 mmol/L (ref 3.5–5.1)
Sodium: 135 mmol/L (ref 135–145)
Total Bilirubin: 0.4 mg/dL (ref 0.3–1.2)
Total Protein: 6.7 g/dL (ref 6.5–8.1)

## 2022-09-25 LAB — CBC WITH DIFFERENTIAL/PLATELET
Abs Immature Granulocytes: 0.01 10*3/uL (ref 0.00–0.07)
Basophils Absolute: 0 10*3/uL (ref 0.0–0.1)
Basophils Relative: 1 %
Eosinophils Absolute: 0 10*3/uL (ref 0.0–0.5)
Eosinophils Relative: 0 %
HCT: 42.9 % (ref 39.0–52.0)
Hemoglobin: 13.8 g/dL (ref 13.0–17.0)
Immature Granulocytes: 0 %
Lymphocytes Relative: 29 %
Lymphs Abs: 1.8 10*3/uL (ref 0.7–4.0)
MCH: 29.8 pg (ref 26.0–34.0)
MCHC: 32.2 g/dL (ref 30.0–36.0)
MCV: 92.7 fL (ref 80.0–100.0)
Monocytes Absolute: 0.6 10*3/uL (ref 0.1–1.0)
Monocytes Relative: 10 %
Neutro Abs: 3.7 10*3/uL (ref 1.7–7.7)
Neutrophils Relative %: 60 %
Platelets: 145 10*3/uL — ABNORMAL LOW (ref 150–400)
RBC: 4.63 MIL/uL (ref 4.22–5.81)
RDW: 13.5 % (ref 11.5–15.5)
WBC: 6.1 10*3/uL (ref 4.0–10.5)
nRBC: 0 % (ref 0.0–0.2)

## 2022-09-25 LAB — BLOOD GAS, VENOUS
Acid-Base Excess: 2 mmol/L (ref 0.0–2.0)
Bicarbonate: 26.5 mmol/L (ref 20.0–28.0)
Drawn by: 164
O2 Saturation: 54.5 %
Patient temperature: 37
pCO2, Ven: 40 mmHg — ABNORMAL LOW (ref 44–60)
pH, Ven: 7.43 (ref 7.25–7.43)
pO2, Ven: <31 mmHg — CL (ref 32–45)

## 2022-09-25 LAB — I-STAT VENOUS BLOOD GAS, ED
Acid-base deficit: 2 mmol/L (ref 0.0–2.0)
Bicarbonate: 19.2 mmol/L — ABNORMAL LOW (ref 20.0–28.0)
Calcium, Ion: 1.12 mmol/L — ABNORMAL LOW (ref 1.15–1.40)
HCT: 40 % (ref 39.0–52.0)
Hemoglobin: 13.6 g/dL (ref 13.0–17.0)
O2 Saturation: 98 %
Potassium: 4.4 mmol/L (ref 3.5–5.1)
Sodium: 138 mmol/L (ref 135–145)
TCO2: 20 mmol/L — ABNORMAL LOW (ref 22–32)
pCO2, Ven: 24.7 mmHg — ABNORMAL LOW (ref 44–60)
pH, Ven: 7.498 — ABNORMAL HIGH (ref 7.25–7.43)
pO2, Ven: 87 mmHg — ABNORMAL HIGH (ref 32–45)

## 2022-09-25 LAB — HIV ANTIBODY (ROUTINE TESTING W REFLEX): HIV Screen 4th Generation wRfx: NONREACTIVE

## 2022-09-25 LAB — BASIC METABOLIC PANEL
Anion gap: 9 (ref 5–15)
BUN: 32 mg/dL — ABNORMAL HIGH (ref 8–23)
CO2: 21 mmol/L — ABNORMAL LOW (ref 22–32)
Calcium: 9.6 mg/dL (ref 8.9–10.3)
Chloride: 105 mmol/L (ref 98–111)
Creatinine, Ser: 1.57 mg/dL — ABNORMAL HIGH (ref 0.61–1.24)
GFR, Estimated: 49 mL/min — ABNORMAL LOW (ref >60)
Glucose, Bld: 108 mg/dL — ABNORMAL HIGH (ref 70–99)
Potassium: 4.6 mmol/L (ref 3.5–5.1)
Sodium: 135 mmol/L (ref 135–145)

## 2022-09-25 LAB — TROPONIN I (HIGH SENSITIVITY)
Troponin I (High Sensitivity): 55 ng/L — ABNORMAL HIGH (ref <18)
Troponin I (High Sensitivity): 39 ng/L — ABNORMAL HIGH (ref <18)

## 2022-09-25 LAB — LIPASE, BLOOD: Lipase: 35 U/L (ref 11–51)

## 2022-09-25 LAB — TSH: TSH: 0.533 u[IU]/mL (ref 0.350–4.500)

## 2022-09-25 LAB — LACTIC ACID, PLASMA
Lactic Acid, Venous: 1 mmol/L (ref 0.5–1.9)
Lactic Acid, Venous: 1.3 mmol/L (ref 0.5–1.9)

## 2022-09-25 LAB — BRAIN NATRIURETIC PEPTIDE: B Natriuretic Peptide: 174.1 pg/mL — ABNORMAL HIGH (ref 0.0–100.0)

## 2022-09-25 LAB — D-DIMER, QUANTITATIVE: D-Dimer, Quant: <0.27 ug/mL-FEU (ref 0.00–0.50)

## 2022-09-25 MED ORDER — SENNOSIDES-DOCUSATE SODIUM 8.6-50 MG PO TABS: 1.0000 | ORAL_TABLET | Freq: Every evening | ORAL | Status: DC | PRN

## 2022-09-25 MED ORDER — NITROGLYCERIN 0.4 MG SL SUBL
0.4000 mg | SUBLINGUAL_TABLET | SUBLINGUAL | Status: DC | PRN
  Administered 2022-09-25: 0.4 mg via SUBLINGUAL
  Filled 2022-09-25: qty 1

## 2022-09-25 MED ORDER — ACETAMINOPHEN 325 MG PO TABS: 650.0000 mg | ORAL_TABLET | Freq: Four times a day (QID) | ORAL | Status: DC | PRN

## 2022-09-25 MED ORDER — ATORVASTATIN CALCIUM 80 MG PO TABS
80.0000 mg | ORAL_TABLET | Freq: Every day | ORAL | Status: DC
  Administered 2022-09-25: 80 mg via ORAL
  Filled 2022-09-25: qty 1

## 2022-09-25 MED ORDER — APIXABAN 5 MG PO TABS
5.0000 mg | ORAL_TABLET | Freq: Two times a day (BID) | ORAL | Status: DC
  Administered 2022-09-25 – 2022-09-26 (×2): 5 mg via ORAL
  Filled 2022-09-25 (×2): qty 1

## 2022-09-25 MED ORDER — ACETAMINOPHEN 650 MG RE SUPP: 650.0000 mg | Freq: Four times a day (QID) | RECTAL | Status: DC | PRN

## 2022-09-25 MED ORDER — CARVEDILOL 12.5 MG PO TABS
12.5000 mg | ORAL_TABLET | Freq: Two times a day (BID) | ORAL | Status: DC
  Administered 2022-09-26: 12.5 mg via ORAL
  Filled 2022-09-25: qty 1

## 2022-09-25 MED ORDER — CARVEDILOL 12.5 MG PO TABS: 12.5000 mg | ORAL_TABLET | Freq: Two times a day (BID) | ORAL | Status: DC

## 2022-09-25 MED ORDER — ISOSORBIDE MONONITRATE ER 30 MG PO TB24: 15.0000 mg | ORAL_TABLET | Freq: Every day | ORAL | Status: DC

## 2022-09-25 MED ORDER — CLOPIDOGREL BISULFATE 75 MG PO TABS
75.0000 mg | ORAL_TABLET | Freq: Every day | ORAL | Status: DC
  Administered 2022-09-25 – 2022-09-26 (×2): 75 mg via ORAL
  Filled 2022-09-25 (×2): qty 1

## 2022-09-25 MED ORDER — ISOSORBIDE MONONITRATE ER 30 MG PO TB24
30.0000 mg | ORAL_TABLET | Freq: Every day | ORAL | Status: DC
  Administered 2022-09-25 – 2022-09-26 (×2): 30 mg via ORAL
  Filled 2022-09-25 (×2): qty 1

## 2022-09-25 MED ORDER — ISOSORBIDE MONONITRATE ER 30 MG PO TB24: 30.0000 mg | ORAL_TABLET | Freq: Every day | ORAL | Status: DC

## 2022-09-25 MED ORDER — ASPIRIN 81 MG PO CHEW
324.0000 mg | CHEWABLE_TABLET | Freq: Once | ORAL | Status: AC
  Administered 2022-09-25: 324 mg via ORAL
  Filled 2022-09-25: qty 4

## 2022-09-25 NOTE — ED Notes (Signed)
09/25/2022  

## 2022-09-25 NOTE — ED Triage Notes (Signed)
Pt to the ed form getting blood work with a CC of chest pain with sob x 1 day. Pt relays he has a lot of pressure on his chest. Pt relays he had stents placed last month and has a hx of stroke. Pt denies LOC, dizziness.

## 2022-09-25 NOTE — H&P (Signed)
Date: 09/25/2022               Patient Name:  Steven Brady MRN: [id number]  DOB: 12/16/1955 Age / Sex: 67 y.o., male   PCP: Pcp, No         Medical Service: Internal Medicine Teaching Service         Attending Physician: Dr. Inez Catalina, MD    First Contact: Dr. Hessie Diener Pager: [phone]  Second Contact: Dr. Lily Kocher Pager: [phone]       After Hours (After 5p/  First Contact Pager: [phone]  weekends / holidays): Second Contact Pager: [phone]   Chief Complaint: Chest Pain/Dyspnea  History of Present Illness:  Patient with PMHx of HFrEF, HTN, DMII, HLD c/b CAD presenting to the ED POV for chest pain of sudden onset.Patient sat on recliner and immediately started having shortness of breath and chest pain. The pain increased in intensity and did not relieve with any interventions. Pt did not check BP today but checks it daily. Pt goes to cardiac rehab but missed last week due to moving.   Did notice bilateral leg swelling couple days ago. No fevers, chills, or sick contacts.    Pain was radiating to the jaw and down the arm. Pain has resolved except on deep breathing.   Pt had cardiac cath with 3 stents this April in [place].   Review of Systems negative unless stated in the HPI.  In the ED, patient was given aspirin. No hypoxia was noted in triage. Lab work ordered showing mildly elevated troponin. Cardiology consulted and planned to start IV heparin until ACS can be ruled out given cardiac hx.   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   recently hurt left ankle - 2 weeks ago   CHF (congestive heart failure) (HCC) 2015   Hematuria - cause not known 06/08/11   pt has been seeing blood off and on in urine   Hyperlipidemia Dx 2015   Hypertension Dx 2015   Tobacco abuse    since age of 11   Meds:  Current Outpatient Medications  Medication Instructions   Accu-Chek Softclix Lancets lancets 1 each, Other, 2 times daily   apixaban  (ELIQUIS) 5 mg, Oral, 2 times daily   atorvastatin (LIPITOR) 40 mg, Oral, Daily   carvedilol (COREG) 12.5 mg, Oral, 2 times daily   clopidogrel (PLAVIX) 75 mg, Oral, Daily   dapagliflozin propanediol (FARXIGA) 10 mg, Oral, Daily   furosemide (LASIX) 40 mg, Oral, Daily PRN   glucose blood (ACCU-CHEK GUIDE) test strip 1 each, Other, As needed   hydrALAZINE (APRESOLINE) 25 mg, Oral, Every 8 hours   meclizine (ANTIVERT) 25 mg, Oral, 2 times daily   metFORMIN (GLUCOPHAGE) 500 mg, Oral, 2 times daily with meals   Multiple Vitamins-Minerals (CENTRUM ADULTS PO) 1 tablet, Oral, Daily   potassium chloride SA (KLOR-CON M) 20 MEQ tablet 20 mEq, Oral, Daily, Takes 2 tablets for a total of 40 meq   sacubitril-valsartan (ENTRESTO) 97-103 MG 1 tablet, Oral, 2 times daily   spironolactone (ALDACTONE) 25 mg, Oral, Daily   UNABLE TO FIND Outpatient vestibular rehabilitation. Outpatient Physical therapy   Vitamin D3 2,000 Units, Oral, Daily     Allergies: Allergies as of 09/25/2022   (No Known Allergies)    Past Surgical History: Catherization 2024  Family History:  Family History  Problem Relation Age of Onset   Dementia Mother    Dementia Father  Social History: No alcohol, previous smoker 25 pack year hx. Occasional marijuana use. Retired Technical sales engineer. Lives with girlfriend. Has 2 sons and daughter Can do all ADLs and IADLs.   Physical Exam: Blood pressure (!) 128/102, pulse 67, temperature 98.1 F (36.7 C), temperature source Oral, resp. rate 19, height 5\' 9"  (1.753 m), weight 90.2 kg, SpO2 95 %. General: NAD laying in bed HENT: NCAT Lungs: CTAB Cardiovascular: NSR, good pulses in all extremities, No JVD, No LE edema Abdomen: Slight TTP in RUQ, good bowel sounds MSK: No asymmetry Skin: no rashes Neuro: alert and oriented x4 Psych: normal mood and normal affect  Diagnostics:     Latest Ref Rng & Units 09/25/2022    1:56 PM 09/25/2022   10:08 AM 07/10/2014    2:35 PM  CBC  WBC 4.0 -  10.5 K/uL  6.1  6.7   Hemoglobin 13.0 - 17.0 g/dL 09.8  11.9  14.7   Hematocrit 39.0 - 52.0 % 40.0  42.9  49.5   Platelets 150 - 400 K/uL  145  103        Latest Ref Rng & Units 09/25/2022    1:56 PM 09/25/2022   10:08 AM 07/12/2014    7:17 AM  CMP  Glucose 70 - 99 mg/dL  829  562   BUN 8 - 23 mg/dL  34  19   Creatinine 1.30 - 1.24 mg/dL  8.65  7.84   Sodium 696 - 145 mmol/L 138  135  140   Potassium 3.5 - 5.1 mmol/L 4.4  4.7  4.1   Chloride 98 - 111 mmol/L  109  109   CO2 22 - 32 mmol/L  12  23   Calcium 8.9 - 10.3 mg/dL  9.4  8.6   Total Protein 6.5 - 8.1 g/dL  6.7    Total Bilirubin 0.3 - 1.2 mg/dL  0.4    Alkaline Phos 38 - 126 U/L  44    AST 15 - 41 U/L  21    ALT 0 - 44 U/L  21      DG Chest 2 View  Result Date: 09/25/2022 CLINICAL DATA:  Chest pain. 3 stents placed 3 months ago. Shortness of breath, productive cough, recent onset of chest tightness. EXAM: CHEST - 2 VIEW COMPARISON:  Chest radiographs 07/02/2014 FINDINGS: Cardiac silhouette is at the upper limits of normal size. Mediastinal contours are within normal limits. The lungs are clear. No pleural effusion or pneumothorax. Mild multilevel degenerative disc changes of the mid to upper thoracic spine. IMPRESSION: No acute cardiopulmonary process. Electronically Signed   By: Neita Garnet M.D.   On: 09/25/2022 11:43     EKG: personally reviewed my interpretation is sinus rhythm with LBBB.  CXR: personally reviewed my interpretation is normal CXR.   Assessment & Plan by Problem:  Present on Admission:  Chest pain due to CAD [facility])  Chest Pain HFrEF Patient presented with sudden onset chest pain, dyspnea concerning for ACS. Initially was started on heparin but this discontinued cardiology when troponins were down-trending. Other possibilities considered included PE which was low given chronic AC but D-dimer was obtained and was normal. Other causes for chest pain could be MSK vs pericarditis vs HF vs anxiety. Pt did not  seem volume overloaded on exam and EKG not consistent with pericarditis but it still can be a possibility. Pt states majority of his pain has resolved except residual pleuritic chest pain which can be seen with pericarditis. Will obtain  echocardiogram to assess EF and any pericardial inflammation. Cardiology folowing, appreciate their assistance.  -Continue aspirin, plavix, and eliquis at home doses -Start Imdur per cardiology  AKI Noted on labwork. But given bicarb was inaccurate given normal on VBG, repeat BMP pending. Will follow up repeat Cr. Will hold nephrotoxic meds and renally dose medications.   Chronic HTN Pt states he takes all of his medications. He is on hydralazine and Coreg 12.5 mg BID. Pt had some transient low Bps, so will start with Coreg and IMDUR. If pt's BP remain elevated then we can add Entresto but will hold given AKI.   DMII A1c 7.3 in 09/2022. On farxiga 10 mg and Metformin 500 mg BID. Will hold oral meds given AKI and start SSI.   HLD Pt on aspirin, plavix, eliquis and lipitor 80 mg every day. Restarted home meds.    DVT prophx: Eliquis Diet: HH/Carb Bowel: PRN Code: Full  Prior to Admission Living Arrangement: Home Anticipated Discharge Location: Home Barriers to Discharge: Medical Workup  Dispo: Admit patient to Inpatient with expected length of stay greater than 2 midnights.  Gwenevere Abbot, MD Eligha Bridegroom. [facility] Internal Medicine Residency, PGY-3 Pager: [phone]

## 2022-09-25 NOTE — Progress Notes (Signed)
Initial plans were to give heparin in the interim until second troponin resulted. Second troponin shows decrease, not consistent with ACS. Will discontinue  heparin and resume home Eliquis.

## 2022-09-25 NOTE — ED Provider Notes (Signed)
[place] EMERGENCY DEPARTMENT AT [facility] Provider Note   CSN: [id number] Arrival date & time: 09/25/22  1914     History  No chief complaint on file.   Steven Brady is a 67 y.o. male.  Patient comes in for chest pain and shortness of breath that started suddenly this morning around 9 or 9:30 AM.  Patient notes he just got back from getting routine blood work and sat down, when he noticed his chest pain hit him suddenly, he was also having some shortness of breath.  Patient also notes he has had some dizziness related to his vertigo, that has been worse since chest pain is started.  Describes the chest pain as pressure on his chest, radiates to left arm, and up his neck.        Home Medications Prior to Admission medications   Medication Sig Start Date End Date Taking? Authorizing Provider  Accu-Chek Softclix Lancets lancets 1 each by Other route in the morning and at bedtime. 09/08/22 09/08/23  [provider]  apixaban (ELIQUIS) 5 MG TABS tablet Take 5 mg by mouth 2 (two) times daily.    [provider]  atorvastatin (LIPITOR) 40 MG tablet Take 40 mg by mouth daily. 03/04/14   [provider]  carvedilol (COREG) 12.5 MG tablet Take 1 tablet (12.5 mg total) by mouth 2 (two) times daily. 07/12/14   Vassie Loll, MD  Cholecalciferol (VITAMIN D3) 2000 UNITS TABS Take 2,000 Units by mouth daily. 07/31/14   Funches, Gerilyn Nestle, MD  clopidogrel (PLAVIX) 75 MG tablet Take 75 mg by mouth daily.    [provider]  dapagliflozin propanediol (FARXIGA) 10 MG TABS tablet Take 10 mg by mouth daily.    [provider]  furosemide (LASIX) 40 MG tablet Take 40 mg by mouth daily as needed for fluid or edema.    [provider]  glucose blood (ACCU-CHEK GUIDE) test strip 1 each by Other route as needed for other. 09/08/22 09/08/23  [provider]  hydrALAZINE (APRESOLINE) 25 MG tablet Take 1 tablet (25 mg total) by mouth  every 8 (eight) hours. Patient taking differently: Take 50 mg by mouth every 8 (eight) hours. 07/12/14   Vassie Loll, MD  meclizine (ANTIVERT) 25 MG tablet Take 1 tablet (25 mg total) by mouth 2 (two) times daily. 07/12/14   Vassie Loll, MD  metFORMIN (GLUCOPHAGE) 500 MG tablet Take 500 mg by mouth 2 (two) times daily with a meal. 09/08/22   [provider]  Multiple Vitamins-Minerals (CENTRUM ADULTS PO) Take 1 tablet by mouth daily.    [provider]  potassium chloride SA (KLOR-CON M) 20 MEQ tablet Take 20 mEq by mouth daily. Takes 2 tablets for a total of 40 meq    [provider]  sacubitril-valsartan (ENTRESTO) 97-103 MG Take 1 tablet by mouth 2 (two) times daily.    [provider]  spironolactone (ALDACTONE) 25 MG tablet Take 25 mg by mouth daily.    [provider]  UNABLE TO FIND Outpatient vestibular rehabilitation. Outpatient Physical therapy 07/12/14   Vassie Loll, MD      Allergies    Patient has no known allergies.    Review of Systems   Review of Systems  Constitutional:  Negative for fever and unexpected weight change.  Respiratory:  Positive for shortness of breath.   Cardiovascular:  Positive for chest pain.  Gastrointestinal:  Positive for abdominal pain and nausea. Negative for vomiting.  Musculoskeletal:  Positive for back pain.  Neurological:  Positive for light-headedness.    Physical Exam Updated Vital Signs There were no vitals taken for this visit. Physical Exam Constitutional:      General: He is in acute distress.     Appearance: He is ill-appearing.  Cardiovascular:     Rate and Rhythm: Normal rate and regular rhythm.     Pulses: Normal pulses.     Heart sounds: Normal heart sounds. No murmur heard.    No friction rub. No gallop.  Pulmonary:     Effort: Respiratory distress present.     Breath sounds: No wheezing, rhonchi or rales.  Chest:     Chest wall: No tenderness or edema.  Abdominal:      Palpations: Abdomen is soft.     Tenderness: There is abdominal tenderness.  Musculoskeletal:     Right lower leg: No tenderness. No edema.     Left lower leg: No tenderness. No edema.  Neurological:     Mental Status: He is alert.     ED Results / Procedures / Treatments   Labs (all labs ordered are listed, but only abnormal results are displayed) Labs Reviewed - No data to display  EKG None  Radiology No results found.  Procedures Procedures    Medications Ordered in ED Medications - No data to display  ED Course/ Medical Decision Making/ A&P Clinical Course as of 09/25/22 1234  Fri Sep 25, 2022  1103 Troponin return's elevated to 55, w/ repeat in 2 hours.  [BS]  1221 CXR negative, CBC w/ slighlly low PLT 145, Cr 1.55. , lipase normal. [BS]  1228 BNP elevated to 174.1. Cardiology was consulted for elevated troponin. [BS]    Clinical Course User Index [BS] Bess Kinds, MD                             Medical Decision Making Patient comes in for new onset chest pain, that started this morning suddenly.  Patient brought in by daughter via personal vehicle.  Patient notices that chest pain and shortness of breath began around 9 AM this morning, after getting routine labs.  Patient appreciates some nausea, no vomiting, also some lightheadedness, and diffuse abdominal tenderness.  Troponin noted to be elevated to 55, BNP noted to be slightly elevated to 174.1.  Given history, and physical exam, most concerned about ACS.  HCO3 noted to be 12. Cardiology following, will plan to admit.  Amount and/or Complexity of Data Reviewed External Data Reviewed: notes.    Details: Previous Cardiology notes Labs: ordered.    Details: CBC, CMP, BNP, Lipase Radiology: ordered.    Details: CXR ECG/medicine tests: ordered.    Details: LBBB noted, may be in previous EKG's as noted in past documentation. Discussion of management or test interpretation with external provider(s):  Discussed elevated troponin w/ Cardiology. Plans to see him/evaluate.  Risk OTC drugs.           Final Clinical Impression(s) / ED Diagnoses Final diagnoses:  None    Rx / DC Orders ED Discharge Orders     None         Bess Kinds, MD 09/25/22 1315    Alvira Monday, MD 09/25/22 2336

## 2022-09-25 NOTE — ED Notes (Signed)
ED TO INPATIENT HANDOFF REPORT  ED Nurse Name and Phone #:[phone]  S Name/Age/Gender Steven Brady 67 y.o. male Room/Bed: 046C/046C  Code Status   Code Status: Full Code  Home/SNF/Other Home Patient oriented to: self, place, time, and situation Is this baseline? Yes   Triage Complete: Triage complete  Chief Complaint Chest pain due to CAD [facility]) [I25.119]  Triage Note Pt to the ed form getting blood work with a CC of chest pain with sob x 1 day. Pt relays he has a lot of pressure on his chest. Pt relays he had stents placed last month and has a hx of stroke. Pt denies LOC, dizziness.    Allergies No Known Allergies  Level of Care/Admitting Diagnosis ED Disposition     ED Disposition  Admit   Condition  --   Comment  Hospital Area: [facility] [[id number]]  Level of Care: Telemetry Medical [104]  May admit patient to [facility] or [facility] if equivalent level of care is available:: Yes  Covid Evaluation: Asymptomatic - no recent exposure (last 10 days) testing not required  Diagnosis: Chest pain due to CAD [facility]) [[id number]]  Admitting Physician: Inez Catalina [[id number]]  Attending Physician: Inez Catalina [phone]  Certification:: I certify this patient will need inpatient services for at least 2 midnights  Estimated Length of Stay: 2          B Medical/Surgery History 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   recently hurt left ankle - 2 weeks ago   CHF (congestive heart failure) (HCC) 2015   Hematuria - cause not known 06/08/11   pt has been seeing blood off and on in urine   Hyperlipidemia Dx 2015   Hypertension Dx 2015   Tobacco abuse    since age of 66   Past Surgical History:  Procedure Laterality Date   NO PAST SURGERIES       A IV Location/Drains/Wounds Patient Lines/Drains/Airways Status     Active Line/Drains/Airways     None            Intake/Output Last 24 hours No intake or  output data in the 24 hours ending 09/25/22 1700  Labs/Imaging Results for orders placed or performed during the hospital encounter of 09/25/22 (from the past 48 hour(s))  Troponin I (High Sensitivity)     Status: Abnormal   Collection Time: 09/25/22 10:08 AM  Result Value Ref Range   Troponin I (High Sensitivity) 55 (H) <18 ng/L    Comment: (NOTE) Elevated high sensitivity troponin I (hsTnI) values and significant  changes across serial measurements may suggest ACS but many other  chronic and acute conditions are known to elevate hsTnI results.  Refer to the "Links" section for chest pain algorithms and additional  guidance. Performed at [facility], [address] [place], [place] [address]   CBC with Differential     Status: Abnormal   Collection Time: 09/25/22 10:08 AM  Result Value Ref Range   WBC 6.1 4.0 - 10.5 K/uL   RBC 4.63 4.22 - 5.81 MIL/uL   Hemoglobin 13.8 13.0 - 17.0 g/dL   HCT 57.8 46.9 - 62.9 %   MCV 92.7 80.0 - 100.0 fL   MCH 29.8 26.0 - 34.0 pg   MCHC 32.2 30.0 - 36.0 g/dL   RDW 52.8 41.3 - 24.4 %   Platelets 145 (L) 150 - 400 K/uL    Comment: REPEATED TO VERIFY  nRBC 0.0 0.0 - 0.2 %   Neutrophils Relative % 60 %   Neutro Abs 3.7 1.7 - 7.7 K/uL   Lymphocytes Relative 29 %   Lymphs Abs 1.8 0.7 - 4.0 K/uL   Monocytes Relative 10 %   Monocytes Absolute 0.6 0.1 - 1.0 K/uL   Eosinophils Relative 0 %   Eosinophils Absolute 0.0 0.0 - 0.5 K/uL   Basophils Relative 1 %   Basophils Absolute 0.0 0.0 - 0.1 K/uL   Immature Granulocytes 0 %   Abs Immature Granulocytes 0.01 0.00 - 0.07 K/uL    Comment: Performed at [facility], [address] [place], [place] [address]  Comprehensive metabolic panel     Status: Abnormal   Collection Time: 09/25/22 10:08 AM  Result Value Ref Range   Sodium 135 135 - 145 mmol/L   Potassium 4.7 3.5 - 5.1 mmol/L   Chloride 109 98 - 111 mmol/L   CO2 12 (L) 22 - 32 mmol/L   Glucose, Bld 105 (H) 70 - 99 mg/dL     Comment: Glucose reference range applies only to samples taken after fasting for at least 8 hours.   BUN 34 (H) 8 - 23 mg/dL   Creatinine, Ser 6.04 (H) 0.61 - 1.24 mg/dL   Calcium 9.4 8.9 - 54.0 mg/dL   Total Protein 6.7 6.5 - 8.1 g/dL   Albumin 3.7 3.5 - 5.0 g/dL   AST 21 15 - 41 U/L   ALT 21 0 - 44 U/L   Alkaline Phosphatase 44 38 - 126 U/L   Total Bilirubin 0.4 0.3 - 1.2 mg/dL   GFR, Estimated 49 (L) >60 mL/min    Comment: (NOTE) Calculated using the CKD-EPI Creatinine Equation (2021)    Anion gap 14 5 - 15    Comment: Performed at [facility], [address], [place], [place] [address]  Lipase, blood     Status: None   Collection Time: 09/25/22 10:08 AM  Result Value Ref Range   Lipase 35 11 - 51 U/L    Comment: Performed at [facility], [address], [place], [place] [address]  Brain natriuretic peptide     Status: Abnormal   Collection Time: 09/25/22 10:08 AM  Result Value Ref Range   B Natriuretic Peptide 174.1 (H) 0.0 - 100.0 pg/mL    Comment: Performed at [facility], [address] [place], [place] [address]  D-dimer, quantitative     Status: None   Collection Time: 09/25/22 10:09 AM  Result Value Ref Range   D-Dimer, Quant <0.27 0.00 - 0.50 ug/mL-FEU    Comment: (NOTE) At the manufacturer cut-off value of 0.5 g/mL FEU, this assay has a negative predictive value of 95-100%.This assay is intended for use in conjunction with a clinical pretest probability (PTP) assessment model to exclude pulmonary embolism (PE) and deep venous thrombosis (DVT) in outpatients suspected of PE or DVT. Results should be correlated with clinical presentation. Performed at [facility], [address], [place], [place] [address]   Troponin I (High Sensitivity)     Status: Abnormal   Collection Time: 09/25/22 12:53 PM  Result Value Ref Range   Troponin I (High Sensitivity) 39 (H) <18 ng/L    Comment: (NOTE) Elevated high sensitivity troponin I (hsTnI)  values and significant  changes across serial measurements may suggest ACS but many other  chronic and acute conditions are known to elevate hsTnI results.  Refer to the "  Links" section for chest pain algorithms and additional  guidance. Performed at [facility], [address], [place], [place] [address]   Lactic acid, plasma     Status: None   Collection Time: 09/25/22  1:07 PM  Result Value Ref Range   Lactic Acid, Venous 1.0 0.5 - 1.9 mmol/L    Comment: Performed at [facility], [address] [place], [place] [address]  I-Stat venous blood gas, [facility] ED, [facility], [facility])     Status: Abnormal   Collection Time: 09/25/22  1:56 PM  Result Value Ref Range   pH, Ven 7.498 (H) 7.25 - 7.43   pCO2, Ven 24.7 (L) 44 - 60 mmHg   pO2, Ven 87 (H) 32 - 45 mmHg   Bicarbonate 19.2 (L) 20.0 - 28.0 mmol/L   TCO2 20 (L) 22 - 32 mmol/L   O2 Saturation 98 %   Acid-base deficit 2.0 0.0 - 2.0 mmol/L   Sodium 138 135 - 145 mmol/L   Potassium 4.4 3.5 - 5.1 mmol/L   Calcium, Ion 1.12 (L) 1.15 - 1.40 mmol/L   HCT 40.0 39.0 - 52.0 %   Hemoglobin 13.6 13.0 - 17.0 g/dL   Sample type VENOUS    DG Chest 2 View  Result Date: 09/25/2022 CLINICAL DATA:  Chest pain. 3 stents placed 3 months ago. Shortness of breath, productive cough, recent onset of chest tightness. EXAM: CHEST - 2 VIEW COMPARISON:  Chest radiographs 07/02/2014 FINDINGS: Cardiac silhouette is at the upper limits of normal size. Mediastinal contours are within normal limits. The lungs are clear. No pleural effusion or pneumothorax. Mild multilevel degenerative disc changes of the mid to upper thoracic spine. IMPRESSION: No acute cardiopulmonary process. Electronically Signed   By: Neita Garnet M.D.   On: 09/25/2022 11:43    Pending Labs Unresulted Labs (From admission, onward)     Start     Ordered   09/25/22 1600  HIV Antibody (routine testing w rflx)  Once,   R        09/25/22 1600   09/25/22 1501  TSH  Once,   R        09/25/22  1502   09/25/22 1501  Rapid urine drug screen (hospital performed)  ONCE - STAT,   STAT        09/25/22 1502   09/25/22 1430  Basic metabolic panel  Once,   STAT        09/25/22 1430   09/25/22 1307  Lactic acid, plasma  Now then every 2 hours,   R      09/25/22 1306   09/25/22 1306  Blood gas, venous  ONCE - STAT,   STAT        09/25/22 1306            Vitals/Pain Today's Vitals   09/25/22 1456 09/25/22 1515 09/25/22 1642 09/25/22 1643  BP: (!) 127/93 99/80    Pulse: 70 73    Resp: 16 20    Temp:      TempSrc:      SpO2: 98% 99%    Weight:      Height:      PainSc:   4  4     Isolation Precautions No active isolations  Medications Medications  acetaminophen (TYLENOL) tablet 650 mg (has no administration in time range)    Or  acetaminophen (TYLENOL) suppository 650 mg (has no administration in time range)  senna-docusate (Senokot-S) tablet 1 tablet (has  no administration in time range)  clopidogrel (PLAVIX) tablet 75 mg (has no administration in time range)  atorvastatin (LIPITOR) tablet 80 mg (has no administration in time range)  aspirin chewable tablet 324 mg (324 mg Oral Given 09/25/22 1153)    Mobility walks with device     Focused Assessments Cardiac Assessment Handoff:    Lab Results  Component Value Date   CKTOTAL 217 06/09/2011   CKMB 3.5 06/09/2011   TROPONINI <0.03 07/10/2014   Lab Results  Component Value Date   DDIMER <0.27 09/25/2022   Does the Patient currently have chest pain? Yes    R Recommendations: See Admitting Provider Note  Report given to:   Additional Notes: patient came in with chest pain and shortness of breath. He said pain is in mid chest and it's a 4. Saturation 98% on RA. Patient normally ambulate with a cane at home. Cardiology is following, because first Troponin was 55 and the second is 39. Can you read the cardiologist note.

## 2022-09-25 NOTE — Consult Note (Addendum)
Cardiology Consultation   Patient ID: Steven Brady MRN: [id number]; DOB: Nov 01, 1955  Admit date: 09/25/2022 Date of Consult: 09/25/2022  PCP:  Oneita Hurt No    HeartCare Providers Cardiologist:  None [facility]   Patient Profile:   Steven Brady is a 67 y.o. male with a hx of chronic HFrEF, LBBB, ischemic CVA 2022, CKD thrombocytopenia who is being seen 09/25/2022 for the evaluation of pain at the request of Dr. Dalene Seltzer.  History of Present Illness:   [name] is currently followed by [facility] for chronic HFrEF of at least 10+ years of unknown etiology.  Patient also has hx of stroke in October of 2022 with LVEF at that time being 11%.  Later on patient had coronary CTA for further evaluation of cardiomyopathy and noted multivessel CAD that were initially planned medically, but then underwent cardiac MRI which suggested prior infarct involving the inferior wall and had complaints of progressive dyspnea on exertion but no chest pain.  LVEF was 38%. He was seen last in July 13, 2022 for cardiac catheterization due to these abnormal workups.Cath showed minimal LAD disease. Marginal 35%. PDA 30%. Distal circumflex artery 70% stenosis. The proximal mid and RCA had a 70% stenosis with positive FFR. Stents were placed to proximal RCA, mid RCA, distal RCA.  Overall procedure with no complication and 0% residual stenosis postintervention. Patient was placed on triple therapy with Eliquis, Plavix and aspirin for 2 weeks.  After 14 days of aspirin was discontinued  Patient currently is in cardiac rehab and has been going for at least a month.  Reports compliancy with all of his medications and takes them religiously.  Overall heart failure appears to be stable without any prior hospitalizations or issues with volume.  Patient is now presenting with one-time occurrence of nonexertional chest pain that started this morning once he sat down after walking up some steps.  He  states that once he sat down moments later he started feeling an abrupt onset of shortness of breath and chest heaviness.  Chest pain persisted and has been ongoing however improved now.  Chest pain is reproducible when taking deep inhalations, he has some radiation to his neck and shoulder however chronically has neck and shoulder pain.  Also describes some neuropathy like symptoms that he feels throughout his entire chest.  He has not had symptoms like this throughout cardiac rehab.  Patient and daughter also feel like there might be an element of anxiety with this.  EKG shows chronic left bundle branch block.  No acute ST-T wave changes. First trop 55.  Chest x-ray negative.  BNP 174. 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   recently hurt left ankle - 2 weeks ago   CHF (congestive heart failure) (HCC) 2015   Hematuria - cause not known 06/08/11   pt has been seeing blood off and on in urine   Hyperlipidemia Dx 2015   Hypertension Dx 2015   Tobacco abuse    since age of 89    Past Surgical History:  Procedure Laterality Date   NO PAST SURGERIES       Inpatient Medications: Scheduled Meds:  Continuous Infusions:  PRN Meds:   Allergies:   No Known Allergies  Social History:   Social History   Socioeconomic History   Marital status: Married    Spouse name: Not on file   Number of children: Not on file   Years of education: Not on file  Highest education level: Not on file  Occupational History   Not on file  Tobacco Use   Smoking status: Light Smoker    Packs/day: .25    Types: Cigarettes   Smokeless tobacco: Never  Substance and Sexual Activity   Alcohol use: No    Alcohol/week: 0.0 standard drinks of alcohol   Drug use: Yes    Types: Marijuana   Sexual activity: Not on file  Other Topics Concern   Not on file  Social History Narrative   Not on file   Social Determinants of Health   Financial Resource Strain: Not on file  Food  Insecurity: Not on file  Transportation Needs: Not on file  Physical Activity: Not on file  Stress: Not on file  Social Connections: Not on file  Intimate Partner Violence: Not on file    Family History:   Family History  Problem Relation Age of Onset   Dementia Mother    Dementia Father      ROS:  Please see the history of present illness.  All other ROS reviewed and negative.     Physical Exam/Data:   Vitals:   09/25/22 1003 09/25/22 1014 09/25/22 1100  BP: 104/74  103/79  Pulse: 70  65  Resp: (!) 28  20  Temp: 98.5 F (36.9 C)    TempSrc: Oral    SpO2: 100%  99%  Weight:  95 kg   Height:  5\' 8"  (1.727 m)    No intake or output data in the 24 hours ending 09/25/22 1247    09/25/2022   10:14 AM 08/11/2022    9:50 AM 07/30/2014    9:19 AM  Last 3 Weights  Weight (lbs) 209 lb 7 oz 209 lb 8 oz 183 lb  Weight (kg) 95 kg 95.029 kg 83.008 kg     Body mass index is 31.84 kg/m.  General:  Well nourished, well developed, in no acute distress HEENT: normal Neck: no JVD Vascular: No carotid bruits; Distal pulses 2+ bilaterally Cardiac:  normal S1, S2; RRR; no murmur  Lungs:  clear to auscultation bilaterally, no wheezing, rhonchi or rales  Abd: soft, nontender, no hepatomegaly  Ext: no edema Musculoskeletal:  No deformities, BUE and BLE strength normal and equal Skin: warm and dry  Neuro:  CNs 2-12 intact, no focal abnormalities noted Psych:  Normal affect   EKG:  The EKG was personally reviewed and demonstrates:  EKG shows normal sinus rhythm, left bundle branch block.  Inferolateral T wave inversions that are chronic. Telemetry:  Telemetry was personally reviewed and demonstrates: Normal sinus rhythm heart rate 60s  Relevant CV Studies: Cardiac catheterization 07/19/2022 Coronary Findings Diagnostic Dominance: Right  Left Anterior Descending: The vessel exhibits minimal luminal irregularities.  Left Circumflex: The vessel exhibits minimal luminal irregularities. Dist  Cx lesion is 70% stenosed. Third Obtuse Marginal Branch: 3rd Mrg lesion is 35% stenosed.  Right Coronary Artery: Ost RCA to Prox RCA lesion is 30% stenosed. Prox RCA lesion is 70% stenosed. Lesion length: 13 mm. TIMI flow is 3. The lesion is not complex (non high-C). Mid RCA lesion is 70% stenosed. Lesion length: 15 mm. TIMI flow is 3. The lesion is not complex (non high-C). Dist RCA lesion is 70% stenosed. Lesion length: 20 mm. TIMI flow is 3. The lesion is not complex (non high-C). Pressure wire/FFR was performed on the lesion. FFR: 0.79. Right Posterior Descending Artery: RPDA lesion is 30% stenosed.   Intervention  Prox RCA lesion: Stent: Drug-eluting  stent was successfully placed. Stent was deployed by way of balloon expansion. Post-Intervention Lesion Assessment: The intervention was successful. The guidewire crossed the lesion. Device was deployed. Post-intervention TIMI flow is 3. Lesion had 15 mm of its length treated. There were no complications. There is a 0% residual stenosis post intervention.  Mid RCA lesion: Stent: Drug-eluting stent was successfully placed. Stent was deployed by way of balloon expansion. Post-Intervention Lesion Assessment: The intervention was successful. The guidewire crossed the lesion. Device was deployed. Post-intervention TIMI flow is 3. Lesion had 15 mm of its length treated. There were no complications. There is a 0% residual stenosis post intervention.  Dist RCA lesion: Angioplasty: Angioplasty using a non-compliant balloon was performed prior to stent deployment. Single inflation was performed. Maximum pressure: 7 atm. Inflation time: 15 sec. Supplies Used: CATHETER BALLOON ANGIOPLASTY [place] QUANTUM APEX MONORAIL POST-DILAT X 143CM RX NON-COMPLIANT Stent: Drug-eluting stent was successfully placed. Stent was deployed by way of balloon expansion. Multiple inflations were performed. Maximum pressure: 17 atm. Inflation time: 20 sec. Supplies Used: SYSTEM STENT  CORONARY DRUG ELUTING XIENCE SKYPOINT 4.0MM 145CM RX EVEROLIMUS COCR COPOLYMER Post-Intervention Lesion Assessment: The intervention was successful. The guidewire crossed the lesion. Device was deployed. Post-intervention TIMI flow is 3. Lesion had 23 mm of its length treated. There were no complications. There is a 0% residual stenosis post intervention.   Left Ventricle  Left ventriculography was not performed   Left Heart Pressures  LV systolic pressure is normal. LV end diastolic pressure is normal.   Cardiac MRI 06/22/2022 -Quantitative LVEF 38%. Hypertrophy of the basal to mid septum  12-13 mm .  LV cavity is mildly enlarged  LVEDVI 113 ml-m2 .  LV systolic function is moderately decreased globally--in large part due to LV dyssynchrony  LBBB .   -Quantitative RVEF 80%. RV cavity size is normal  RVEDVI 59 ml-m2 . RV systolic function is normal.   -LGE  1. Diffuse, patchy non-CAD scarring of the left ventricle, in particular, the lateral wall.   2. Small focal infarction of the mid inferior-inferoseptal wall  PDA distribution of the RCA .   The LV morphology  hypertrophy  and diffuse, patchy non-CAD scarring of the left ventricle can be  consistent with advanced hypertensive heart disease or a genetic cardiomyopathy.  Recommend obtaining cardiomyopathy genetic testing.   Laboratory Data:  High Sensitivity Troponin:   Recent Labs  Lab 09/25/22 1008  TROPONINIHS 55*     Chemistry Recent Labs  Lab 09/25/22 1008  NA 135  K 4.7  CL 109  CO2 12*  GLUCOSE 105*  BUN 34*  CREATININE 1.55*  CALCIUM 9.4  GFRNONAA 49*  ANIONGAP 14    Recent Labs  Lab 09/25/22 1008  PROT 6.7  ALBUMIN 3.7  AST 21  ALT 21  ALKPHOS 44  BILITOT 0.4   Lipids No results for input(s): "CHOL", "TRIG", "HDL", "LABVLDL", "LDLCALC", "CHOLHDL" in the last 168 hours.  Hematology Recent Labs  Lab 09/25/22 1008  WBC 6.1  RBC 4.63  HGB 13.8  HCT 42.9  MCV 92.7  MCH 29.8  MCHC 32.2  RDW  13.5  PLT 145*   Thyroid No results for input(s): "TSH", "FREET4" in the last 168 hours.  BNP Recent Labs  Lab 09/25/22 1008  BNP 174.1*    DDimer No results for input(s): "DDIMER" in the last 168 hours.   Radiology/Studies:  DG Chest 2 View  Result Date: 09/25/2022 CLINICAL DATA:  Chest pain.  3 stents placed 3 months ago. Shortness of breath, productive cough, recent onset of chest tightness. EXAM: CHEST - 2 VIEW COMPARISON:  Chest radiographs 07/02/2014 FINDINGS: Cardiac silhouette is at the upper limits of normal size. Mediastinal contours are within normal limits. The lungs are clear. No pleural effusion or pneumothorax. Mild multilevel degenerative disc changes of the mid to upper thoracic spine. IMPRESSION: No acute cardiopulmonary process. Electronically Signed   By: Neita Garnet M.D.   On: 09/25/2022 11:43     Assessment and Plan:   Chest pain CAD status post PCI to RCA x 06/2022 (Minimal LAD disease. Marginal 35%. PDA 30%. Distal circumflex artery 70% stenosis. The proximal mid and distal RCA had a 70% stenosis with positive FFR. S/p prox, mid, distal RCA stents)  Patient presenting with first-time occurrence of chest pain/SOB since cardiac catheterization.  Symptoms sound somewhat mixed of atypical/typical.  States that he has chest pain with deep inhalation and has diffuse neuropathy-like sensation throughout his chest.  This chest pain was nonexertional.  EKG does not show any acute ST-T wave changes.  First troponin mildly elevated in the 50s.  Given this is first-time occurrence and slight elevation of troponins do not think that cardiac catheterization is warranted right now.  Plan to continue to monitor symptoms and will wait for next troponins, but will place patient on heparin in the interim.   Will start IV heparin.  Will stop Eliquis Will give 1 dose of sublingual nitroglycerin.  Depending on how he tolerates this may consider Imdur. Continue Plavix, statin,  beta-blocker.  Chronic HFrEF Overall stable.  Euvolemic on exam with mildly elevated BNP in the 100s.  LVEF 38% on cardiac MRI in April 2024. Continue Entresto 97-103 mg twice daily, carvedilol 25 mg twice daily, spironolactone 25 mg, hydralazine 50 mg 3 times daily, Farxiga 10 mg daily Was taking Lasix outpatient 20 mg daily.  Do not think this is needed now. Prior notes indicate patient had plans to see EP for consideration of CRT-D  Hyperlipidemia Continue atorvastatin 80 mg daily  Hypertension Well-controlled on medications as above.  CVA Chronically on Eliquis 5 mg twice daily.  Has had prior cardiac monitor with no detections of atrial fibrillation.  Will stop this to place on heparin in the interim.   Risk Assessment/Risk Scores:  New York Heart Association (NYHA) Functional Class NYHA Class II    For questions or updates, please contact [facility] Please consult [email or web address] for contact info under    Signed, Abagail Kitchens, PA-C  09/25/2022 12:47 PM   I have personally seen and examined this patient. I agree with the assessment and plan as outlined above. 66 yo male with history of chronic systolic CHF, LBBB, CKD, prior stroke and CAD followed in [facility] who is here today with dyspnea and chest pain. He had been doing well at home following PCI of the RCA in April 2024. Sudden onset of chest pressure and dyspnea today when climbing stairs. Troponin 55. EKG with sinus with LBBB. Chest pain is improved. No volume overload on exam. Labs reviewed by me EKG reviewed by me see above My exam: NAD, CV:RRR, Lungs: clear bilat Abd: soft, NT. Ext: no LE edema Plan: CAD with chest pain/angina with elevated troponin. Would cycle troponin. Gently hydrate given renal insufficiency. I would start IV heparin given the elevated troponin. Hold Eliquis. Will add Imdur. Based on troponin trend and recurrence of chest pain, may consider cath next week if renal function  allows. If troponin is flat and chest pain does not recur, may not need a cath. We will follow with you.  Verne Carrow, MD, [facility] 09/25/2022 1:59 PM

## 2022-09-26 ENCOUNTER — Inpatient Hospital Stay (HOSPITAL_COMMUNITY): Payer: No Typology Code available for payment source

## 2022-09-26 DIAGNOSIS — R079 Chest pain, unspecified: Secondary | ICD-10-CM | POA: Diagnosis not present

## 2022-09-26 DIAGNOSIS — I25119 Atherosclerotic heart disease of native coronary artery with unspecified angina pectoris: Secondary | ICD-10-CM

## 2022-09-26 DIAGNOSIS — F17210 Nicotine dependence, cigarettes, uncomplicated: Secondary | ICD-10-CM | POA: Diagnosis not present

## 2022-09-26 DIAGNOSIS — N179 Acute kidney failure, unspecified: Secondary | ICD-10-CM | POA: Diagnosis not present

## 2022-09-26 DIAGNOSIS — I639 Cerebral infarction, unspecified: Secondary | ICD-10-CM | POA: Insufficient documentation

## 2022-09-26 LAB — ECHOCARDIOGRAM COMPLETE
AR max vel: 3.39 cm2
AV Peak grad: 8.2 mmHg
Ao pk vel: 1.43 m/s
Area-P 1/2: 5.95 cm2
Height: 69 in
S' Lateral: 3.8 cm
Weight: 3149.93 oz

## 2022-09-26 LAB — CBC
HCT: 40.3 % (ref 39.0–52.0)
Hemoglobin: 13.2 g/dL (ref 13.0–17.0)
MCH: 30.1 pg (ref 26.0–34.0)
MCHC: 32.8 g/dL (ref 30.0–36.0)
MCV: 91.8 fL (ref 80.0–100.0)
Platelets: 126 10*3/uL — ABNORMAL LOW (ref 150–400)
RBC: 4.39 MIL/uL (ref 4.22–5.81)
RDW: 13.4 % (ref 11.5–15.5)
WBC: 5.5 10*3/uL (ref 4.0–10.5)
nRBC: 0 % (ref 0.0–0.2)

## 2022-09-26 LAB — RENAL FUNCTION PANEL
Albumin: 3.5 g/dL (ref 3.5–5.0)
Anion gap: 8 (ref 5–15)
BUN: 30 mg/dL — ABNORMAL HIGH (ref 8–23)
CO2: 23 mmol/L (ref 22–32)
Calcium: 9.3 mg/dL (ref 8.9–10.3)
Chloride: 107 mmol/L (ref 98–111)
Creatinine, Ser: 1.45 mg/dL — ABNORMAL HIGH (ref 0.61–1.24)
GFR, Estimated: 53 mL/min — ABNORMAL LOW (ref >60)
Glucose, Bld: 131 mg/dL — ABNORMAL HIGH (ref 70–99)
Phosphorus: 4 mg/dL (ref 2.5–4.6)
Potassium: 4.7 mmol/L (ref 3.5–5.1)
Sodium: 138 mmol/L (ref 135–145)

## 2022-09-26 LAB — BLOOD GAS, ARTERIAL
Acid-base deficit: 3 mmol/L — ABNORMAL HIGH (ref 0.0–2.0)
Bicarbonate: 21.1 mmol/L (ref 20.0–28.0)
Drawn by: 164
O2 Saturation: 99.4 %
Patient temperature: 37
pCO2 arterial: 34 mmHg (ref 32–48)
pH, Arterial: 7.4 (ref 7.35–7.45)
pO2, Arterial: 102 mmHg (ref 83–108)

## 2022-09-26 MED ORDER — NITROGLYCERIN 0.3 MG SL SUBL: 0.3000 mg | SUBLINGUAL_TABLET | SUBLINGUAL | 0 refills | Status: AC | PRN

## 2022-09-26 MED ORDER — ATORVASTATIN CALCIUM 80 MG PO TABS: 80.0000 mg | ORAL_TABLET | Freq: Every day | ORAL | 0 refills | Status: DC

## 2022-09-26 NOTE — Progress Notes (Signed)
  Echocardiogram 2D Echocardiogram has been performed.  Steven Brady 09/26/2022, 1:36 PM

## 2022-09-26 NOTE — Progress Notes (Signed)
ABG collected and sent to lab at this time 

## 2022-09-26 NOTE — Progress Notes (Signed)
Nsg Discharge Note  Admit Date:  09/25/2022 Discharge date: 09/26/2022   Aftab Nesseth to be D/C'd Home per MD order.  AVS completed.   Patient/caregiver able to verbalize understanding.  Discharge Medication: Allergies as of 09/26/2022   No Known Allergies      Medication List     STOP taking these medications    potassium chloride 20 MEQ packet Commonly known as: KLOR-CON       TAKE these medications    apixaban 5 MG Tabs tablet Commonly known as: ELIQUIS Take 5 mg by mouth 2 (two) times daily.   atorvastatin 80 MG tablet Commonly known as: LIPITOR Take 1 tablet (80 mg total) by mouth daily. What changed:  medication strength how much to take   carvedilol 12.5 MG tablet Commonly known as: COREG Take 1 tablet (12.5 mg total) by mouth 2 (two) times daily.   clopidogrel 75 MG tablet Commonly known as: PLAVIX Take 75 mg by mouth daily.   Entresto 97-103 MG Generic drug: sacubitril-valsartan Take 1 tablet by mouth 2 (two) times daily.   Farxiga 10 MG Tabs tablet Generic drug: dapagliflozin propanediol Take 10 mg by mouth daily.   furosemide 20 MG tablet Commonly known as: LASIX Take 20 mg by mouth as needed for fluid or edema. What changed: Another medication with the same name was removed. Continue taking this medication, and follow the directions you see here.   hydrALAZINE 50 MG tablet Commonly known as: APRESOLINE Take 50 mg by mouth in the morning and at bedtime. What changed: Another medication with the same name was changed. Make sure you understand how and when to take each.   hydrALAZINE 25 MG tablet Commonly known as: APRESOLINE Take 1 tablet (25 mg total) by mouth every 8 (eight) hours. What changed: when to take this   meclizine 25 MG tablet Commonly known as: ANTIVERT Take 1 tablet (25 mg total) by mouth 2 (two) times daily. What changed:  when to take this reasons to take this   metFORMIN 500 MG tablet Commonly known as:  GLUCOPHAGE Take 500 mg by mouth 2 (two) times daily with a meal.   multivitamin with minerals tablet Take 1 tablet by mouth daily.   nitroGLYCERIN 0.3 MG SL tablet Commonly known as: Nitrostat Place 1 tablet (0.3 mg total) under the tongue every 5 (five) minutes as needed for chest pain (Only take medication if you have chest pain. Please go to an emergency department if you have chest pain).   spironolactone 25 MG tablet Commonly known as: ALDACTONE Take 25 mg by mouth daily.   Vitamin D3 50 MCG (2000 UT) Tabs Take 2,000 Units by mouth daily.        Discharge Assessment: Vitals:   09/26/22 0606 09/26/22 1129  BP: 116/81 126/82  Pulse: 99 78  Resp:    Temp:  97.6 F (36.4 C)  SpO2:  96%   Skin clean, dry and intact without evidence of skin break down, no evidence of skin tears noted. IV catheter discontinued intact. Site without signs and symptoms of complications - no redness or edema noted at insertion site, patient denies c/o pain - only slight tenderness at site.  Dressing with slight pressure applied.  D/c Instructions-Education: Discharge instructions given to patient/family with verbalized understanding. D/c education completed with patient/family including follow up instructions, medication list, d/c activities limitations if indicated, with other d/c instructions as indicated by MD - patient able to verbalize understanding, all questions fully answered. Patient instructed  to return to ED, call 911, or call MD for any changes in condition.  Patient escorted via WC, and D/C home via private auto.  Kizzie Bane, RN 09/26/2022 2:28 PM

## 2022-09-26 NOTE — Discharge Summary (Signed)
Name: Steven Brady MRN: [id number] DOB: Jan 22, 1956 67 y.o. PCP: Pcp, No  Date of Admission: 09/25/2022  9:55 AM Date of Discharge: 09/26/2022 8:42 PM Attending Physician: Dr. Oswaldo Done  Discharge Diagnosis: Principal Problem:   Chest pain due to CAD [facility]) Active Problems:   CVA (cerebral vascular accident) [facility])    Discharge Medications: Allergies as of 09/26/2022   No Known Allergies      Medication List     STOP taking these medications    potassium chloride 20 MEQ packet Commonly known as: KLOR-CON       TAKE these medications    apixaban 5 MG Tabs tablet Commonly known as: ELIQUIS Take 5 mg by mouth 2 (two) times daily.   atorvastatin 80 MG tablet Commonly known as: LIPITOR Take 1 tablet (80 mg total) by mouth daily. What changed:  medication strength how much to take   carvedilol 12.5 MG tablet Commonly known as: COREG Take 1 tablet (12.5 mg total) by mouth 2 (two) times daily.   clopidogrel 75 MG tablet Commonly known as: PLAVIX Take 75 mg by mouth daily.   Entresto 97-103 MG Generic drug: sacubitril-valsartan Take 1 tablet by mouth 2 (two) times daily.   Farxiga 10 MG Tabs tablet Generic drug: dapagliflozin propanediol Take 10 mg by mouth daily.   furosemide 20 MG tablet Commonly known as: LASIX Take 20 mg by mouth as needed for fluid or edema. What changed: Another medication with the same name was removed. Continue taking this medication, and follow the directions you see here.   hydrALAZINE 50 MG tablet Commonly known as: APRESOLINE Take 50 mg by mouth in the morning and at bedtime. What changed: Another medication with the same name was changed. Make sure you understand how and when to take each.   hydrALAZINE 25 MG tablet Commonly known as: APRESOLINE Take 1 tablet (25 mg total) by mouth every 8 (eight) hours. What changed: when to take this   meclizine 25 MG tablet Commonly known as: ANTIVERT Take 1 tablet (25 mg total) by  mouth 2 (two) times daily. What changed:  when to take this reasons to take this   metFORMIN 500 MG tablet Commonly known as: GLUCOPHAGE Take 500 mg by mouth 2 (two) times daily with a meal.   multivitamin with minerals tablet Take 1 tablet by mouth daily.   nitroGLYCERIN 0.3 MG SL tablet Commonly known as: Nitrostat Place 1 tablet (0.3 mg total) under the tongue every 5 (five) minutes as needed for chest pain (Only take medication if you have chest pain. Please go to an emergency department if you have chest pain).   spironolactone 25 MG tablet Commonly known as: ALDACTONE Take 25 mg by mouth daily.   Vitamin D3 50 MCG (2000 UT) Tabs Take 2,000 Units by mouth daily.        Disposition and follow-up:   Mr.Steven Brady was discharged from [facility] in Stable condition.  At the hospital follow up visit please address:  1.  Follow-up:  *Chest Pain -Please ensure adherence to Nitroglycerin prn for chest pain -Continue atorvastatin at 80mg   -Ensure patient has received updated echocardiogram outpatient -Please coordinate with cardiologist for ongoing future care  -Please ensure adherence to HFrEF medications continued regimen as prescribed prior   *Acute Kidney Injury -Appears baseline Cr is around 1.14-1.20  -Initial Cr was 1.57 and decreased to 1.47 -Please follow up with BMP in 10-14 days -Please limit nephrotoxic agents  -Ensure patient has discontinued  potassium from discharge    2.  Labs / imaging needed at time of follow-up: BMP to reasses the AKI, CBC to assess platelets, echocardiogram out patient   3.  Pending labs/ test needing follow-up: N/A  4.  Medication Changes  STOPPED  -Potassium chloride   ADDED  -Nitroglycerine 0.3mg     MODIFIED  -Atorvastatin 80mg , increased from 40mg    Follow-up Appointments: Please make an appointment with your PCP Please follow up with cardiologist Please sure you have an  echocardiogram appointment scheduled with your cardiologist.   Hospital Course by problem list:  #Chest Pain The patient presented to the emergency department with chest pain that was sudden onset and described as primarily pleuritic.  Due to patient's cardiac history, he received a chest X-ray which showed no acute cardiopulmonary process, an EKG that did not show acute ST or T wave changes. Initial troponins were elevated and heparin was started and the repeat troponin's decreased therefore heparin was discontinued. He does not endorse chest pain or shortness of breath this morning. He will be getting an echocardiogram out patient and he will also be sent home with sublingual nitroglycerin per cariology.   #AKI During his hospitalization, his creatinine increased to 1.57 and down trended to 1.45 on discharge. Please follow up on bmp in 10-14 days.    Discharge Subjective:  Today, he reported having complete relief of chest pain. Denied SOB, palpitations, and pleuritic pain. He does not have any complaints or questions at the moment.   Discharge Exam:   Blood pressure 126/82, pulse 78, temperature 97.6 F (36.4 C), temperature source Oral, resp. rate 19, height 5\' 9"  (1.753 m), weight 89.3 kg, SpO2 96 %.  Constitutional:non ill-appearing comfortably sitting in bed, in no acute distress Cardiovascular: regular rate and rhythm, no m/r/g Pulmonary/Chest: normal work of breathing on room air, lungs clear to auscultation bilaterally. No crackles  Neurological: alert & oriented x 3 MSK: no gross abnormalities. No pitting edema Skin: warm and dry Psych: Normal mood and affect  Pertinent Labs, Studies, and Procedures:     Latest Ref Rng & Units 09/26/2022    6:53 AM 09/25/2022    1:56 PM 09/25/2022   10:08 AM  CBC  WBC 4.0 - 10.5 K/uL 5.5   6.1   Hemoglobin 13.0 - 17.0 g/dL 81.1  91.4  78.2   Hematocrit 39.0 - 52.0 % 40.3  40.0  42.9   Platelets 150 - 400 K/uL 126   145        Latest Ref  Rng & Units 09/26/2022    6:53 AM 09/25/2022    6:16 PM 09/25/2022    1:56 PM  CMP  Glucose 70 - 99 mg/dL 956  213    BUN 8 - 23 mg/dL 30  32    Creatinine 0.86 - 1.24 mg/dL 5.78  4.69    Sodium 629 - 145 mmol/L 138  135  138   Potassium 3.5 - 5.1 mmol/L 4.7  4.6  4.4   Chloride 98 - 111 mmol/L 107  105    CO2 22 - 32 mmol/L 23  21    Calcium 8.9 - 10.3 mg/dL 9.3  9.6      DG Chest 2 View  Result Date: 09/25/2022 CLINICAL DATA:  Chest pain. 3 stents placed 3 months ago. Shortness of breath, productive cough, recent onset of chest tightness. EXAM: CHEST - 2 VIEW COMPARISON:  Chest radiographs 07/02/2014 FINDINGS: Cardiac silhouette is at the upper limits of  normal size. Mediastinal contours are within normal limits. The lungs are clear. No pleural effusion or pneumothorax. Mild multilevel degenerative disc changes of the mid to upper thoracic spine. IMPRESSION: No acute cardiopulmonary process. Electronically Signed   By: Neita Garnet M.D.   On: 09/25/2022 11:43     Discharge Instructions: Discharge Instructions     (HEART FAILURE PATIENTS) Call MD:  Anytime you have any of the following symptoms: 1) 3 pound weight gain in 24 hours or 5 pounds in 1 week 2) shortness of breath, with or without a dry hacking cough 3) swelling in the hands, feet or stomach 4) if you have to sleep on extra pillows at night in order to breathe.   Complete by: As directed    Call MD for:   Complete by: As directed    Please call if you have chest pain and go to an emergency department   Call MD for:  difficulty breathing, headache or visual disturbances   Complete by: As directed    Call MD for:  extreme fatigue   Complete by: As directed    Call MD for:  hives   Complete by: As directed    Call MD for:  persistant dizziness or light-headedness   Complete by: As directed    Call MD for:  persistant nausea and vomiting   Complete by: As directed    Call MD for:  redness, tenderness, or signs of infection  (pain, swelling, redness, odor or green/yellow discharge around incision site)   Complete by: As directed    Call MD for:  severe uncontrolled pain   Complete by: As directed    Call MD for:  temperature >100.4   Complete by: As directed    Diet - low sodium heart healthy   Complete by: As directed    Discharge instructions   Complete by: As directed    You came the the emergency department with chest pain and difficulty breathing (shortness of breath). You were NOT diagnosed with a heart attack. You also had an acute kidney injury.  *For the chest pain and difficulty breathing: -Please begin using sublingual nitroglycerin IF you have chest pain -You may experience a headache and feel dizzy after you take the medication  -Please do see the cardiologist after you leave the hospital and your family doctor.  -Please start taking Lipitor (atorvastatin) 80mg , you may discontinue the 40mg  dose  -Please continue taking your other medications as previously prescribed -Please make an appointment with your PCP   Increase activity slowly   Complete by: As directed        Signed: Faith Rogue DO [facility] - PGY1 Pager: [phone] 09/26/2022, 8:42 PM    Please contact the on call pager after 5 pm and on weekends at [phone].

## 2022-09-26 NOTE — Hospital Course (Addendum)
#  Chest Pain The patient presented to the emergency department with chest pain that was sudden onset and described as primarily pleuritic.  Due to patient's cardiac history, he received a chest X-ray which showed no acute cardiopulmonary process, an EKG that did not show acute ST or T wave changes. Initial troponins were elevated and heparin was started and the repeat troponin's decreased therefore heparin was discontinued. He does not endorse chest pain or shortness of breath this morning. He will be getting an echocardiogram out patient and he will also be sent home with sublingual nitroglycerin per cariology.   #AKI During his hospitalization, his creatinine increased to 1.57 and down trended to 1.45 on discharge. We originally held nephrotoxic agents such as entreso during hospitalization but restarted the home regimen at discharge. Please follow up on bmp in 10-14 days.

## 2022-09-26 NOTE — Progress Notes (Signed)
   Cardiologist:  Clifton James  Subjective:  Denies SSCP, palpitations or Dyspnea   Objective:  Vitals:   09/26/22 0500 09/26/22 0533 09/26/22 0601 09/26/22 0606  BP:  97/81 116/81 116/81  Pulse:  81 87 99  Resp:      Temp:      TempSrc:      SpO2:  99%    Weight: 89.3 kg     Height:        Intake/Output from previous day:  Intake/Output Summary (Last 24 hours) at 09/26/2022 0957 Last data filed at 09/26/2022 0607 Gross per 24 hour  Intake 240 ml  Output 500 ml  Net -260 ml    Physical Exam: Affect appropriate Healthy:  appears stated age HEENT: normal Neck supple with no adenopathy JVP normal no bruits no thyromegaly Lungs clear with no wheezing and good diaphragmatic motion Heart:  S1/S2 no murmur, no rub, gallop or click PMI normal Abdomen: benighn, BS positve, no tenderness, no AAA no bruit.  No HSM or HJR Distal pulses intact with no bruits No edema Neuro non-focal Skin warm and dry No muscular weakness   Lab Results: Basic Metabolic Panel: Recent Labs    09/25/22 1816 09/26/22 0653  NA 135 138  K 4.6 4.7  CL 105 107  CO2 21* 23  GLUCOSE 108* 131*  BUN 32* 30*  CREATININE 1.57* 1.45*  CALCIUM 9.6 9.3  PHOS  --  4.0   Liver Function Tests: Recent Labs    09/25/22 1008 09/26/22 0653  AST 21  --   ALT 21  --   ALKPHOS 44  --   BILITOT 0.4  --   PROT 6.7  --   ALBUMIN 3.7 3.5   Recent Labs    09/25/22 1008  LIPASE 35   CBC: Recent Labs    09/25/22 1008 09/25/22 1356 09/26/22 0653  WBC 6.1  --  5.5  NEUTROABS 3.7  --   --   HGB 13.8 13.6 13.2  HCT 42.9 40.0 40.3  MCV 92.7  --  91.8  PLT 145*  --  126*   Cardiac Enzymes: No results for input(s): "CKTOTAL", "CKMB", "CKMBINDEX", "TROPONINI" in the last 72 hours. BNP: Invalid input(s): "POCBNP" D-Dimer: Recent Labs    09/25/22 1009  DDIMER <0.27    Recent Labs    09/25/22 1816  TSH 0.533     Imaging: CXR NAD   Cardiac Studies:  ECG: SR LBBB chronic    Telemetry:   NSR 09/26/2022   Echo:   Medications:    apixaban  5 mg Oral BID   atorvastatin  80 mg Oral Daily   carvedilol  12.5 mg Oral BID WC   clopidogrel  75 mg Oral Daily   isosorbide mononitrate  30 mg Oral Daily      Assessment/Plan:   Chest Pain:  R/O really no elevation in troponin ECG chronic LBBB recent cath with good revascularization of RCA and only moderate residual dx in distal LCX DAT for new stents 07/13/22 then eliquis and plavix for his prior CVA Continue local cardiac rehab Will arrange outpatient f/u with Dr Gabriel Carina as patient lives in [place] now [place] to d/c home if echo not done can be done as outpatient in office on f/u   Charlton Haws 09/26/2022, 9:57 AM

## 2022-09-28 ENCOUNTER — Encounter (HOSPITAL_COMMUNITY)
Admission: RE | Admit: 2022-09-28 | Discharge: 2022-09-28 | Disposition: A | Discharge status: Home / Self Care | Payer: 59 | Source: Ambulatory Visit | Attending: Cardiology | Admitting: Cardiology

## 2022-09-28 DIAGNOSIS — Z955 Presence of coronary angioplasty implant and graft: Secondary | ICD-10-CM

## 2022-09-28 NOTE — Progress Notes (Incomplete Revision)
It was noted the Steven Brady went to the ED with chest pain and was discharged on 09/26/22. Per Dr Eden Emms patient has been cleared to return to exercise. Appointment obtained for Steven Brady to see Lucile Crater [facility] on 10/07/22 at 1:55 pm. Patient aware of appointment and will let us know if he has any symptoms during exercise at cardiac rehab.Thayer Headings RN BSN

## 2022-09-28 NOTE — Progress Notes (Signed)
It was noted the Steven Brady went to the ED with chest pain and was discharged on 09/26/22. Per Dr Nishan patient has been cleared to return to exercise. Appointment obtained for Steven Brady to see [name] PAC on 10/07/22 at 1:55 pm. Patient aware of appointment and will let us know if he has any symptoms during exercise at cardiac rehab.Steven Brady Steven Brady Gunnels RN BSN  

## 2022-09-30 ENCOUNTER — Encounter (HOSPITAL_COMMUNITY)
Admission: RE | Admit: 2022-09-30 | Discharge: 2022-09-30 | Disposition: A | Discharge status: Home / Self Care | Payer: 59 | Source: Ambulatory Visit | Attending: Cardiology | Admitting: Cardiology

## 2022-09-30 DIAGNOSIS — Z955 Presence of coronary angioplasty implant and graft: Secondary | ICD-10-CM

## 2022-09-30 LAB — GLUCOSE, CAPILLARY: Glucose-Capillary: 141 mg/dL — ABNORMAL HIGH (ref 70–99)

## 2022-09-30 NOTE — Progress Notes (Signed)
Reviewed home exercise Rx with patient today.  Encouraged warm-up, cool-down, and stretching. Reviewed THRR of 62-124 and keeping RPE between 11-13. Encouraged to hydrate with activity.  Reviewed weather parameters for temperature and humidity for safe exercise outdoors. Reviewed S/S to terminate exercise and when to call 911 vs MD. Reviewed the use of NTG and pt was encouraged to carry at all times. Pt encouraged to always carry a cell phone for safety when exercising outdoors. Pt verbalized understanding of the home exercise Rx and was provided a copy.   Heraclio Seidman MS, ACSM-CEP, CCRP  

## 2022-10-02 ENCOUNTER — Encounter (HOSPITAL_COMMUNITY)
Admission: RE | Admit: 2022-10-02 | Discharge: 2022-10-02 | Disposition: A | Discharge status: Home / Self Care | Payer: 59 | Source: Ambulatory Visit | Attending: Cardiology | Admitting: Cardiology

## 2022-10-02 DIAGNOSIS — Z955 Presence of coronary angioplasty implant and graft: Secondary | ICD-10-CM | POA: Diagnosis not present

## 2022-10-05 ENCOUNTER — Encounter (HOSPITAL_COMMUNITY): Admission: RE | Admit: 2022-10-05 | Payer: 59 | Source: Ambulatory Visit

## 2022-10-05 DIAGNOSIS — Z955 Presence of coronary angioplasty implant and graft: Secondary | ICD-10-CM

## 2022-10-06 ENCOUNTER — Encounter: Payer: Self-pay | Admitting: Physician Assistant

## 2022-10-06 NOTE — Progress Notes (Unsigned)
Cardiology Office Note    Date:  10/07/2022  ID:  Izsak Meir, DOB November 03, 1955, MRN [id number] PCP:  Pcp, No  Cardiologist:  Verne Carrow, MD  Electrophysiologist:  None   Chief Complaint: post-hospital f/u chest pain, CHF   History of Present Illness: .    Sabino Denning is a 67 y.o. 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   Patient was previously followed by [facility] with chronic HFrEF of at least 10+ years of unknown etiology as well as stroke in 2022 in setting of EF reported 11%. He apparently had previously been followed in [place] before he had seen them. He had was started on Eliquis in 2022 after stroke in the setting of EF 11% recommended by neurology and cardiology as stroke was felt most likely cardioembolic. Per notes, 30-day cardiac monitor completed on 03/2021 showed normal sinus rhythm, periodic intraventricular conduction delay, and episodes of nonsustained ventricular tachycardia. The patient had reported a remote cath with unclear details. [facility] initiated a workup for coronary disease with abnormal cor CT showing multivessel CAD as well as cMRI which reportedly suggested prior infarct involving the inferior wall. He ultimately underwent cath 06/2022 showing "minimal LAD disease. Marginal 35%. PDA 30%. Distal circumflex artery 70% stenosis. The proximal mid and RCA had a 70% stenosis with positive FFR. Stents were placed to proximal RCA, mid RCA, distal RCA." He was placed on triple therapy with Eliquis, Plavix and aspirin for 2 weeks. After 14 days of aspirin was discontinued. They suggested consideration of BiV-ICD. Notes report patient initially declined. Our team recently saw him early July 2024 for chest pain and minimal troponin elevation. 2D echo showed EF 25-30%, G1DD, global HK,  moderate LVH. Continued medical therapy was recommended. Atorvastatin was increased. He requested to f/u with [facility] going forward.  He is seen back today for follow-up with his fiance. He is very engaged in his cardiac care and reports med adherence. He clarifies his home dose of hydralazine is 50mg  QAM, 25mg  every afternoon, 50mg  every evening. He reports episodic lower BPs at home I.e. 104 systolic. He has had chronic dizziness ever since his stroke. Otherwise he reports he's felt fine without any recurrent chest pain. DOE at baseline. Ambulates with cane. No syncope reported. No LE edema. We discussed referral to EP to consider CRT-D and AHF given his persistent cardiomyopathy despite good med rx.  Labwork independently reviewed: 09/2022 Hgb 13.2, plt 126 c/w prior, K 4.7, Cr 1.45 c/w prior, TSH wnl, ddimer neg, hsTrop peak 55, BNP 174, lipase wnl, LFTS ok   ROS: Please see the history of present illness.  All other systems are reviewed and otherwise negative.   Studies Reviewed: Marland Kitchen    EKG:  EKG is not ordered today but reviewed 09/25/22 with NSR LBBB 69bpm  CV Studies: Cardiac studies reviewed are outlined and summarized above. Otherwise please see EMR for full report.  Physical Exam:    VS:  BP 118/72   Pulse 78   Ht 5\' 9"  (1.753 m)   Wt 202 lb (91.6 kg)   SpO2 95%   BMI 29.83 kg/m    Wt Readings from Last 3 Encounters:  10/07/22 202 lb (91.6 kg)  09/26/22 196 lb 13.9 oz (89.3 kg)  08/11/22 209 lb 8 oz (95 kg)    GEN: Well nourished, well developed in  no acute distress NECK: No JVD; No carotid bruits. Arcus senilis CARDIAC: RRR, no murmurs, rubs, gallops RESPIRATORY:  Clear to auscultation without rales, wheezing or rhonchi  ABDOMEN: Soft, non-tender, non-distended EXTREMITIES:  No edema; No acute deformity   Asessement and Plan:.    1. Chronic HFrEF - appears euvolemic on exam. He is on excellent medical therapy including carvedilol, Farxiga, Lasix PRN, hydralazine,  Entresto and spironolactone. To avoid confusion on his hydralazine dosing (with 2 different strengths), we'll consolidate the rx to 50mg  tablet taking 1 tablet QAM, 1/2 tablet in afternoon, 1 tablet QPM - this is not changing the dose but rather consolidating to one tablet formation. He reports episodic low-normal BP at home and chronic dizziness so I will hold off med titration. Given his persistent cardiomyopathy despite aggressive medical therapy, I would suggest referral to AHF to see if there is any additional recommendations for his therapy. I also would recommend to refer to EP to consider CRT-D since EF has remained persistently low despite PCI in 06/2022.  2. CAD - no recurrent chest pain. Medical therapy recommended by hospital team without plans for repeat ischemic evaluation. Maintained on Plavix without ASA given Eliquis use. Will need to review plans for Plavix rx at 06/2023 follow-up. Continue atorvastatin. Recheck f/u CMET/lipids in 2 months.  3. LBBB, NSVT by prior event monitor - no syncope reported. Continue BB. Refer to EP to consider CRT-D as above. He is agreeable to this.  4. History of stroke - maintained on Eliquis due to concern for cardioembolic source given LVEF was as low as 11% at time of stroke. This seems completely reasonable to continue. Dose remains appropriate. Anticipate periodically monitoring CBC for platelet count, periodically decreased in the past.     Disposition: F/u with me in 3 months. Refer to EP and AHF as above.  Signed, Laurann Montana, PA-C

## 2022-10-07 ENCOUNTER — Ambulatory Visit: Payer: 59 | Attending: Physician Assistant | Admitting: Physician Assistant

## 2022-10-07 ENCOUNTER — Encounter: Payer: Self-pay | Admitting: Physician Assistant

## 2022-10-07 ENCOUNTER — Encounter (HOSPITAL_COMMUNITY): Payer: 59

## 2022-10-07 VITALS — BP 118/72 | HR 78 | Ht 69.0 in | Wt 202.0 lb

## 2022-10-07 DIAGNOSIS — Z8673 Personal history of transient ischemic attack (TIA), and cerebral infarction without residual deficits: Secondary | ICD-10-CM

## 2022-10-07 DIAGNOSIS — I447 Left bundle-branch block, unspecified: Secondary | ICD-10-CM

## 2022-10-07 DIAGNOSIS — I5022 Chronic systolic (congestive) heart failure: Secondary | ICD-10-CM

## 2022-10-07 DIAGNOSIS — I2510 Atherosclerotic heart disease of native coronary artery without angina pectoris: Secondary | ICD-10-CM

## 2022-10-07 MED ORDER — HYDRALAZINE HCL 50 MG PO TABS: ORAL_TABLET | ORAL | 2 refills | Status: DC

## 2022-10-07 NOTE — Patient Instructions (Signed)
Medication Instructions:  UPDATE Hydralazine to 50mg  take 1 tablet in the morning, half tab in the afternoon and 1 tab in the evening. *If you need a refill on your cardiac medications before your next appointment, please call your pharmacy*   Lab Work: 2 months-CMET & LIPDS If you have labs (blood work) drawn today and your tests are completely normal, you will receive your results only by: MyChart Message (if you have MyChart) OR A paper copy in the mail If you have any lab test that is abnormal or we need to change your treatment, we will call you to review the results.   Testing/Procedures: None ordered   Follow-Up: At [facility], you and your health needs are our priority.  As part of our continuing mission to provide you with exceptional heart care, we have created designated Provider Care Teams.  These Care Teams include your primary Cardiologist (physician) and Advanced Practice Providers (APPs -  Physician Assistants and Nurse Practitioners) who all work together to provide you with the care you need, when you need it.  We recommend signing up for the patient portal called "MyChart".  Sign up information is provided on this After Visit Summary.  MyChart is used to connect with patients for Virtual Visits (Telemedicine).  Patients are able to view lab/test results, encounter notes, upcoming appointments, etc.  Non-urgent messages can be sent to your provider as well.   To learn more about what you can do with MyChart, go to [email or web address].    Your next appointment:   3 month(s)  Provider:   Ronie Spies, PA-C       Other Instructions You have been referred to [facility]

## 2022-10-09 ENCOUNTER — Encounter (HOSPITAL_COMMUNITY): Payer: 59

## 2022-10-09 ENCOUNTER — Telehealth (HOSPITAL_COMMUNITY): Payer: Self-pay

## 2022-10-12 ENCOUNTER — Encounter (HOSPITAL_COMMUNITY)
Admission: RE | Admit: 2022-10-12 | Discharge: 2022-10-12 | Disposition: A | Discharge status: Home / Self Care | Payer: 59 | Source: Ambulatory Visit | Attending: Cardiology | Admitting: Cardiology

## 2022-10-12 DIAGNOSIS — Z955 Presence of coronary angioplasty implant and graft: Secondary | ICD-10-CM | POA: Diagnosis not present

## 2022-10-14 ENCOUNTER — Encounter (HOSPITAL_COMMUNITY): Admission: RE | Admit: 2022-10-14 | Payer: 59 | Source: Ambulatory Visit

## 2022-10-14 DIAGNOSIS — Z955 Presence of coronary angioplasty implant and graft: Secondary | ICD-10-CM | POA: Diagnosis not present

## 2022-10-16 ENCOUNTER — Encounter (HOSPITAL_COMMUNITY): Payer: 59

## 2022-10-19 ENCOUNTER — Encounter (HOSPITAL_COMMUNITY)
Admission: RE | Admit: 2022-10-19 | Discharge: 2022-10-19 | Disposition: A | Discharge status: Home / Self Care | Payer: 59 | Source: Ambulatory Visit | Attending: Cardiology | Admitting: Cardiology

## 2022-10-19 DIAGNOSIS — Z955 Presence of coronary angioplasty implant and graft: Secondary | ICD-10-CM | POA: Diagnosis not present

## 2022-10-20 NOTE — Progress Notes (Signed)
Cardiac Individual Treatment Plan  Patient Details  Name: Steven Brady MRN: [id number] Date of Birth: January 26, 1956 Referring Provider:   Flowsheet Row INTENSIVE CARDIAC REHAB ORIENT from 08/11/2022 in [facility]  Referring Provider Dr. Cherly Anderson (Armanda Magic, MD covering)       Initial Encounter Date:  Flowsheet Row INTENSIVE CARDIAC REHAB ORIENT from 08/11/2022 in [facility]  Date 08/11/22       Visit Diagnosis: 07/13/22 S/P coronary artery stent placement RCA x 3  Patient's Home Medications on Admission:  Current Outpatient Medications:    apixaban (ELIQUIS) 5 MG TABS tablet, Take 5 mg by mouth 2 (two) times daily., Disp: , Rfl:    atorvastatin (LIPITOR) 80 MG tablet, Take 1 tablet (80 mg total) by mouth daily., Disp: 30 tablet, Rfl: 0   carvedilol (COREG) 12.5 MG tablet, Take 1 tablet (12.5 mg total) by mouth 2 (two) times daily., Disp: 60 tablet, Rfl: 1   Cholecalciferol (VITAMIN D3) 2000 UNITS TABS, Take 2,000 Units by mouth daily., Disp: 30 tablet, Rfl: 11   clopidogrel (PLAVIX) 75 MG tablet, Take 75 mg by mouth daily., Disp: , Rfl:    Continuous Glucose Receiver (DEXCOM G6 RECEIVER) DEVI, by Does not apply route as directed., Disp: , Rfl:    Continuous Glucose Sensor (DEXCOM G6 SENSOR) MISC, as directed., Disp: , Rfl:    Continuous Glucose Transmitter (DEXCOM G6 TRANSMITTER) MISC, by Does not apply route as directed., Disp: , Rfl:    dapagliflozin propanediol (FARXIGA) 10 MG TABS tablet, Take 10 mg by mouth daily., Disp: , Rfl:    furosemide (LASIX) 20 MG tablet, Take 20 mg by mouth as needed for fluid or edema., Disp: , Rfl:    hydrALAZINE (APRESOLINE) 50 MG tablet, TAKE 1 TABLET IN THE MORNING; HALF TAB IN THE AFTERNOON AND 1 TAB IN THE EVENING, Disp: 75 tablet, Rfl: 2   meclizine (ANTIVERT) 25 MG tablet, Take 1 tablet (25 mg total) by mouth 2 (two) times  daily. (Patient taking differently: Take 25 mg by mouth as needed for dizziness or nausea.), Disp: 60 tablet, Rfl: 1   metFORMIN (GLUCOPHAGE) 500 MG tablet, Take 500 mg by mouth 2 (two) times daily with a meal., Disp: , Rfl:    Multiple Vitamins-Minerals (MULTIVITAMIN WITH MINERALS) tablet, Take 1 tablet by mouth daily., Disp: , Rfl:    nitroGLYCERIN (NITROSTAT) 0.3 MG SL tablet, Place 1 tablet (0.3 mg total) under the tongue every 5 (five) minutes as needed for chest pain (Only take medication if you have chest pain. 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   recently hurt left ankle - 2 weeks ago   CAD (coronary artery disease)    CHF (congestive heart failure) (HCC) 03/23/2013   Chronic HFrEF (heart failure with reduced ejection fraction) (HCC)    Chronic kidney disease, stage 3 (HCC)    Hematuria - cause not known 06/08/2011   pt has been seeing blood off and on in urine   Hyperlipidemia Dx 2015   Hypertension Dx 2015   LBBB (left bundle  branch block)    Stroke (HCC)    Thrombocytopenia (HCC)    Tobacco abuse    since age of 71    Tobacco Use: Social History   Tobacco Use  Smoking Status Light Smoker   Current packs/day: 0.25   Types: Cigarettes  Smokeless Tobacco Never    Labs: Review Flowsheet  More data may exist      Latest Ref Rng & Units 08/25/2009 08/26/2009 06/09/2011 09/25/2022 09/26/2022  Labs for ITP Cardiac and Pulmonary Rehab  Cholestrol 0 - 200 mg/dL - 528        ATP III CLASSIFICATION:  <200     mg/dL   Desirable  413-244  mg/dL   Borderline High  >=010    mg/dL   High         272  - -  LDL (calc) 0 - 99 mg/dL - 536        Total  Cholesterol/HDL:CHD Risk Coronary Heart Disease Risk Table                     Men   Women  1/2 Average Risk   3.4   3.3  Average Risk       5.0   4.4  2 X Average Risk   9.6   7.1  3 X Average Risk  23.4   11.0        Use the calculated Patient Ratio above and the CHD Risk Table to determine the patient's CHD Risk.        ATP III CLASSIFICATION (LDL):  <100     mg/dL   Optimal  644-034  mg/dL   Near or Above                    Optimal  130-159  mg/dL   Borderline  742-595  mg/dL   High  >638     mg/dL   Very High  756  - -  HDL-C >39 mg/dL - 46  43  - -  Trlycerides <150 mg/dL - 68  48  - -  Hemoglobin A1c <5.7 % 6.0 (NOTE)                                                                       According to the ADA Clinical Practice Recommendations for 2011, when HbA1c is used as a screening test:   >=6.5%   Diagnostic of Diabetes Mellitus           (if abnormal result  is confirmed)  5.7-6.4%   Increased risk of developing Diabetes Mellitus  References:Diagnosis and Classification of Diabetes Mellitus,Diabetes Care,2011,34(Suppl 1):S62-S69 and Standards of Medical Care in         Diabetes - 2011,Diabetes Care,2011,34  (Suppl 1):S11-S61.  - - - -  PH, Arterial 7.35 - 7.45 - - - - 7.4   PCO2 arterial 32 - 48 mmHg - - - - 34   Bicarbonate 20.0 - 28.0 mmol/L - - - 26.5  19.2  21.1   TCO2 22 - 32 mmol/L - - - 20  -  Acid-base deficit 0.0 - 2.0 mmol/L - - - 2.0  3.0   O2 Saturation % - - -  54.5  98  99.4     Details       Multiple values from one day are sorted in reverse-chronological order         Capillary Blood Glucose: Lab Results  Component Value Date   GLUCAP 141 (H) 09/30/2022   GLUCAP 107 (H) 09/21/2022   GLUCAP 177 (H) 08/27/2009   GLUCAP 114 (H) 08/27/2009     Exercise Target Goals: Exercise Program Goal: Individual exercise prescription set using results from initial 6 min walk test and THRR while considering  patient's activity barriers and safety.    Exercise Prescription Goal: Initial exercise prescription builds to 30-45 minutes a day of aerobic activity, 2-3 days per week.  Home exercise guidelines will be given to patient during program as part of exercise prescription that the participant will acknowledge.  Activity Barriers & Risk Stratification:  Activity Barriers & Cardiac Risk Stratification - 08/11/22 1112       Activity Barriers & Cardiac Risk Stratification   Activity Barriers Arthritis;Neck/Spine Problems;Back Problems;Joint Problems;Balance Concerns;Deconditioning;Assistive Device;Other (comment)    Comments S/P CVA with rt side residual deficits, Pt with h/o vertigo    Cardiac Risk Stratification High             6 Minute Walk:  6 Minute Walk     Row Name 08/11/22 1110         6 Minute Walk   Phase Initial  Pt used wheeled walker instead of his cane     Distance 944 feet  Pt used wheeled walker     Walk Time 6 minutes     # of Rest Breaks 0     MPH 1.8     METS 2.6     RPE 7     Perceived Dyspnea  0     VO2 Peak 8.95     Symptoms No     Resting HR 78 bpm     Resting BP 100/72     Resting Oxygen Saturation  98 %     Exercise Oxygen Saturation  during 6 min walk 97 %     Max Ex. HR 114 bpm     Max Ex. BP 140/82     2 Minute Post BP 124/70              Oxygen Initial Assessment:   Oxygen Re-Evaluation:   Oxygen Discharge (Final Oxygen Re-Evaluation):   Initial Exercise Prescription:  Initial Exercise Prescription - 08/11/22 1300       Date of Initial Exercise RX and Referring Provider   Date 08/11/22    Referring Provider Dr. Cherly Anderson Armanda Magic, MD covering)    Expected Discharge Date 10/23/22      NuStep   Level 1    SPM 80    Minutes 15    METs 2.6      Arm Ergometer   Level 1    Watts 25    RPM 60    Minutes 15    METs 2.6      Prescription Details   Frequency (times per week) 3    Duration Progress to 30 minutes of continuous aerobic without signs/symptoms  of physical distress      Intensity   THRR 40-80% of Max Heartrate 62-124    Ratings of Perceived Exertion 11-13    Perceived Dyspnea 0-4      Progression   Progression Continue progressive overload as per policy without signs/symptoms or physical distress.  Resistance Training   Training Prescription Yes    Weight 3 lbs    Reps 10-15             Perform Capillary Blood Glucose checks as needed.  Exercise Prescription Changes:   Exercise Prescription Changes     Row Name 08/19/22 1400 09/02/22 1500 09/23/22 1400 09/30/22 1400       Response to Exercise   Blood Pressure (Admit) 106/70 94/56 110/72 103/63    Blood Pressure (Exercise) 152/84 114/62 140/90 142/82    Blood Pressure (Exit) 112/60 96/62 100/60 98/70    Heart Rate (Admit) 81 bpm 76 bpm 72 bpm 73 bpm    Heart Rate (Exercise) 118 bpm 97 bpm 98 bpm 89 bpm    Heart Rate (Exit) 89 bpm 85 bpm 71 bpm 81 bpm    Rating of Perceived Exertion (Exercise) 10 11 10 10     Symptoms None None None None    Comments Pt's first day in the CRP2 program Reviewed METs Reviewed METs and goals Reviewed Home exercise Rx    Duration Continue with 30 min of aerobic exercise without signs/symptoms of physical distress. Continue with 30 min of aerobic exercise without signs/symptoms of physical distress. Continue with 30 min of aerobic exercise without signs/symptoms of physical distress. Continue with 30 min of aerobic exercise without signs/symptoms of physical distress.    Intensity THRR unchanged THRR unchanged THRR unchanged THRR unchanged      Progression   Progression Continue to progress workloads to maintain intensity without signs/symptoms of physical distress. Continue to progress workloads to maintain intensity without signs/symptoms of physical distress. Continue to progress workloads to maintain intensity without signs/symptoms of physical distress. Continue to progress workloads to maintain intensity without signs/symptoms  of physical distress.    Average METs 2.3 2.2 2.1 2.1      Resistance Training   Training Prescription No No No No    Weight No weights on wednesday No weights on wednesday No weights on wednesday No weights on wednesday      Interval Training   Interval Training No -- -- No      NuStep   Level 1 1 2 2     SPM 87 93 -- 103    Minutes 15 15 15 15     METs 2.3 2.2 2.2 2.1      Arm Ergometer   Level 1 2.5 2.5 2.5    Watts -- 22 18 19     RPM -- 51 46 52    Minutes 15 15 15 15     METs -- 2.2 2 2.1      Home Exercise Plan   Plans to continue exercise at -- -- -- Home (comment)    Frequency -- -- -- Add 4 additional days to program exercise sessions.    Initial Home Exercises Provided -- -- -- 09/30/22             Exercise Comments:   Exercise Comments     Row Name 08/19/22 1419 09/02/22 1500 09/23/22 1414 09/30/22 1410     Exercise Comments Pt's first day in the CRP2 program. Pt exercise with no complaints. Off to a good start. Reviewed METs. METs at plateau becacuase pt was out last week week due to shoulder issue. Will consider increasing workload on Nustep next session. Reviewed METs and goals. Pt feeling less energetic today and METs are decreased. Encouraged patient to do the best he can after his recent move. Pt voices now living on  the 2nd floor so has to do more stairs. Reviewed home exercise Rx with patient today. Pt voices that he is walking daily on the treadmill at home for 20 minutes in addtion to doing stretching. Pt to continue treadmill and will try to increase time to 25-30 minutes. Pt verbalized understanding of the home exercise Rx and was provided a copy.             Exercise Goals and Review:   Exercise Goals     Row Name 08/11/22 1113             Exercise Goals   Increase Physical Activity Yes       Intervention Provide advice, education, support and counseling about physical activity/exercise needs.;Develop an individualized exercise  prescription for aerobic and resistive training based on initial evaluation findings, risk stratification, comorbidities and participant's personal goals.       Expected Outcomes Short Term: Attend rehab on a regular basis to increase amount of physical activity.;Long Term: Add in home exercise to make exercise part of routine and to increase amount of physical activity.;Long Term: Exercising regularly at least 3-5 days a week.       Increase Strength and Stamina Yes       Intervention Provide advice, education, support and counseling about physical activity/exercise needs.;Develop an individualized exercise prescription for aerobic and resistive training based on initial evaluation findings, risk stratification, comorbidities and participant's personal goals.       Expected Outcomes Short Term: Increase workloads from initial exercise prescription for resistance, speed, and METs.;Short Term: Perform resistance training exercises routinely during rehab and add in resistance training at home;Long Term: Improve cardiorespiratory fitness, muscular endurance and strength as measured by increased METs and functional capacity ( )       Able to understand and use rate of perceived exertion (RPE) scale Yes       Intervention Provide education and explanation on how to use RPE scale       Expected Outcomes Short Term: Able to use RPE daily in rehab to express subjective intensity level;Long Term:  Able to use RPE to guide intensity level when exercising independently       Knowledge and understanding of Target Heart Rate Range (THRR) Yes       Intervention Provide education and explanation of THRR including how the numbers were predicted and where they are located for reference       Expected Outcomes Short Term: Able to state/look up THRR;Long Term: Able to use THRR to govern intensity when exercising independently;Short Term: Able to use daily as guideline for intensity in rehab       Understanding of  Exercise Prescription Yes       Intervention Provide education, explanation, and written materials on patient's individual exercise prescription       Expected Outcomes Short Term: Able to explain program exercise prescription;Long Term: Able to explain home exercise prescription to exercise independently                Exercise Goals Re-Evaluation :  Exercise Goals Re-Evaluation     Row Name 08/19/22 1417 09/23/22 1413           Exercise Goal Re-Evaluation   Exercise Goals Review Increase Physical Activity;Increase Strength and Stamina;Able to understand and use rate of perceived exertion (RPE) scale;Knowledge and understanding of Target Heart Rate Range (THRR);Understanding of Exercise Prescription Increase Physical Activity;Increase Strength and Stamina;Able to understand and use rate of perceived exertion (RPE) scale;Knowledge and  understanding of Target Heart Rate Range (THRR);Understanding of Exercise Prescription      Comments Pt's first day in the CRP2 program. Pt understands the RPE scale, Exercise Rx, and THRR. Reviewed METs and goals. Peak METs to date are 2.7. Pt feels that that there has been some improvement in strength and stamina but it has not increased as much as he would like.      Expected Outcomes Will continue to monitor patient and progress exercise workloads as tolerated. Will continue to monitor patient and progress exercise workloads as tolerated.               Discharge Exercise Prescription (Final Exercise Prescription Changes):  Exercise Prescription Changes - 09/30/22 1400       Response to Exercise   Blood Pressure (Admit) 103/63    Blood Pressure (Exercise) 142/82    Blood Pressure (Exit) 98/70    Heart Rate (Admit) 73 bpm    Heart Rate (Exercise) 89 bpm    Heart Rate (Exit) 81 bpm    Rating of Perceived Exertion (Exercise) 10    Symptoms None    Comments Reviewed Home exercise Rx    Duration Continue with 30 min of aerobic exercise without  signs/symptoms of physical distress.    Intensity THRR unchanged      Progression   Progression Continue to progress workloads to maintain intensity without signs/symptoms of physical distress.    Average METs 2.1      Resistance Training   Training Prescription No    Weight No weights on wednesday      Interval Training   Interval Training No      NuStep   Level 2    SPM 103    Minutes 15    METs 2.1      Arm Ergometer   Level 2.5    Watts 19    RPM 52    Minutes 15    METs 2.1      Home Exercise Plan   Plans to continue exercise at Home (comment)    Frequency Add 4 additional days to program exercise sessions.    Initial Home Exercises Provided 09/30/22             Nutrition:  Target Goals: Understanding of nutrition guidelines, daily intake of sodium 1500mg , cholesterol 200mg , calories 30% from fat and 7% or less from saturated fats, daily to have 5 or more servings of fruits and vegetables.  Biometrics:  Pre Biometrics - 08/11/22 0950       Pre Biometrics   Waist Circumference 44 inches    Hip Circumference 46 inches    Waist to Hip Ratio 0.96 %    Triceps Skinfold 10 mm    % Body Fat 29 %    Grip Strength 36 kg    Flexibility --   Not performed due to back problems   Single Leg Stand 2.35 seconds   High risk for fall             Nutrition Therapy Plan and Nutrition Goals:  Nutrition Therapy & Goals - 10/14/22 1405       Nutrition Therapy   Diet Heart healthy diet    Drug/Food Interactions Statins/Certain Fruits      Personal Nutrition Goals   Nutrition Goal Patient to identify strategies for reducing cardiovascular risk by attending the [facility] education and nutrition series weekly.   in action.   Personal Goal #2 Patient to improve diet quality by using  the plate method as a guide for meal planning to include lean protein/plant protein, fruits, vegetables, whole grains, nonfat dairy as part of a well-balanced diet.   in action.    Personal Goal #3 Patient to reduce sodium to 1500mg  per day   in action.   Comments Goals in action. Trey Paula continues to attend the [facility] education and nutrition series regularly. He has started making many dietary changes including increased dietary fiber and decreased sodium intake. He is down 6.8# since starting with our program. His A1c has improved to 7.2. Patient will benefit from participation in intensive cardiac rehab for nutrition, exercise, and lifestyle modification.      Intervention Plan   Intervention Prescribe, educate and counsel regarding individualized specific dietary modifications aiming towards targeted core components such as weight, hypertension, lipid management, diabetes, heart failure and other comorbidities.;Nutrition handout(s) given to patient.    Expected Outcomes Short Term Goal: Understand basic principles of dietary content, such as calories, fat, sodium, cholesterol and nutrients.;Long Term Goal: Adherence to prescribed nutrition plan.             Nutrition Assessments:  Nutrition Assessments - 08/21/22 1414       Rate Your Plate Scores   Pre Score 58            MEDIFICTS Score Key: ?70 Need to make dietary changes  40-70 Heart Healthy Diet ? 40 Therapeutic Level Cholesterol Diet   Flowsheet Row INTENSIVE CARDIAC REHAB from 08/21/2022 in [facility]  Picture Your Plate Total Score on Admission 58      Picture Your Plate Scores: <96 Unhealthy dietary pattern with much room for improvement. 41-50 Dietary pattern unlikely to meet recommendations for good health and room for improvement. 51-60 More healthful dietary pattern, with some room for improvement.  >60 Healthy dietary pattern, although there may be some specific behaviors that could be improved.    Nutrition Goals Re-Evaluation:  Nutrition Goals Re-Evaluation     Row Name 08/21/22 1409 09/17/22 1546 10/14/22 1405          Goals   Current Weight 205 lb 11 oz (93.3 kg) 206 lb 12.7 oz (93.8 kg) 202 lb 9.6 oz (91.9 kg)     Comment Cr 1.37, GFR 57, A1c 7.5; other most recent labs Cr 1.37, GFR 57, GFR 61, Cr 1.3; other most recent labs A1c 7.2     Expected Outcome Patient will benefit from participation in intensive cardiac rehab for nutrition, exercise, and lifestyle modification. Patient's attendance has been variable. He has not attended intensive cardiac rehab since 6/12 and only attended 4 session. Will follow-up on goals and lifestyle changes upon patient's return. Patient will benefit from participation in intensive cardiac rehab for nutrition, exercise, and lifestyle modification. Goals in action. Trey Paula continues to attend the [facility] education and nutrition series regularly. He has started making many dietary changes including increased dietary fiber and decreased sodium intake. He is down 6.8# since starting with our program. His A1c has improved to 7.2. Patient will benefit from participation in intensive cardiac rehab for nutrition, exercise, and lifestyle modification.              Nutrition Goals Re-Evaluation:  Nutrition Goals Re-Evaluation     Row Name 08/21/22 1409 09/17/22 1546 10/14/22 1405         Goals   Current Weight 205 lb 11 oz (93.3 kg) 206 lb 12.7 oz (93.8 kg) 202 lb 9.6  oz (91.9 kg)     Comment Cr 1.37, GFR 57, A1c 7.5; other most recent labs Cr 1.37, GFR 57, GFR 61, Cr 1.3; other most recent labs A1c 7.2     Expected Outcome Patient will benefit from participation in intensive cardiac rehab for nutrition, exercise, and lifestyle modification. Patient's attendance has been variable. He has not attended intensive cardiac rehab since 6/12 and only attended 4 session. Will follow-up on goals and lifestyle changes upon patient's return. Patient will benefit from participation in intensive cardiac rehab for nutrition, exercise, and lifestyle modification. Goals in action. Trey Paula continues to attend  the [facility] education and nutrition series regularly. He has started making many dietary changes including increased dietary fiber and decreased sodium intake. He is down 6.8# since starting with our program. His A1c has improved to 7.2. Patient will benefit from participation in intensive cardiac rehab for nutrition, exercise, and lifestyle modification.              Nutrition Goals Discharge (Final Nutrition Goals Re-Evaluation):  Nutrition Goals Re-Evaluation - 10/14/22 1405       Goals   Current Weight 202 lb 9.6 oz (91.9 kg)    Comment GFR 61, Cr 1.3; other most recent labs A1c 7.2    Expected Outcome Goals in action. Trey Paula continues to attend the [facility] education and nutrition series regularly. He has started making many dietary changes including increased dietary fiber and decreased sodium intake. He is down 6.8# since starting with our program. His A1c has improved to 7.2. Patient will benefit from participation in intensive cardiac rehab for nutrition, exercise, and lifestyle modification.             Psychosocial: Target Goals: Acknowledge presence or absence of significant depression and/or stress, maximize coping skills, provide positive support system. Participant is able to verbalize types and ability to use techniques and skills needed for reducing stress and depression.  Initial Review & Psychosocial Screening:  Initial Psych Review & Screening - 08/11/22 1114       Initial Review   Current issues with Current Depression;Current Sleep Concerns   Pt declines counseling at this time     Family Dynamics   Good Support System? Yes   Pt has daughter and son, girlfriend, and friend Bernette Redbird and his wife     Barriers   Psychosocial barriers to participate in program The patient should benefit from training in stress management and relaxation.      Screening Interventions   Interventions Encouraged to exercise    Expected Outcomes Short Term goal: Utilizing psychosocial  counselor, staff and physician to assist with identification of specific Stressors or current issues interfering with healing process. Setting desired goal for each stressor or current issue identified.;Long Term Goal: Stressors or current issues are controlled or eliminated.;Short Term goal: Identification and review with participant of any Quality of Life or Depression concerns found by scoring the questionnaire.;Long Term goal: The participant improves quality of Life and PHQ9 Scores as seen by post scores and/or verbalization of changes             Quality of Life Scores:  Quality of Life - 08/11/22 1340       Quality of Life   Select Quality of Life      Quality of Life Scores   Health/Function Pre 20.07 %    Socioeconomic Pre 23.5 %    Psych/Spiritual Pre 24.83 %    Family Pre 30 %    GLOBAL Pre  23.17 %            Scores of 19 and below usually indicate a poorer quality of life in these areas.  A difference of  2-3 points is a clinically meaningful difference.  A difference of 2-3 points in the total score of the Quality of Life Index has been associated with significant improvement in overall quality of life, self-image, physical symptoms, and general health in studies assessing change in quality of life.  PHQ-9: Review Flowsheet       08/11/2022 07/30/2014 07/16/2014 07/10/2014  Depression screen PHQ 2/9  Decreased Interest 1 0 1 1  Down, Depressed, Hopeless 1 2 1 2   PHQ - 2 Score 2 2 2 3   Altered sleeping 1 2 - -  Tired, decreased energy 1 2 - -  Change in appetite 1 1 - -  Feeling bad or failure about yourself  0 2 - -  Trouble concentrating 0 2 - -  Moving slowly or fidgety/restless 0 2 - -  Suicidal thoughts 0 0 - -  PHQ-9 Score 5 13 - -  Difficult doing work/chores Not difficult at all Somewhat difficult - -    Details           Interpretation of Total Score  Total Score Depression Severity:  1-4 = Minimal depression, 5-9 = Mild depression, 10-14 =  Moderate depression, 15-19 = Moderately severe depression, 20-27 = Severe depression   Psychosocial Evaluation and Intervention:   Psychosocial Re-Evaluation:  Psychosocial Re-Evaluation     Row Name 08/20/22 1558 09/23/22 0854 10/20/22 1538         Psychosocial Re-Evaluation   Current issues with Current Depression;Current Stress Concerns Current Depression;Current Stress Concerns Current Depression;Current Stress Concerns     Comments Trey Paula started intensive cardiac rehab on 08/19/22. Will review PHQ-9 in the upcoming week. Trey Paula denied concerns or stressors on his first day of exercise Reviewed PHQ2-9 on 08/31/22. Trey Paula denies being depressed. Trey Paula says he just found out he has diabetes and recently moved Trey Paula denies being depressed. Trey Paula has not voiced any increased concerns or stressors during exercise.     Expected Outcomes Trey Paula will have decreased or controlled depression/ stressors upon completion of intensive cardiac rehab Trey Paula will have decreased or controlled depression/ stressors upon completion of intensive cardiac rehab Trey Paula will have decreased or controlled depression/ stressors upon completion of intensive cardiac rehab     Interventions Stress management education;Encouraged to attend Cardiac Rehabilitation for the exercise Stress management education;Encouraged to attend Cardiac Rehabilitation for the exercise Stress management education;Encouraged to attend Cardiac Rehabilitation for the exercise     Continue Psychosocial Services  Follow up required by staff Follow up required by staff Follow up required by staff       Initial Review   Source of Stress Concerns None Identified None Identified;Chronic Illness None Identified;Chronic Illness     Comments Will continue to monitor and offer support as needed Will continue to monitor and offer support as needed Will continue to monitor and offer support as needed              Psychosocial Discharge (Final Psychosocial  Re-Evaluation):  Psychosocial Re-Evaluation - 10/20/22 1538       Psychosocial Re-Evaluation   Current issues with Current Depression;Current Stress Concerns    Comments Trey Paula denies being depressed. Trey Paula has not voiced any increased concerns or stressors during exercise.    Expected Outcomes Trey Paula will have decreased or controlled depression/ stressors upon completion  of intensive cardiac rehab    Interventions Stress management education;Encouraged to attend Cardiac Rehabilitation for the exercise    Continue Psychosocial Services  Follow up required by staff      Initial Review   Source of Stress Concerns None Identified;Chronic Illness    Comments Will continue to monitor and offer support as needed             Vocational Rehabilitation: Provide vocational rehab assistance to qualifying candidates.   Vocational Rehab Evaluation & Intervention:  Vocational Rehab - 08/11/22 1117       Initial Vocational Rehab Evaluation & Intervention   Assessment shows need for Vocational Rehabilitation No   Pt is retired            Education: Education Goals: Education classes will be provided on a weekly basis, covering required topics. Participant will state understanding/return demonstration of topics presented.    Education     Row Name 08/19/22 1300     Education   Cardiac Education Topics Pritikin   Customer service manager   Weekly Topic Adding Flavor - Sodium-Free   Instruction Review Code 1- Verbalizes Understanding   Class Start Time 1138   Class Stop Time 1210   Class Time Calculation (min) 32 min    Row Name 08/21/22 1200     Education   Cardiac Education Topics Pritikin   Hospital doctor Education   General Education Heart Disease Risk Reduction   Instruction Review Code 1- Verbalizes Understanding   Class Start Time 1135   Class Stop Time 1210    Class Time Calculation (min) 35 min    Row Name 08/31/22 1600     Education   Cardiac Education Topics Pritikin   [employer] Workshops     Workshops   Educator Exercise Physiologist   [facility] Psychosocial   Psychosocial Workshop Recognizing and Reducing Stress   Instruction Review Code 1- Verbalizes Understanding   Class Start Time 1152   Class Stop Time 1234   Class Time Calculation (min) 42 min    Row Name 09/02/22 1500     Education   Cardiac Education Topics Pritikin   Customer service manager   Weekly Topic Personalizing Your Pritikin Plate   Instruction Review Code 1- Verbalizes Understanding   Class Start Time 1355   Class Stop Time 1437   Class Time Calculation (min) 42 min    Row Name 09/23/22 1300     Education   Cardiac Education Topics Pritikin   Customer service manager   Weekly Topic Efficiency Cooking - Meals in a Snap   Instruction Review Code 1- Verbalizes Understanding   Class Start Time 1140   Class Stop Time 1220   Class Time Calculation (min) 40 min    Row Name 09/28/22 1200     Education   Cardiac Education Topics Pritikin   [employer] Workshops     Workshops   Educator Exercise Physiologist   [facility] Psychosocial   Psychosocial Workshop Focused Goals, Sustainable Changes   Instruction Review Code 1- Verbalizes Understanding   Class Start Time 1135   Class Stop Time 1210   Class Time Calculation (min) 35 min    Row Name 09/30/22 1200     Education  Cardiac Education Topics Pritikin   Orthoptist   Educator Dietitian   Weekly Topic One-Pot Wonders   Instruction Review Code 1- Verbalizes Understanding   Class Start Time 1140   Class Stop Time 1211   Class Time Calculation (min) 31 min    Row Name 10/02/22 1200     Education   Cardiac Education Topics Pritikin   Chief of Staff General Education   General Education Hypertension and Heart Disease   Instruction Review Code 1- Verbalizes Understanding   Class Start Time 1141   Class Stop Time 1217   Class Time Calculation (min) 36 min    Row Name 10/05/22 1200     Education   Cardiac Education Topics Pritikin   Select Core Videos     Core Videos   Educator Exercise Physiologist   Select Exercise Education   Exercise Education Biomechanial Limitations   Instruction Review Code 1- Verbalizes Understanding   Class Start Time 1150   Class Stop Time 1230   Class Time Calculation (min) 40 min    Row Name 10/12/22 1600     Education   Cardiac Education Topics Pritikin   Nurse, children's   Educator Dietitian   Select Nutrition   Nutrition Facts on Fat   Instruction Review Code 1- Verbalizes Understanding   Class Start Time 1150   Class Stop Time 1222   Class Time Calculation (min) 32 min    Row Name 10/14/22 1300     Education   Cardiac Education Topics Pritikin   Customer service manager   Weekly Topic Fast Evening Meals   Instruction Review Code 1- Verbalizes Understanding   Class Start Time 1138   Class Stop Time 1216   Class Time Calculation (min) 38 min            Core Videos: Exercise    Move It!  Clinical staff conducted group or individual video education with verbal and written material and guidebook.  Patient learns the recommended Pritikin exercise program. Exercise with the goal of living a long, healthy life. Some of the health benefits of exercise include controlled diabetes, healthier blood pressure levels, improved cholesterol levels, improved heart and lung capacity, improved sleep, and better body composition. Everyone should speak with their doctor before starting or changing an exercise routine.  Biomechanical Limitations Clinical staff conducted group or individual video education with  verbal and written material and guidebook.  Patient learns how biomechanical limitations can impact exercise and how we can mitigate and possibly overcome limitations to have an impactful and balanced exercise routine.  Body Composition Clinical staff conducted group or individual video education with verbal and written material and guidebook.  Patient learns that body composition (ratio of muscle mass to fat mass) is a key component to assessing overall fitness, rather than body weight alone. Increased fat mass, especially visceral belly fat, can put [place] at increased risk for metabolic syndrome, type 2 diabetes, heart disease, and even death. It is recommended to combine diet and exercise (cardiovascular and resistance training) to improve your body composition. Seek guidance from your physician and exercise physiologist before implementing an exercise routine.  Exercise Action Plan Clinical staff conducted group or individual video education with verbal and written material and guidebook.  Patient learns  the recommended strategies to achieve and enjoy long-term exercise adherence, including variety, self-motivation, self-efficacy, and positive decision making. Benefits of exercise include fitness, good health, weight management, more energy, better sleep, less stress, and overall well-being.  Medical   Heart Disease Risk Reduction Clinical staff conducted group or individual video education with verbal and written material and guidebook.  Patient learns our heart is our most vital organ as it circulates oxygen, nutrients, white blood cells, and hormones throughout the entire body, and carries waste away. Data supports a plant-based eating plan like the Pritikin Program for its effectiveness in slowing progression of and reversing heart disease. The video provides a number of recommendations to address heart disease.   Metabolic Syndrome and Belly Fat  Clinical staff conducted group or individual  video education with verbal and written material and guidebook.  Patient learns what metabolic syndrome is, how it leads to heart disease, and how one can reverse it and keep it from coming back. You have metabolic syndrome if you have 3 of the following 5 criteria: abdominal obesity, high blood pressure, high triglycerides, low HDL cholesterol, and high blood sugar.  Hypertension and Heart Disease Clinical staff conducted group or individual video education with verbal and written material and guidebook.  Patient learns that high blood pressure, or hypertension, is very common in the [place]. Hypertension is largely due to excessive salt intake, but other important risk factors include being overweight, physical inactivity, drinking too much alcohol, smoking, and not eating enough potassium from fruits and vegetables. High blood pressure is a leading risk factor for heart attack, stroke, congestive heart failure, dementia, kidney failure, and premature death. Long-term effects of excessive salt intake include stiffening of the arteries and thickening of heart muscle and organ damage. Recommendations include ways to reduce hypertension and the risk of heart disease.  Diseases of Our Time - Focusing on Diabetes Clinical staff conducted group or individual video education with verbal and written material and guidebook.  Patient learns why the best way to stop diseases of our time is prevention, through food and other lifestyle changes. Medicine (such as prescription pills and surgeries) is often only a Band-Aid on the problem, not a long-term solution. Most common diseases of our time include obesity, type 2 diabetes, hypertension, heart disease, and cancer. The Pritikin Program is recommended and has been proven to help reduce, reverse, and/or prevent the damaging effects of metabolic syndrome.  Nutrition   Overview of the Pritikin Eating Plan  Clinical staff conducted group or individual video  education with verbal and written material and guidebook.  Patient learns about the Pritikin Eating Plan for disease risk reduction. The Pritikin Eating Plan emphasizes a wide variety of unrefined, minimally-processed carbohydrates, like fruits, vegetables, whole grains, and legumes. Go, Caution, and Stop food choices are explained. Plant-based and lean animal proteins are emphasized. Rationale provided for low sodium intake for blood pressure control, low added sugars for blood sugar stabilization, and low added fats and oils for coronary artery disease risk reduction and weight management.  Calorie Density  Clinical staff conducted group or individual video education with verbal and written material and guidebook.  Patient learns about calorie density and how it impacts the Pritikin Eating Plan. Knowing the characteristics of the food you choose will help you decide whether those foods will lead to weight gain or weight loss, and whether you want to consume more or less of them. Weight loss is usually a side effect of the Pritikin Eating Plan  because of its focus on low calorie-dense foods.  Label Reading  Clinical staff conducted group or individual video education with verbal and written material and guidebook.  Patient learns about the Pritikin recommended label reading guidelines and corresponding recommendations regarding calorie density, added sugars, sodium content, and whole grains.  Dining Out - Part 1  Clinical staff conducted group or individual video education with verbal and written material and guidebook.  Patient learns that restaurant meals can be sabotaging because they can be so high in calories, fat, sodium, and/or sugar. Patient learns recommended strategies on how to positively address this and avoid unhealthy pitfalls.  Facts on Fats  Clinical staff conducted group or individual video education with verbal and written material and guidebook.  Patient learns that lifestyle  modifications can be just as effective, if not more so, as many medications for lowering your risk of heart disease. A Pritikin lifestyle can help to reduce your risk of inflammation and atherosclerosis (cholesterol build-up, or plaque, in the artery walls). Lifestyle interventions such as dietary choices and physical activity address the cause of atherosclerosis. A review of the types of fats and their impact on blood cholesterol levels, along with dietary recommendations to reduce fat intake is also included.  Nutrition Action Plan  Clinical staff conducted group or individual video education with verbal and written material and guidebook.  Patient learns how to incorporate Pritikin recommendations into their lifestyle. Recommendations include planning and keeping personal health goals in mind as an important part of their success.  Healthy Mind-Set    Healthy Minds, Bodies, Hearts  Clinical staff conducted group or individual video education with verbal and written material and guidebook.  Patient learns how to identify when they are stressed. Video will discuss the impact of that stress, as well as the many benefits of stress management. Patient will also be introduced to stress management techniques. The way we think, act, and feel has an impact on our hearts.  How Our Thoughts Can Heal Our Hearts  Clinical staff conducted group or individual video education with verbal and written material and guidebook.  Patient learns that negative thoughts can cause depression and anxiety. This can result in negative lifestyle behavior and serious health problems. Cognitive behavioral therapy is an effective method to help control our thoughts in order to change and improve our emotional outlook.  Additional Videos:  Exercise    Improving Performance  Clinical staff conducted group or individual video education with verbal and written material and guidebook.  Patient learns to use a non-linear approach  by alternating intensity levels and lengths of time spent exercising to help burn more calories and lose more body fat. Cardiovascular exercise helps improve heart health, metabolism, hormonal balance, blood sugar control, and recovery from fatigue. Resistance training improves strength, endurance, balance, coordination, reaction time, metabolism, and muscle mass. Flexibility exercise improves circulation, posture, and balance. Seek guidance from your physician and exercise physiologist before implementing an exercise routine and learn your capabilities and proper form for all exercise.  Introduction to Yoga  Clinical staff conducted group or individual video education with verbal and written material and guidebook.  Patient learns about yoga, a discipline of the coming together of mind, breath, and body. The benefits of yoga include improved flexibility, improved range of motion, better posture and core strength, increased lung function, weight loss, and positive self-image. Yoga's heart health benefits include lowered blood pressure, healthier heart rate, decreased cholesterol and triglyceride levels, improved immune function, and reduced stress. Seek  guidance from your physician and exercise physiologist before implementing an exercise routine and learn your capabilities and proper form for all exercise.  Medical   Aging: Enhancing Your Quality of Life  Clinical staff conducted group or individual video education with verbal and written material and guidebook.  Patient learns key strategies and recommendations to stay in good physical health and enhance quality of life, such as prevention strategies, having an advocate, securing a Health Care Proxy and Power of Attorney, and keeping a list of medications and system for tracking them. It also discusses how to avoid risk for bone loss.  Biology of Weight Control  Clinical staff conducted group or individual video education with verbal and written  material and guidebook.  Patient learns that weight gain occurs because we consume more calories than we burn (eating more, moving less). Even if your body weight is normal, you may have higher ratios of fat compared to muscle mass. Too much body fat puts you at increased risk for cardiovascular disease, heart attack, stroke, type 2 diabetes, and obesity-related cancers. In addition to exercise, following the Pritikin Eating Plan can help reduce your risk.  Decoding Lab Results  Clinical staff conducted group or individual video education with verbal and written material and guidebook.  Patient learns that lab test reflects one measurement whose values change over time and are influenced by many factors, including medication, stress, sleep, exercise, food, hydration, pre-existing medical conditions, and more. It is recommended to use the knowledge from this video to become more involved with your lab results and evaluate your numbers to speak with your doctor.   Diseases of Our Time - Overview  Clinical staff conducted group or individual video education with verbal and written material and guidebook.  Patient learns that according to the CDC, 50% to 70% of chronic diseases (such as obesity, type 2 diabetes, elevated lipids, hypertension, and heart disease) are avoidable through lifestyle improvements including healthier food choices, listening to satiety cues, and increased physical activity.  Sleep Disorders Clinical staff conducted group or individual video education with verbal and written material and guidebook.  Patient learns how good quality and duration of sleep are important to overall health and well-being. Patient also learns about sleep disorders and how they impact health along with recommendations to address them, including discussing with a physician.  Nutrition  Dining Out - Part 2 Clinical staff conducted group or individual video education with verbal and written material and  guidebook.  Patient learns how to plan ahead and communicate in order to maximize their dining experience in a healthy and nutritious manner. Included are recommended food choices based on the type of restaurant the patient is visiting.   Fueling a Banker conducted group or individual video education with verbal and written material and guidebook.  There is a strong connection between our food choices and our health. Diseases like obesity and type 2 diabetes are very prevalent and are in large-part due to lifestyle choices. The Pritikin Eating Plan provides plenty of food and hunger-curbing satisfaction. It is easy to follow, affordable, and helps reduce health risks.  Menu Workshop  Clinical staff conducted group or individual video education with verbal and written material and guidebook.  Patient learns that restaurant meals can sabotage health goals because they are often packed with calories, fat, sodium, and sugar. Recommendations include strategies to plan ahead and to communicate with the manager, chef, or server to help order a healthier meal.  Planning Your  Eating Strategy  Clinical staff conducted group or individual video education with verbal and written material and guidebook.  Patient learns about the Pritikin Eating Plan and its benefit of reducing the risk of disease. The Pritikin Eating Plan does not focus on calories. Instead, it emphasizes high-quality, nutrient-rich foods. By knowing the characteristics of the foods, we choose, we can determine their calorie density and make informed decisions.  Targeting Your Nutrition Priorities  Clinical staff conducted group or individual video education with verbal and written material and guidebook.  Patient learns that lifestyle habits have a tremendous impact on disease risk and progression. This video provides eating and physical activity recommendations based on your personal health goals, such as reducing LDL  cholesterol, losing weight, preventing or controlling type 2 diabetes, and reducing high blood pressure.  Vitamins and Minerals  Clinical staff conducted group or individual video education with verbal and written material and guidebook.  Patient learns different ways to obtain key vitamins and minerals, including through a recommended healthy diet. It is important to discuss all supplements you take with your doctor.   Healthy Mind-Set    Smoking Cessation  Clinical staff conducted group or individual video education with verbal and written material and guidebook.  Patient learns that cigarette smoking and tobacco addiction pose a serious health risk which affects millions of people. Stopping smoking will significantly reduce the risk of heart disease, lung disease, and many forms of cancer. Recommended strategies for quitting are covered, including working with your doctor to develop a successful plan.  Culinary   Becoming a Set designer conducted group or individual video education with verbal and written material and guidebook.  Patient learns that cooking at home can be healthy, cost-effective, quick, and puts them in control. Keys to cooking healthy recipes will include looking at your recipe, assessing your equipment needs, planning ahead, making it simple, choosing cost-effective seasonal ingredients, and limiting the use of added fats, salts, and sugars.  Cooking - Breakfast and Snacks  Clinical staff conducted group or individual video education with verbal and written material and guidebook.  Patient learns how important breakfast is to satiety and nutrition through the entire day. Recommendations include key foods to eat during breakfast to help stabilize blood sugar levels and to prevent overeating at meals later in the day. Planning ahead is also a key component.  Cooking - Educational psychologist conducted group or individual video education with verbal  and written material and guidebook.  Patient learns eating strategies to improve overall health, including an approach to cook more at home. Recommendations include thinking of animal protein as a side on your plate rather than center stage and focusing instead on lower calorie dense options like vegetables, fruits, whole grains, and plant-based proteins, such as beans. Making sauces in large quantities to freeze for later and leaving the skin on your vegetables are also recommended to maximize your experience.  Cooking - Healthy Salads and Dressing Clinical staff conducted group or individual video education with verbal and written material and guidebook.  Patient learns that vegetables, fruits, whole grains, and legumes are the foundations of the Pritikin Eating Plan. Recommendations include how to incorporate each of these in flavorful and healthy salads, and how to create homemade salad dressings. Proper handling of ingredients is also covered. Cooking - Soups and [employer] - Soups and Desserts Clinical staff conducted group or individual video education with verbal and written material and guidebook.  Patient  learns that Pritikin soups and desserts make for easy, nutritious, and delicious snacks and meal components that are low in sodium, fat, sugar, and calorie density, while high in vitamins, minerals, and filling fiber. Recommendations include simple and healthy ideas for soups and desserts.   Overview     The Pritikin Solution Program Overview Clinical staff conducted group or individual video education with verbal and written material and guidebook.  Patient learns that the results of the Pritikin Program have been documented in more than 100 articles published in peer-reviewed journals, and the benefits include reducing risk factors for (and, in some cases, even reversing) high cholesterol, high blood pressure, type 2 diabetes, obesity, and more! An overview of the three key pillars  of the Pritikin Program will be covered: eating well, doing regular exercise, and having a healthy mind-set.  WORKSHOPS  Exercise: Exercise Basics: Building Your Action Plan Clinical staff led group instruction and group discussion with PowerPoint presentation and patient guidebook. To enhance the learning environment the use of posters, models and videos may be added. At the conclusion of this workshop, patients will comprehend the difference between physical activity and exercise, as well as the benefits of incorporating both, into their routine. Patients will understand the FITT (Frequency, Intensity, Time, and Type) principle and how to use it to build an exercise action plan. In addition, safety concerns and other considerations for exercise and cardiac rehab will be addressed by the presenter. The purpose of this lesson is to promote a comprehensive and effective weekly exercise routine in order to improve patients' overall level of fitness.   Managing Heart Disease: Your Path to a Healthier Heart Clinical staff led group instruction and group discussion with PowerPoint presentation and patient guidebook. To enhance the learning environment the use of posters, models and videos may be added.At the conclusion of this workshop, patients will understand the anatomy and physiology of the heart. Additionally, they will understand how Pritikin's three pillars impact the risk factors, the progression, and the management of heart disease.  The purpose of this lesson is to provide a high-level overview of the heart, heart disease, and how the Pritikin lifestyle positively impacts risk factors.  Exercise Biomechanics Clinical staff led group instruction and group discussion with PowerPoint presentation and patient guidebook. To enhance the learning environment the use of posters, models and videos may be added. Patients will learn how the structural parts of their bodies function and how these  functions impact their daily activities, movement, and exercise. Patients will learn how to promote a neutral spine, learn how to manage pain, and identify ways to improve their physical movement in order to promote healthy living. The purpose of this lesson is to expose patients to common physical limitations that impact physical activity. Participants will learn practical ways to adapt and manage aches and pains, and to minimize their effect on regular exercise. Patients will learn how to maintain good posture while sitting, walking, and lifting.  Balance Training and Fall Prevention  Clinical staff led group instruction and group discussion with PowerPoint presentation and patient guidebook. To enhance the learning environment the use of posters, models and videos may be added. At the conclusion of this workshop, patients will understand the importance of their sensorimotor skills (vision, proprioception, and the vestibular system) in maintaining their ability to balance as they age. Patients will apply a variety of balancing exercises that are appropriate for their current level of function. Patients will understand the common causes for poor balance, possible  solutions to these problems, and ways to modify their physical environment in order to minimize their fall risk. The purpose of this lesson is to teach patients about the importance of maintaining balance as they age and ways to minimize their risk of falling.  WORKSHOPS   Nutrition:  Fueling a Ship broker led group instruction and group discussion with PowerPoint presentation and patient guidebook. To enhance the learning environment the use of posters, models and videos may be added. Patients will review the foundational principles of the Pritikin Eating Plan and understand what constitutes a serving size in each of the food groups. Patients will also learn Pritikin-friendly foods that are better choices when away from  home and review make-ahead meal and snack options. Calorie density will be reviewed and applied to three nutrition priorities: weight maintenance, weight loss, and weight gain. The purpose of this lesson is to reinforce (in a group setting) the key concepts around what patients are recommended to eat and how to apply these guidelines when away from home by planning and selecting Pritikin-friendly options. Patients will understand how calorie density may be adjusted for different weight management goals.  Mindful Eating  Clinical staff led group instruction and group discussion with PowerPoint presentation and patient guidebook. To enhance the learning environment the use of posters, models and videos may be added. Patients will briefly review the concepts of the Pritikin Eating Plan and the importance of low-calorie dense foods. The concept of mindful eating will be introduced as well as the importance of paying attention to internal hunger signals. Triggers for non-hunger eating and techniques for dealing with triggers will be explored. The purpose of this lesson is to provide patients with the opportunity to review the basic principles of the Pritikin Eating Plan, discuss the value of eating mindfully and how to measure internal cues of hunger and fullness using the Hunger Scale. Patients will also discuss reasons for non-hunger eating and learn strategies to use for controlling emotional eating.  Targeting Your Nutrition Priorities Clinical staff led group instruction and group discussion with PowerPoint presentation and patient guidebook. To enhance the learning environment the use of posters, models and videos may be added. Patients will learn how to determine their genetic susceptibility to disease by reviewing their family history. Patients will gain insight into the importance of diet as part of an overall healthy lifestyle in mitigating the impact of genetics and other environmental insults. The  purpose of this lesson is to provide patients with the opportunity to assess their personal nutrition priorities by looking at their family history, their own health history and current risk factors. Patients will also be able to discuss ways of prioritizing and modifying the Pritikin Eating Plan for their highest risk areas  Menu  Clinical staff led group instruction and group discussion with PowerPoint presentation and patient guidebook. To enhance the learning environment the use of posters, models and videos may be added. Using menus brought in from [employer], or printed from [employer], patients will apply the Pritikin dining out guidelines that were presented in the [employer] video. Patients will also be able to practice these guidelines in a variety of provided scenarios. The purpose of this lesson is to provide patients with the opportunity to practice hands-on learning of the Pritikin Dining Out guidelines with actual menus and practice scenarios.  Label Reading Clinical staff led group instruction and group discussion with PowerPoint presentation and patient guidebook. To enhance the learning environment the  use of posters, models and videos may be added. Patients will review and discuss the Pritikin label reading guidelines presented in Pritikin's Label Reading Educational series video. Using fool labels brought in from local grocery stores and markets, patients will apply the label reading guidelines and determine if the packaged food meet the Pritikin guidelines. The purpose of this lesson is to provide patients with the opportunity to review, discuss, and practice hands-on learning of the Pritikin Label Reading guidelines with actual packaged food labels. Cooking School  Pritikin's [employer] are designed to teach patients ways to prepare quick, simple, and affordable recipes at home. The importance of nutrition's role in chronic disease risk  reduction is reflected in its emphasis in the overall Pritikin program. By learning how to prepare essential core Pritikin Eating Plan recipes, patients will increase control over what they eat; be able to customize the flavor of foods without the use of added salt, sugar, or fat; and improve the quality of the food they consume. By learning a set of core recipes which are easily assembled, quickly prepared, and affordable, patients are more likely to prepare more healthy foods at home. These workshops focus on convenient breakfasts, simple entres, side dishes, and desserts which can be prepared with minimal effort and are consistent with nutrition recommendations for cardiovascular risk reduction. Cooking [employer] are taught by a Armed forces logistics/support/administrative officer (RD) who has been trained by the [employer]. The chef or RD has a clear understanding of the importance of minimizing - if not completely eliminating - added fat, sugar, and sodium in recipes. Throughout the series of Cooking School Workshop sessions, patients will learn about healthy ingredients and efficient methods of cooking to build confidence in their capability to prepare    Cooking School weekly topics:  Adding Flavor- Sodium-Free  Fast and Healthy Breakfasts  Powerhouse Plant-Based Proteins  Satisfying Salads and Dressings  Simple Sides and Sauces  International Cuisine-Spotlight on the [employer] Zones  Delicious Desserts  Savory Soups  [employer] - Meals in a Astronomer Appetizers and Snacks  Comforting Weekend Breakfasts  One-Pot Wonders   Fast Evening Meals  Landscape architect Your Pritikin Plate  WORKSHOPS   Healthy Mindset (Psychosocial):  Focused Goals, Sustainable Changes Clinical staff led group instruction and group discussion with PowerPoint presentation and patient guidebook. To enhance the learning environment the use of posters, models and videos may be added. Patients  will be able to apply effective goal setting strategies to establish at least one personal goal, and then take consistent, meaningful action toward that goal. They will learn to identify common barriers to achieving personal goals and develop strategies to overcome them. Patients will also gain an understanding of how our mind-set can impact our ability to achieve goals and the importance of cultivating a positive and growth-oriented mind-set. The purpose of this lesson is to provide patients with a deeper understanding of how to set and achieve personal goals, as well as the tools and strategies needed to overcome common obstacles which may arise along the way.  From Head to Heart: The Power of a Healthy Outlook  Clinical staff led group instruction and group discussion with PowerPoint presentation and patient guidebook. To enhance the learning environment the use of posters, models and videos may be added. Patients will be able to recognize and describe the impact of emotions and mood on physical health. They will discover the importance of self-care and explore self-care practices which  may work for them. Patients will also learn how to utilize the 4 C's to cultivate a healthier outlook and better manage stress and challenges. The purpose of this lesson is to demonstrate to patients how a healthy outlook is an essential part of maintaining good health, especially as they continue their cardiac rehab journey.  Healthy Sleep for a Healthy Heart Clinical staff led group instruction and group discussion with PowerPoint presentation and patient guidebook. To enhance the learning environment the use of posters, models and videos may be added. At the conclusion of this workshop, patients will be able to demonstrate knowledge of the importance of sleep to overall health, well-being, and quality of life. They will understand the symptoms of, and treatments for, common sleep disorders. Patients will also be able to  identify daytime and nighttime behaviors which impact sleep, and they will be able to apply these tools to help manage sleep-related challenges. The purpose of this lesson is to provide patients with a general overview of sleep and outline the importance of quality sleep. Patients will learn about a few of the most common sleep disorders. Patients will also be introduced to the concept of "sleep hygiene," and discover ways to self-manage certain sleeping problems through simple daily behavior changes. Finally, the workshop will motivate patients by clarifying the links between quality sleep and their goals of heart-healthy living.   Recognizing and Reducing Stress Clinical staff led group instruction and group discussion with PowerPoint presentation and patient guidebook. To enhance the learning environment the use of posters, models and videos may be added. At the conclusion of this workshop, patients will be able to understand the types of stress reactions, differentiate between acute and chronic stress, and recognize the impact that chronic stress has on their health. They will also be able to apply different coping mechanisms, such as reframing negative self-talk. Patients will have the opportunity to practice a variety of stress management techniques, such as deep abdominal breathing, progressive muscle relaxation, and/or guided imagery.  The purpose of this lesson is to educate patients on the role of stress in their lives and to provide healthy techniques for coping with it.  Learning Barriers/Preferences:  Learning Barriers/Preferences - 08/11/22 1118       Learning Barriers/Preferences   Learning Barriers Sight    Learning Preferences Verbal Instruction;Video;Skilled Demonstration;Pictoral;Individual Instruction;Group Instruction;Computer/Internet             Education Topics:  Knowledge Questionnaire Score:  Knowledge Questionnaire Score - 08/11/22 1341       Knowledge  Questionnaire Score   Pre Score 19/24             Core Components/Risk Factors/Patient Goals at Admission:  Personal Goals and Risk Factors at Admission - 08/11/22 1116       Core Components/Risk Factors/Patient Goals on Admission   Heart Failure Yes    Intervention Provide a combined exercise and nutrition program that is supplemented with education, support and counseling about heart failure. Directed toward relieving symptoms such as shortness of breath, decreased exercise tolerance, and extremity edema.    Expected Outcomes Improve functional capacity of life;Short term: Attendance in program 2-3 days a week with increased exercise capacity. Reported lower sodium intake. Reported increased fruit and vegetable intake. Reports medication compliance.;Short term: Daily weights obtained and reported for increase. Utilizing diuretic protocols set by physician.;Long term: Adoption of self-care skills and reduction of barriers for early signs and symptoms recognition and intervention leading to self-care maintenance.    Hypertension Yes  Intervention Provide education on lifestyle modifcations including regular physical activity/exercise, weight management, moderate sodium restriction and increased consumption of fresh fruit, vegetables, and low fat dairy, alcohol moderation, and smoking cessation.;Monitor prescription use compliance.    Expected Outcomes Short Term: Continued assessment and intervention until BP is < 140/94mm HG in hypertensive participants. < 130/58mm HG in hypertensive participants with diabetes, heart failure or chronic kidney disease.;Long Term: Maintenance of blood pressure at goal levels.    Lipids Yes    Intervention Provide education and support for participant on nutrition & aerobic/resistive exercise along with prescribed medications to achieve LDL 70mg , HDL >40mg .    Expected Outcomes Short Term: Participant states understanding of desired cholesterol values and is  compliant with medications prescribed. Participant is following exercise prescription and nutrition guidelines.;Long Term: Cholesterol controlled with medications as prescribed, with individualized exercise RX and with personalized nutrition plan. Value goals: LDL < 70mg , HDL > 40 mg.             Core Components/Risk Factors/Patient Goals Review:   Goals and Risk Factor Review     Row Name 08/20/22 1604 08/25/22 1641 09/23/22 0859 10/20/22 1542       Core Components/Risk Factors/Patient Goals Review   Personal Goals Review Weight Management/Obesity;Lipids;Heart Failure;Hypertension Weight Management/Obesity;Lipids;Heart Failure;Hypertension Weight Management/Obesity;Lipids;Heart Failure;Hypertension;Diabetes Weight Management/Obesity;Lipids;Heart Failure;Hypertension;Diabetes    Review Trey Paula started intensive cardiac rehab on 08/19/22. Vital signs were stable. Trey Paula did well with exercise. Trey Paula started intensive cardiac rehab on 08/19/22. and is off to a good start to  exercise. Trey Paula returned to exercise on 09/21/22 after being absent for several days. Trey Paula is now taking metformin for diabetes. CBG's WNL. Trey Paula is checking his CBg's at home. Trey Paula is down 1.2 kg since starting cardiac rehab Trey Paula  continues to do  well with exercise. CBG's WNL. Trey Paula is checking his CBg's at home. Trey Paula is down 4.0  kg since starting cardiac rehab. Trey Paula will complete cardiac rehab on 11/06/22    Expected Outcomes Trey Paula will continue to participate in intensive cardiac rehab for exercise, nutrition and lifestyle modifications Trey Paula will continue to participate in intensive cardiac rehab for exercise, nutrition and lifestyle modifications Trey Paula will continue to participate in intensive cardiac rehab for exercise, nutrition and lifestyle modifications Trey Paula will continue to participate in intensive cardiac rehab for exercise, nutrition and lifestyle modifications             Core Components/Risk Factors/Patient Goals at  Discharge (Final Review):   Goals and Risk Factor Review - 10/20/22 1542       Core Components/Risk Factors/Patient Goals Review   Personal Goals Review Weight Management/Obesity;Lipids;Heart Failure;Hypertension;Diabetes    Review Trey Paula  continues to do  well with exercise. CBG's WNL. Trey Paula is checking his CBg's at home. Trey Paula is down 4.0  kg since starting cardiac rehab. Trey Paula will complete cardiac rehab on 11/06/22    Expected Outcomes Trey Paula will continue to participate in intensive cardiac rehab for exercise, nutrition and lifestyle modifications             ITP Comments:  ITP Comments     Row Name 08/11/22 5188 08/20/22 1555 08/25/22 1638 09/23/22 0849 10/20/22 1537   ITP Comments Armanda Magic, MD: Medical Director.  Introduction to the Pritikin Education Program/Intensive Cardiac Rehab.  Initial orientation packet reviewed with the patient. 30 Day ITP Review. Trey Paula started intensive cardiac rehab on 08/19/22 and did well with execise for his fitness level 30 Day ITP Review. Trey Paula started intensive cardiac rehab on 08/19/22 and  is off to a good start to exercise 30 Day ITP Review. Trey Paula returned to exercise 09/21/22 at cardiac rehab after being absent for several days. Did well with exercise 30 Day ITP Review. Trey Paula has good participation in cardiac rehab when in attendance            Comments: See ITP comments.

## 2022-10-21 ENCOUNTER — Encounter (HOSPITAL_COMMUNITY)
Admission: RE | Admit: 2022-10-21 | Discharge: 2022-10-21 | Disposition: A | Discharge status: Home / Self Care | Payer: 59 | Source: Ambulatory Visit | Attending: Cardiology | Admitting: Cardiology

## 2022-10-21 DIAGNOSIS — Z955 Presence of coronary angioplasty implant and graft: Secondary | ICD-10-CM | POA: Diagnosis not present

## 2022-10-23 ENCOUNTER — Encounter (HOSPITAL_COMMUNITY): Payer: No Typology Code available for payment source

## 2022-10-26 ENCOUNTER — Encounter (HOSPITAL_COMMUNITY)
Admission: RE | Admit: 2022-10-26 | Discharge: 2022-10-26 | Disposition: A | Discharge status: Home / Self Care | Payer: 59 | Source: Ambulatory Visit | Attending: Cardiology | Admitting: Cardiology

## 2022-10-26 DIAGNOSIS — I509 Heart failure, unspecified: Secondary | ICD-10-CM | POA: Insufficient documentation

## 2022-10-26 DIAGNOSIS — I110 Hypertensive heart disease with heart failure: Secondary | ICD-10-CM | POA: Diagnosis not present

## 2022-10-26 DIAGNOSIS — Z48812 Encounter for surgical aftercare following surgery on the circulatory system: Secondary | ICD-10-CM | POA: Diagnosis not present

## 2022-10-26 DIAGNOSIS — Z955 Presence of coronary angioplasty implant and graft: Secondary | ICD-10-CM | POA: Insufficient documentation

## 2022-10-28 ENCOUNTER — Encounter (HOSPITAL_COMMUNITY)
Admission: RE | Admit: 2022-10-28 | Discharge: 2022-10-28 | Disposition: A | Discharge status: Home / Self Care | Payer: 59 | Source: Ambulatory Visit | Attending: Cardiology | Admitting: Cardiology

## 2022-10-28 DIAGNOSIS — Z48812 Encounter for surgical aftercare following surgery on the circulatory system: Secondary | ICD-10-CM | POA: Diagnosis not present

## 2022-10-28 DIAGNOSIS — Z955 Presence of coronary angioplasty implant and graft: Secondary | ICD-10-CM

## 2022-10-30 ENCOUNTER — Encounter (HOSPITAL_COMMUNITY)
Admission: RE | Admit: 2022-10-30 | Discharge: 2022-10-30 | Disposition: A | Discharge status: Home / Self Care | Payer: 59 | Source: Ambulatory Visit | Attending: Cardiology | Admitting: Cardiology

## 2022-10-30 VITALS — Ht 68.5 in | Wt 198.6 lb

## 2022-10-30 DIAGNOSIS — Z48812 Encounter for surgical aftercare following surgery on the circulatory system: Secondary | ICD-10-CM | POA: Diagnosis not present

## 2022-10-30 DIAGNOSIS — Z955 Presence of coronary angioplasty implant and graft: Secondary | ICD-10-CM

## 2022-11-02 ENCOUNTER — Encounter (HOSPITAL_COMMUNITY)
Admission: RE | Admit: 2022-11-02 | Discharge: 2022-11-02 | Disposition: A | Discharge status: Home / Self Care | Payer: 59 | Source: Ambulatory Visit | Attending: Cardiology | Admitting: Cardiology

## 2022-11-02 DIAGNOSIS — Z955 Presence of coronary angioplasty implant and graft: Secondary | ICD-10-CM

## 2022-11-02 NOTE — Addendum Note (Signed)
Addended by: Vernard Gambles on: 11/02/2022 02:25 PM   Modules accepted: Orders

## 2022-11-03 ENCOUNTER — Encounter: Payer: Self-pay | Admitting: Cardiovascular Disease

## 2022-11-03 NOTE — Telephone Encounter (Signed)
Error

## 2022-11-04 ENCOUNTER — Telehealth: Payer: Self-pay

## 2022-11-04 ENCOUNTER — Encounter (HOSPITAL_COMMUNITY)
Admission: RE | Admit: 2022-11-04 | Discharge: 2022-11-04 | Disposition: A | Discharge status: Home / Self Care | Payer: 59 | Source: Ambulatory Visit | Attending: Cardiology | Admitting: Cardiology

## 2022-11-04 DIAGNOSIS — Z48812 Encounter for surgical aftercare following surgery on the circulatory system: Secondary | ICD-10-CM | POA: Diagnosis not present

## 2022-11-04 DIAGNOSIS — Z955 Presence of coronary angioplasty implant and graft: Secondary | ICD-10-CM

## 2022-11-04 NOTE — Telephone Encounter (Signed)
   Patient Name: Steven Brady  DOB: [birth year] MRN: [id number]  Primary Cardiologist: Verne Carrow, MD  Chart reviewed as part of pre-operative protocol coverage. Cataract extractions are recognized in guidelines as low risk surgeries that do not typically require specific preoperative testing or holding of blood thinner therapy. Therefore, given past medical history and time since last visit, based on ACC/AHA guidelines, [name] would be at acceptable risk for the planned procedure without further cardiovascular testing.   I will route this recommendation to the requesting party via Epic fax function and remove from pre-op pool.  Please call with questions.  Sharlene Dory, PA-C 11/04/2022, 8:15 AM

## 2022-11-04 NOTE — Telephone Encounter (Signed)
   Pre-operative Risk Assessment    Patient Name: Steven Brady  DOB: [birth year] MRN: [id number]      Request for Surgical Clearance    Procedure:   Cataract Extraction and Goniotomy- Left   Date of Surgery:  Clearance 11/12/22                                 Surgeon:  Dr. Osborne Oman. [facility] or Practice Name:  [facility] number:  [phone] Fax number:  [phone]   Type of Clearance Requested:   - Medical  - Pharmacy:  Hold Apixaban (Eliquis) Hold not requested but patient is on Eliquis   Type of Anesthesia:   IV   Additional requests/questions:    Garrel Ridgel   11/04/2022, 7:45 AM

## 2022-11-06 ENCOUNTER — Encounter (HOSPITAL_COMMUNITY)
Admission: RE | Admit: 2022-11-06 | Discharge: 2022-11-06 | Disposition: A | Discharge status: Home / Self Care | Payer: 59 | Source: Ambulatory Visit | Attending: Cardiology | Admitting: Cardiology

## 2022-11-06 DIAGNOSIS — Z48812 Encounter for surgical aftercare following surgery on the circulatory system: Secondary | ICD-10-CM | POA: Diagnosis not present

## 2022-11-06 DIAGNOSIS — Z955 Presence of coronary angioplasty implant and graft: Secondary | ICD-10-CM

## 2022-11-09 NOTE — Telephone Encounter (Signed)
[facility] is calling requesting clearance be sent again, but with following fax #.   Fax # [phone]

## 2022-11-09 NOTE — Telephone Encounter (Signed)
I will re-fax clearance notes. Error on our end as the ph and fax # for the requesting office were entered incorrectly.

## 2022-11-09 NOTE — Telephone Encounter (Signed)
I s/w Deanna at [facility] as to re-fax the clearance notes. Deanna did state they are also adjusting to receiving faxes electronically, which that fax # will be [phone]. Their regular fax machine [phone] is still working as well. I have faxed notes to both fax #'s today.

## 2022-11-09 NOTE — Progress Notes (Signed)
Discharge Progress Report  Patient Details  Name: Steven Brady MRN: [id number] Date of Birth: [birth year] Referring Provider:   Flowsheet Row INTENSIVE CARDIAC REHAB ORIENT from 08/11/2022 in [facility]  Referring Provider Dr. Cherly Anderson (Armanda Magic, MD covering)        Number of Visits: 60  Reason for Discharge:  Patient reached a stable level of exercise. Patient independent in their exercise. Patient has met program and personal goals.  Smoking History:  Social History   Tobacco Use  Smoking Status Light Smoker   Current packs/day: 0.25   Types: Cigarettes  Smokeless Tobacco Never    Diagnosis:  07/13/22 S/P coronary artery stent placement RCA x 3  ADL [facility]SD:   Initial Exercise Prescription:  Initial Exercise Prescription - 08/11/22 1300       Date of Initial Exercise RX and Referring Provider   Date 08/11/22    Referring Provider Dr. Cherly Anderson Armanda Magic, MD covering)    Expected Discharge Date 10/23/22      NuStep   Level 1    SPM 80    Minutes 15    METs 2.6      Arm Ergometer   Level 1    Watts 25    RPM 60    Minutes 15    METs 2.6      Prescription Details   Frequency (times per week) 3    Duration Progress to 30 minutes of continuous aerobic without signs/symptoms of physical distress      Intensity   THRR 40-80% of Max Heartrate 62-124    Ratings of Perceived Exertion 11-13    Perceived Dyspnea 0-4      Progression   Progression Continue progressive overload as per policy without signs/symptoms or physical distress.      Resistance Training   Training Prescription Yes    Weight 3 lbs    Reps 10-15             Discharge Exercise Prescription (Final Exercise Prescription Changes):  Exercise Prescription Changes - 11/06/22 1500       Response to Exercise   Blood Pressure (Admit) 124/68    Blood Pressure (Exercise) 166/90    Blood Pressure (Exit) 130/80    Heart  Rate (Admit) 63 bpm    Heart Rate (Exercise) 97 bpm    Heart Rate (Exit) 72 bpm    Rating of Perceived Exertion (Exercise) 10.5    Symptoms None    Comments Pt graduated from the CRP2 program today    Duration Continue with 30 min of aerobic exercise without signs/symptoms of physical distress.    Intensity THRR unchanged      Progression   Progression Continue to progress workloads to maintain intensity without signs/symptoms of physical distress.    Average METs 2.3      Resistance Training   Training Prescription Yes    Weight 4 lb wts    Reps 10-15    Time 10 Minutes      Interval Training   Interval Training No      NuStep   Level 4    SPM 115    Minutes 30    METs 2.6      Home Exercise Plan   Plans to continue exercise at Home (comment)    Frequency Add 4 additional days to program exercise sessions.    Initial Home Exercises Provided 09/30/22  Functional Capacity:  6 Minute Walk     Row Name 08/11/22 1110 10/30/22 1417       6 Minute Walk   Phase Initial  Pt used wheeled walker instead of his cane Discharge    Distance 944 feet  Pt used wheeled walker 1440 feet  used RW    Distance % Change -- 52.54 %    Distance Feet Change -- 496 ft    Walk Time 6 minutes 6 minutes    # of Rest Breaks 0 0    MPH 1.8 2.7    METS 2.6 3.2    RPE 7 11    Perceived Dyspnea  0 0    VO2 Peak 8.95 11.3    Symptoms No No    Resting HR 78 bpm 65 bpm    Resting BP 100/72 140/70    Resting Oxygen Saturation  98 % --    Exercise Oxygen Saturation  during 6 min walk 97 % --    Max Ex. HR 114 bpm 101 bpm    Max Ex. BP 140/82 184/86    2 Minute Post BP 124/70 170/80             Psychological, QOL, Others - Outcomes: PHQ 2/9:    11/06/2022    1:21 PM 08/11/2022   11:09 AM 07/30/2014    9:14 AM 07/16/2014    2:15 PM 07/10/2014   12:57 PM  Depression screen PHQ 2/9  Decreased Interest 0 1 0 1 1  Down, Depressed, Hopeless 0 1 2 1 2   PHQ - 2 Score 0 2 2 2  3   Altered sleeping 1 1 2     Tired, decreased energy 0 1 2    Change in appetite 0 1 1    Feeling bad or failure about yourself  0 0 2    Trouble concentrating 0 0 2    Moving slowly or fidgety/restless 1 0 2    Suicidal thoughts 0 0 0    PHQ-9 Score 2 5 13     Difficult doing work/chores Not difficult at all Not difficult at all Somewhat difficult      Quality of Life:  Quality of Life - 11/10/22 1203       Quality of Life Scores   Health/Function Pre 20.7 %    Health/Function Post 28.4 %    Health/Function % Change 37.2 %    Socioeconomic Pre 23.5 %    Socioeconomic Post 28.94 %    Socioeconomic % Change  23.15 %    Psych/Spiritual Pre 24.83 %    Psych/Spiritual Post 28.79 %    Psych/Spiritual % Change 15.95 %    Family Pre 30 %    Family Post 30 %    Family % Change 0 %    GLOBAL Pre 23.17 %    GLOBAL Post 28.83 %    GLOBAL % Change 24.43 %             Personal Goals: Goals established at orientation with interventions provided to work toward goal.  Personal Goals and Risk Factors at Admission - 08/11/22 1116       Core Components/Risk Factors/Patient Goals on Admission   Heart Failure Yes    Intervention Provide a combined exercise and nutrition program that is supplemented with education, support and counseling about heart failure. Directed toward relieving symptoms such as shortness of breath, decreased exercise tolerance, and extremity edema.    Expected Outcomes Improve functional capacity  of life;Short term: Attendance in program 2-3 days a week with increased exercise capacity. Reported lower sodium intake. Reported increased fruit and vegetable intake. Reports medication compliance.;Short term: Daily weights obtained and reported for increase. Utilizing diuretic protocols set by physician.;Long term: Adoption of self-care skills and reduction of barriers for early signs and symptoms recognition and intervention leading to self-care maintenance.     Hypertension Yes    Intervention Provide education on lifestyle modifcations including regular physical activity/exercise, weight management, moderate sodium restriction and increased consumption of fresh fruit, vegetables, and low fat dairy, alcohol moderation, and smoking cessation.;Monitor prescription use compliance.    Expected Outcomes Short Term: Continued assessment and intervention until BP is < 140/75mm HG in hypertensive participants. < 130/62mm HG in hypertensive participants with diabetes, heart failure or chronic kidney disease.;Long Term: Maintenance of blood pressure at goal levels.    Lipids Yes    Intervention Provide education and support for participant on nutrition & aerobic/resistive exercise along with prescribed medications to achieve LDL 70mg , HDL >40mg .    Expected Outcomes Short Term: Participant states understanding of desired cholesterol values and is compliant with medications prescribed. Participant is following exercise prescription and nutrition guidelines.;Long Term: Cholesterol controlled with medications as prescribed, with individualized exercise RX and with personalized nutrition plan. Value goals: LDL < 70mg , HDL > 40 mg.              Personal Goals Discharge:  Goals and Risk Factor Review     Row Name 08/20/22 1604 08/25/22 1641 09/23/22 0859 10/20/22 1542 11/09/22 1248     Core Components/Risk Factors/Patient Goals Review   Personal Goals Review Weight Management/Obesity;Lipids;Heart Failure;Hypertension Weight Management/Obesity;Lipids;Heart Failure;Hypertension Weight Management/Obesity;Lipids;Heart Failure;Hypertension;Diabetes Weight Management/Obesity;Lipids;Heart Failure;Hypertension;Diabetes Weight Management/Obesity;Lipids;Heart Failure;Hypertension;Stress;Diabetes   Review Steven Brady started intensive cardiac rehab on 08/19/22. Vital signs were stable. Steven Brady did well with exercise. Steven Brady started intensive cardiac rehab on 08/19/22. and is off to a good  start to  exercise. Steven Brady returned to exercise on 09/21/22 after being absent for several days. Steven Brady is now taking metformin for diabetes. CBG's WNL. Steven Brady is checking his CBg's at home. Steven Brady is down 1.2 kg since starting cardiac rehab Steven Brady  continues to do  well with exercise. CBG's WNL. Steven Brady is checking his CBg's at home. Steven Brady is down 4.0  kg since starting cardiac rehab. Steven Brady will complete cardiac rehab on 11/06/22 Steven Brady graduated today, 11/06/2022 from the Intensive/Traditional CR program completing 36 total exercise and education sessions.  Core Component Goals: goal met for weight loss. Steven Brady has lost over 13# since starting the program. He has worked with our dietician on healthy eating habits. Goal met for blood pressure control. Steven Brady's BPs have been WNL while at rehab and he is compliant with his medication. Goal met for lipid control. Lipid profile WNL on lipid panel 06/15/22. Steven Brady is compliant with taking his lipid medication, atorvastatin. Goal also met for heart failure control. Steven Brady did not have a HF exacerbation while in the CR program. He is also compliant in taking all HF medications. Goal not met for diabetes control and A1C < 7%. Blood sugars during CR have been WNL but last A1C was 7.1%. For stress relief Steven Brady enjoys listening to music. He is a retired Technical sales engineer, and still plays the guitar. He also likes to cook and watch documentaries on TV.  Post-graduation Steven Brady plans to continue his exercise program by walking with his family. He also wants to enroll in the 1-year [facility] program for continuing education. We are proud  of the success Steven Brady has made in the program!   Expected Outcomes Steven Brady will continue to participate in intensive cardiac rehab for exercise, nutrition and lifestyle modifications Steven Brady will continue to participate in intensive cardiac rehab for exercise, nutrition and lifestyle modifications Steven Brady will continue to participate in intensive cardiac rehab for exercise, nutrition and  lifestyle modifications Steven Brady will continue to participate in intensive cardiac rehab for exercise, nutrition and lifestyle modifications Steven Brady will continue to exercise, modify his nutrition and lifestyle post CR graduation.            Exercise Goals and Review:  Exercise Goals     Row Name 08/11/22 1113             Exercise Goals   Increase Physical Activity Yes       Intervention Provide advice, education, support and counseling about physical activity/exercise needs.;Develop an individualized exercise prescription for aerobic and resistive training based on initial evaluation findings, risk stratification, comorbidities and participant's personal goals.       Expected Outcomes Short Term: Attend rehab on a regular basis to increase amount of physical activity.;Long Term: Add in home exercise to make exercise part of routine and to increase amount of physical activity.;Long Term: Exercising regularly at least 3-5 days a week.       Increase Strength and Stamina Yes       Intervention Provide advice, education, support and counseling about physical activity/exercise needs.;Develop an individualized exercise prescription for aerobic and resistive training based on initial evaluation findings, risk stratification, comorbidities and participant's personal goals.       Expected Outcomes Short Term: Increase workloads from initial exercise prescription for resistance, speed, and METs.;Short Term: Perform resistance training exercises routinely during rehab and add in resistance training at home;Long Term: Improve cardiorespiratory fitness, muscular endurance and strength as measured by increased METs and functional capacity ( )       Able to understand and use rate of perceived exertion (RPE) scale Yes       Intervention Provide education and explanation on how to use RPE scale       Expected Outcomes Short Term: Able to use RPE daily in rehab to express subjective intensity level;Long Term:   Able to use RPE to guide intensity level when exercising independently       Knowledge and understanding of Target Heart Rate Range (THRR) Yes       Intervention Provide education and explanation of THRR including how the numbers were predicted and where they are located for reference       Expected Outcomes Short Term: Able to state/look up THRR;Long Term: Able to use THRR to govern intensity when exercising independently;Short Term: Able to use daily as guideline for intensity in rehab       Understanding of Exercise Prescription Yes       Intervention Provide education, explanation, and written materials on patient's individual exercise prescription       Expected Outcomes Short Term: Able to explain program exercise prescription;Long Term: Able to explain home exercise prescription to exercise independently                Exercise Goals Re-Evaluation:  Exercise Goals Re-Evaluation     Row Name 08/19/22 1417 09/23/22 1413 11/04/22 1416 11/06/22 1500       Exercise Goal Re-Evaluation   Exercise Goals Review Increase Physical Activity;Increase Strength and Stamina;Able to understand and use rate of perceived exertion (RPE) scale;Knowledge and understanding of Target Heart Rate Range (  THRR);Understanding of Exercise Prescription Increase Physical Activity;Increase Strength and Stamina;Able to understand and use rate of perceived exertion (RPE) scale;Knowledge and understanding of Target Heart Rate Range (THRR);Understanding of Exercise Prescription Increase Physical Activity;Increase Strength and Stamina;Able to understand and use rate of perceived exertion (RPE) scale;Knowledge and understanding of Target Heart Rate Range (THRR);Understanding of Exercise Prescription Increase Physical Activity;Increase Strength and Stamina;Able to understand and use rate of perceived exertion (RPE) scale;Knowledge and understanding of Target Heart Rate Range (THRR);Understanding of Exercise Prescription     Comments Pt's first day in the CRP2 program. Pt understands the RPE scale, Exercise Rx, and THRR. Reviewed METs and goals. Peak METs to date are 2.7. Pt feels that that there has been some improvement in strength and stamina but it has not increased as much as he would like. Reviewed goals. Peak METs to date are 2.7. Pt feels that his streght and stamina have improve more over the last month. Pt voices he is walkilng better, and walks daily in the evening with his grilfriend. Getting back to walking and walking better are pt goals. Pt graduated from the CRP2 program today. Peak METs to date are 2.7. Pt feels that his strength and stamina have improve more over the last month. Pt voices he is walkilng better, and walks daily in the evening with his grilfriend. Getting back to walking and walking better are pt goals. Pt plans to continue exercisng at home by using the treadmill, walking outdoors and using the free weights.    Expected Outcomes Will continue to monitor patient and progress exercise workloads as tolerated. Will continue to monitor patient and progress exercise workloads as tolerated. Will continue to monitor patient and progress exercise workloads as tolerated. Pt will continue to exercise at home.             Nutrition & Weight - Outcomes:  Pre Biometrics - 08/11/22 0950       Pre Biometrics   Waist Circumference 44 inches    Hip Circumference 46 inches    Waist to Hip Ratio 0.96 %    Triceps Skinfold 10 mm    % Body Fat 29 %    Grip Strength 36 kg    Flexibility --   Not performed due to back problems   Single Leg Stand 2.35 seconds   High risk for fall            Post Biometrics - 10/30/22 1424        Post  Biometrics   Height 5' 8.5" (1.74 m)    Weight 90.1 kg    Waist Circumference 41 inches    Hip Circumference 44.5 inches    Waist to Hip Ratio 0.92 %    BMI (Calculated) 29.76    Triceps Skinfold 10 mm    % Body Fat 27 %    Grip Strength 38 kg     Flexibility --   Did not complete   Single Leg Stand 2.5 seconds             Nutrition:  Nutrition Therapy & Goals - 11/06/22 1335       Nutrition Therapy   Diet Heart healthy diet    Drug/Food Interactions Statins/Certain Fruits      Personal Nutrition Goals   Nutrition Goal Patient to identify strategies for reducing cardiovascular risk by attending the [facility] education and nutrition series weekly.   in action.   Personal Goal #2 Patient to improve diet quality by using the plate  method as a guide for meal planning to include lean protein/plant protein, fruits, vegetables, whole grains, nonfat dairy as part of a well-balanced diet.   in action.   Personal Goal #3 Patient to reduce sodium to 1500mg  per day   in action.   Comments Steven Brady graduates today. Goals in action. Steven Brady has attended the [facility] education and nutrition series regularly. He has started making many dietary changes including increased dietary fiber and decreased sodium intake. He is down 13.4# since starting with our program. His A1c has improved to 7.1. Patient will benefit from participation in intensive cardiac rehab for nutrition, exercise, and lifestyle modification.      Intervention Plan   Intervention Prescribe, educate and counsel regarding individualized specific dietary modifications aiming towards targeted core components such as weight, hypertension, lipid management, diabetes, heart failure and other comorbidities.;Nutrition handout(s) given to patient.    Expected Outcomes Short Term Goal: Understand basic principles of dietary content, such as calories, fat, sodium, cholesterol and nutrients.;Long Term Goal: Adherence to prescribed nutrition plan.             Nutrition Discharge:  Nutrition Assessments - 11/06/22 1435       Rate Your Plate Scores   Pre Score 58    Post Score 85             Education Questionnaire Score:  Knowledge Questionnaire Score - 11/10/22 1157        Knowledge Questionnaire Score   Post Score 22/24             Steven Brady graduated today, 11/06/2022 from the Intensive/Traditional CR program completing 36 total exercise and education sessions. Goals reviewed with patient; copy given to patient. Pt maintained good attendance and progressed during his participation in rehab as evidenced by increased METs and workload. Initial pt walked 944 feet. Post pt walked 1440 feet, a 52.54% increase. Initial PHQ-2/PHQ-9 score 2/5, post scores 0/2. Pt states his mental health is stable. RN reviewed his Quality-of-Life assessment, overall score increased from 23.17% to 28.83%. Scores above 19% indicating a positive quality of life. No areas of concern. Pt declines any needs at the time of graduation. RN also reviewed and updated medication list. Core component goals: goal met for weight loss. Steven Brady has lost over 13# since starting the program. He has worked with our dietician on healthy eating habits. Goal met for blood pressure control. Steven Brady's BPs have been WNL while at rehab and he is compliant with his medication. Goal met for lipid control. Lipid profile WNL on lipid panel 06/15/22. Steven Brady is compliant with taking his lipid medication, atorvastatin. Goal also met for heart failure control. Steven Brady did not have a HF exacerbation while in the CR program. He is also compliant in taking all HF medications. Goal not met for diabetes control and A1C < 7%. Blood sugars during CR have been WNL but last A1C was 7.1%. For stress relief Steven Brady enjoys listening to music. He is a retired Technical sales engineer, and still plays the guitar. He also likes to cook and watch documentaries on TV.  Post-graduation Steven Brady plans to continue his exercise program by walking with his family. He also wants to enroll in the 1-year [facility] program for continuing education. We are proud of the success Steven Brady has made in the program!

## 2022-11-11 ENCOUNTER — Other Ambulatory Visit: Payer: Self-pay | Admitting: Student

## 2022-11-15 ENCOUNTER — Ambulatory Visit (HOSPITAL_COMMUNITY)
Admission: EM | Admit: 2022-11-15 | Discharge: 2022-11-15 | Disposition: A | Discharge status: Home / Self Care | Payer: No Typology Code available for payment source

## 2022-11-15 NOTE — Progress Notes (Signed)
   11/15/22 1910  [facility] Triage Screening (Walk-ins at [facility] only)  How Did You Hear About [place]? Family/Friend  What Is the Reason for Your Visit/Call Today? Patient presetns to [facility] voluntasrily. Patient is frustrated with current partner for making him come to get triaged. Patient's partner was unwilling to offer any additional context to what brings patient in. Patient reports an argument happened at a wine tasting and he was called insentive which he doesnt believe he is. Patient denies SI/HI at this time. Patient reports wanting help through the situation with partner. Patient does not appear to be experiencing any mental health symtpoms at this time. Patient denies AVH at this time. Patient is routine.  How Long Has This Been Causing You Problems? <Week  Have You Recently Had Any Thoughts About Hurting Yourself? No  Are You Planning to Commit Suicide/Harm Yourself At This time? No  Have you Recently Had Thoughts About Hurting Someone [name]? No  Are You Planning To Harm Someone At This Time? No  Are you currently experiencing any auditory, visual or other hallucinations? No  Have You Used Any Alcohol or Drugs in the Past 24 Hours? Yes  How long ago did you use Drugs or Alcohol? about an hour ago  What Did You Use and How Much? a bottle of wine  Do you have any current medical co-morbidities that require immediate attention? No  Clinician description of patient physical appearance/behavior: Patient is frustrated and is semi cooperative. Patient is fairl groomed.  What Do You Feel Would Help You the Most Today? Treatment for Depression or other mood problem  If access to [facility] was not available, would you have sought care in the Emergency Department? Yes  Determination of Need Routine (7 days)  Options For Referral Outpatient Therapy

## 2022-11-15 NOTE — Discharge Instructions (Signed)

## 2022-11-15 NOTE — ED Provider Notes (Signed)
Patient presented to [facility] voluntarily. Patient is accompanied by his girfriend Steven Brady [phone]. Patient gave verbal consent for Ms. Johnson to be presence during interaction. Patient denies medical and psychiatric complaint. He says he is unsure why Ms. Laural Benes brought to [facility]. He denies SI/HI/hallucination/paranoia. He says he drinks alcohol occasionally-"few times in a month" and smokes 0.5-1 blunt per week. He denies all other drug use.     Ms Laural Benes denies safety concerns for patient. She says she wanted to be assessed however patient was registered accidentally. This NP offered to assess Ms. Johnson but she refused, stating "I'm going to come back tomorrow." She denies SI/HI. She says she is able to contract for safety.

## 2022-11-15 NOTE — Progress Notes (Signed)
   11/15/22 1910  [facility] Triage Screening (Walk-ins at [facility] only)  How Did You Hear About [place]? Family/Friend  What Is the Reason for Your Visit/Call Today? Patient presetns to [facility] voluntarily. Patient is frustrated with current partner for making him come to get triaged. Patient's partner was unwilling to offer any additional context to what brings patient in. Patient reports an argument happened at a wine tasting and he was called insentive which he doesnt believe he is. Patient denies SI/HI at this time. Patient reports wanting help through the situation with partner. Patient does not appear to be experiencing any mental health symtpoms at this time. Patient denies AVH at this time. Patient is routine.  How Long Has This Been Causing You Problems? <Week  Have You Recently Had Any Thoughts About Hurting Yourself? No  Are You Planning to Commit Suicide/Harm Yourself At This time? No  Have you Recently Had Thoughts About Hurting Someone [name]? No  Are You Planning To Harm Someone At This Time? No  Are you currently experiencing any auditory, visual or other hallucinations? No  Have You Used Any Alcohol or Drugs in the Past 24 Hours? Yes  How long ago did you use Drugs or Alcohol? about an hour ago  What Did You Use and How Much? a bottle of wine  Do you have any current medical co-morbidities that require immediate attention? Yes  Please describe current medical co-morbidities that require immediate attention: Patient reports recently having a stroke.  Clinician description of patient physical appearance/behavior: Patient is frustrated and is semi cooperative. Patient is fairl groomed.  What Do You Feel Would Help You the Most Today? Treatment for Depression or other mood problem  If access to [facility] was not available, would you have sought care in the Emergency Department? Yes  Determination of Need Routine (7 days)  Options For Referral Outpatient Therapy

## 2022-11-15 NOTE — ED Triage Notes (Addendum)
Patient presents to [facility] voluntarily. Patient is frustrated with current partner for making him come to get triaged. Patient's partner was unwilling to offer any additional context to what brings patient in. Patient reports an argument happened at a wine tasting and he was called insentive which he doesnt believe he is. Patient denies SI/HI at this time. Patient reports wanting help through the situation with partner. Patient does not appear to be experiencing any mental health symtpoms at this time. Patient denies AVH at this time. Patient is routine.

## 2022-11-19 ENCOUNTER — Ambulatory Visit: Payer: 59 | Attending: Cardiovascular Disease | Admitting: Cardiovascular Disease

## 2022-11-19 ENCOUNTER — Encounter: Payer: Self-pay | Admitting: Cardiovascular Disease

## 2022-11-19 VITALS — BP 138/80 | HR 70 | Ht 67.0 in | Wt 194.4 lb

## 2022-11-19 DIAGNOSIS — I5022 Chronic systolic (congestive) heart failure: Secondary | ICD-10-CM | POA: Diagnosis not present

## 2022-11-19 NOTE — Progress Notes (Signed)
Electrophysiology Office Note:    Date:  11/19/2022   ID:  Steven Brady, DOB 24-Nov-1955, MRN [id number]  PCP:  Filomena Jungling, NP   [facility] Providers Cardiologist:  Verne Carrow, MD     Referring MD: Laurann Montana, PA-C   History of Present Illness:    Steven Brady is a 67 y.o. male with a medical history significant for CHFrEF, coronary disease, left lateral branch block, nonsustained VT, presumed cardioembolic stroke in 2022, CKD 3 referred for possible BiV ICD placement.     He has a history of heart failure with reduced ejection fraction for at least 10 years.  He had a stroke in 2022 and was started on Eliquis for presumed cardio embolic etiology with an EF of 11%.  Referral notes indicate that he has had an abnormal coronary CT with multivessel coronary disease and a cardiac MRI which showed prior infarct of the inferior wall.  Coronary angiogram in April 2024 showed minimal LAD disease 35% marginal occlusion, 30% PDA, 70% stenosis of the distal circumflex, 70% P RCA lesions treated with stent.  BiV ICD was suggested, but the patient declined.     He reports today that he has been at baseline.  He has some chronic shortness of breath.  Edema is controlled  EKGs/Labs/Other Studies Reviewed Today:    Echocardiogram:  TTE September 26, 2022 EF 25 to 30% with global hypokinesis.  Grade 1 diastolic dysfunction.   Monitors:   Stress testing:    Advanced imaging:  Cardiac MRI April 2024 at [facility] Ejection fraction 38%.  Diffuse patchy scarring of the left ventricle wall, particularly the lateral wall, in a non- coronary disease pattern  Cardiac catherization    EKG:   EKG Interpretation Date/Time:  Thursday November 19 2022 11:16:22 EDT Ventricular Rate:  70 PR Interval:  158 QRS Duration:  166 QT Interval:  460 QTC Calculation: 496 R Axis:   11  Text Interpretation: Normal sinus rhythm Left bundle branch block When compared with ECG  of 25-Sep-2022 10:03, No significant change since last tracing Confirmed by York Pellant [phone]) on 11/19/2022 11:45:40 AM     Physical Exam:    VS:  BP 138/80   Pulse 70   Ht 5\' 7"  (1.702 m)   Wt 194 lb 6.4 oz (88.2 kg)   SpO2 97%   BMI 30.45 kg/m     Wt Readings from Last 3 Encounters:  11/19/22 194 lb 6.4 oz (88.2 kg)  10/30/22 198 lb 10.2 oz (90.1 kg)  10/07/22 202 lb (91.6 kg)     GEN: Well nourished, well developed in no acute distress CARDIAC: RRR, no murmurs, rubs, gallops RESPIRATORY:  Normal work of breathing MUSCULOSKELETAL: no edema    ASSESSMENT & PLAN:    CHFrEF, risk of sudden cardiac death Appears euvolemic and compensated today He has a long history of reduced ejection fraction which remains less than 30% despite GDMT for over 90 days NYHA II symptoms  I discussed the indication for the procedure and the logistics, risks, potential benefit, and after care. I specifically explained that risks include but are not limited to infection, bleeding,damage to blood vessels, lung, and the heart -- but risk of prolonged hospitalization, need for surgery, or the event of stroke, heart attack, or death are low but not zero.  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

## 2022-11-19 NOTE — Patient Instructions (Signed)
Medication Instructions:  Your physician recommends that you continue on your current medications as directed. Please refer to the Current Medication list given to you today. *If you need a refill on your cardiac medications before your next appointment, please call your pharmacy*   Testing/Procedures: BiV ICD Implant - Scheduled for September 10  Your physician has recommended that you have a defibrillator inserted. An implantable cardioverter defibrillator (ICD) is a small device that is placed in your chest or, in rare cases, your abdomen. This device uses electrical pulses or shocks to help control life-threatening, irregular heartbeats that could lead the heart to suddenly stop beating (sudden cardiac arrest). Leads are attached to the ICD that goes into your heart. This is done in the hospital and usually requires an overnight stay. Please see the instruction sheet given to you today for more information.   Follow-Up: At [facility], you and your health needs are our priority.  As part of our continuing mission to provide you with exceptional heart care, we have created designated Provider Care Teams.  These Care Teams include your primary Cardiologist (physician) and Advanced Practice Providers (APPs -  Physician Assistants and Nurse Practitioners) who all work together to provide you with the care you need, when you need it.  We recommend signing up for the patient portal called "MyChart".  Sign up information is provided on this After Visit Summary.  MyChart is used to connect with patients for Virtual Visits (Telemedicine).  Patients are able to view lab/test results, encounter notes, upcoming appointments, etc.  Non-urgent messages can be sent to your provider as well.   To learn more about what you can do with MyChart, go to [email or web address].    Your next appointment:   We will schedule follow up for you after your ICD implant  Provider:   York Pellant, MD

## 2022-11-27 ENCOUNTER — Telehealth (HOSPITAL_COMMUNITY): Payer: Self-pay | Admitting: Vascular Surgery

## 2022-11-27 NOTE — Telephone Encounter (Signed)
Lvm giving new chf appt

## 2022-12-08 ENCOUNTER — Other Ambulatory Visit: Payer: 59

## 2022-12-08 ENCOUNTER — Ambulatory Visit: Payer: 59 | Attending: Physician Assistant

## 2022-12-08 DIAGNOSIS — I447 Left bundle-branch block, unspecified: Secondary | ICD-10-CM

## 2022-12-08 DIAGNOSIS — I2510 Atherosclerotic heart disease of native coronary artery without angina pectoris: Secondary | ICD-10-CM

## 2022-12-08 DIAGNOSIS — I5022 Chronic systolic (congestive) heart failure: Secondary | ICD-10-CM

## 2022-12-08 DIAGNOSIS — Z8673 Personal history of transient ischemic attack (TIA), and cerebral infarction without residual deficits: Secondary | ICD-10-CM

## 2022-12-09 ENCOUNTER — Ambulatory Visit (HOSPITAL_COMMUNITY)
Admission: RE | Admit: 2022-12-09 | Discharge: 2022-12-09 | Disposition: A | Discharge status: Home / Self Care | Payer: 59 | Source: Ambulatory Visit | Attending: Cardiology | Admitting: Cardiology

## 2022-12-09 ENCOUNTER — Encounter (HOSPITAL_COMMUNITY): Payer: Self-pay | Admitting: Cardiology

## 2022-12-09 VITALS — BP 148/88 | HR 69 | Wt 200.4 lb

## 2022-12-09 DIAGNOSIS — I5022 Chronic systolic (congestive) heart failure: Secondary | ICD-10-CM | POA: Diagnosis present

## 2022-12-09 DIAGNOSIS — I255 Ischemic cardiomyopathy: Secondary | ICD-10-CM | POA: Diagnosis not present

## 2022-12-09 DIAGNOSIS — I428 Other cardiomyopathies: Secondary | ICD-10-CM | POA: Insufficient documentation

## 2022-12-09 DIAGNOSIS — Z7901 Long term (current) use of anticoagulants: Secondary | ICD-10-CM | POA: Diagnosis not present

## 2022-12-09 DIAGNOSIS — Z7902 Long term (current) use of antithrombotics/antiplatelets: Secondary | ICD-10-CM | POA: Insufficient documentation

## 2022-12-09 DIAGNOSIS — Z955 Presence of coronary angioplasty implant and graft: Secondary | ICD-10-CM | POA: Insufficient documentation

## 2022-12-09 DIAGNOSIS — F17210 Nicotine dependence, cigarettes, uncomplicated: Secondary | ICD-10-CM | POA: Diagnosis not present

## 2022-12-09 DIAGNOSIS — I2510 Atherosclerotic heart disease of native coronary artery without angina pectoris: Secondary | ICD-10-CM | POA: Insufficient documentation

## 2022-12-09 DIAGNOSIS — I447 Left bundle-branch block, unspecified: Secondary | ICD-10-CM | POA: Diagnosis present

## 2022-12-09 DIAGNOSIS — Z8673 Personal history of transient ischemic attack (TIA), and cerebral infarction without residual deficits: Secondary | ICD-10-CM | POA: Insufficient documentation

## 2022-12-09 DIAGNOSIS — N183 Chronic kidney disease, stage 3 unspecified: Secondary | ICD-10-CM | POA: Diagnosis not present

## 2022-12-09 DIAGNOSIS — I130 Hypertensive heart and chronic kidney disease with heart failure and stage 1 through stage 4 chronic kidney disease, or unspecified chronic kidney disease: Secondary | ICD-10-CM | POA: Diagnosis not present

## 2022-12-09 DIAGNOSIS — R9431 Abnormal electrocardiogram [ECG] [EKG]: Secondary | ICD-10-CM | POA: Insufficient documentation

## 2022-12-09 DIAGNOSIS — E785 Hyperlipidemia, unspecified: Secondary | ICD-10-CM | POA: Insufficient documentation

## 2022-12-09 MED ORDER — HYDRALAZINE HCL 50 MG PO TABS: 50.0000 mg | ORAL_TABLET | Freq: Three times a day (TID) | ORAL | 3 refills | Status: DC

## 2022-12-09 MED ORDER — ISOSORBIDE MONONITRATE ER 30 MG PO TB24
30.0000 mg | ORAL_TABLET | Freq: Every day | ORAL | 3 refills | Status: DC
Start: 2022-12-09 — End: 2023-04-28

## 2022-12-09 NOTE — Progress Notes (Signed)
ADVANCED HEART FAILURE CLINIC NOTE  Referring Physician: Filomena Jungling, NP  Primary Care: Filomena Jungling, NP Primary Cardiologist:  HPI: Steven Brady is a 67 y.o. male with HFrEF, CAD, cardioembolic stroke in 2023, CKD 3, left bundle branch block, CAD status post PCI in 2024 presenting today to establish care.  He was previously followed at [facility] for chronic HFrEF for at least 10 years and stroke in 2022 with EF as low as 10%.  Earlier this year he had a workup for CAD at [facility] where coronary CT showed multivessel CAD and cardiac MRI demonstrating scar in the inferior wall.  He ultimately underwent left heart cath in April 2024 and is now status post PCI to the RCA due to positive FFR.  He presented to [facility] in July 2024 for chest pain.  Echocardiogram at this time showed an EF of 25 to 30% with moderate LVH.  He was started on GDMT and discharged home.  Since that time he has also been seen by electrophysiology with plan for BiV upgrade.  Interval hx:  From a functional standpoint, he reports feeling pretty well; however, he does have 2-3 days/weekly during which he feels very fatigued. Otherwise complaint with medications. No longer smoking. Motivated to improve his overall health.    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   recently hurt left ankle - 2 weeks ago   CAD (coronary artery disease)    CHF (congestive heart failure) (HCC) 03/23/2013   Chronic HFrEF (heart failure with reduced ejection fraction) (HCC)    Chronic kidney disease, stage 3 (HCC)    Hematuria - cause not known 06/08/2011   pt has been seeing blood off and on in urine   Hyperlipidemia Dx 2015    Hypertension Dx 2015   LBBB (left bundle branch block)    Stroke (HCC)    Thrombocytopenia (HCC)    Tobacco abuse    since age of 73    Current Outpatient Medications  Medication Sig Dispense Refill   apixaban (ELIQUIS) 5 MG TABS tablet Take 5 mg by mouth 2 (two) times daily.     atorvastatin (LIPITOR) 80 MG tablet TAKE 1 TABLET BY MOUTH EVERY DAY 30 tablet 0   carvedilol (COREG) 12.5 MG tablet Take 1 tablet (12.5 mg total) by mouth 2 (two) times daily. 60 tablet 1   Cholecalciferol (VITAMIN D3) 2000 UNITS TABS Take 2,000 Units by mouth daily. 30 tablet 11   clopidogrel (PLAVIX) 75 MG tablet Take 75 mg by mouth daily.     Continuous Glucose Receiver (DEXCOM G6 RECEIVER) DEVI by Does not apply route as directed.     Continuous Glucose Sensor (DEXCOM G6 SENSOR) MISC as directed.     Continuous Glucose Transmitter (DEXCOM G6 TRANSMITTER) MISC by Does not apply route as directed.     dapagliflozin propanediol (FARXIGA) 10 MG TABS tablet Take 10 mg by mouth daily.     furosemide (LASIX) 20 MG tablet Take 20 mg by mouth as needed for fluid or edema.     hydrALAZINE (APRESOLINE) 50 MG tablet TAKE 1 TABLET IN THE MORNING; HALF TAB IN THE AFTERNOON AND 1 TAB IN THE EVENING 75 tablet 2   meclizine (ANTIVERT) 25 MG  tablet Take 1 tablet (25 mg total) by mouth 2 (two) times daily. 60 tablet 1   metFORMIN (GLUCOPHAGE) 500 MG tablet Take 500 mg by mouth 2 (two) times daily with a meal.     Multiple Vitamins-Minerals (MULTIVITAMIN WITH MINERALS) tablet Take 1 tablet by mouth daily.     nitroGLYCERIN (NITROSTAT) 0.3 MG SL tablet Place 1 tablet (0.3 mg total) under the tongue every 5 (five) minutes as needed for chest pain (Only take medication if you have chest pain. Please go to an emergency department if you have chest pain). 10 tablet 0   sacubitril-valsartan (ENTRESTO) 97-103 MG Take 1 tablet by mouth 2 (two) times daily.     spironolactone (ALDACTONE) 25 MG tablet Take 25 mg by mouth daily.     No  current facility-administered medications for this encounter.    No Known Allergies    Social History   Socioeconomic History   Marital status: Married    Spouse name: Not on file   Number of children: Not on file   Years of education: Not on file   Highest education level: Not on file  Occupational History   Not on file  Tobacco Use   Smoking status: Light Smoker    Current packs/day: 0.25    Types: Cigarettes   Smokeless tobacco: Never  Substance and Sexual Activity   Alcohol use: No    Alcohol/week: 0.0 standard drinks of alcohol   Drug use: Yes    Types: Marijuana   Sexual activity: Not on file  Other Topics Concern   Not on file  Social History Narrative   Not on file   Social Determinants of Health   Financial Resource Strain: Low Risk  (03/26/2022)   Received from [facility], [facility]   Overall Financial Resource Strain (CARDIA)    Difficulty of Paying Living Expenses: Not hard at all  Food Insecurity: No Food Insecurity (03/26/2022)   Received from [facility], [facility]   Hunger Vital Sign    Worried About Running Out of Food in the Last Year: Never true    Ran Out of Food in the Last Year: Never true  Transportation Needs: No Transportation Needs (03/26/2022)   Received from [employer], [facility]   PRAPARE - Transportation    Lack of Transportation (Medical): No    Lack of Transportation (Non-Medical): No  Physical Activity: Not on file  Stress: Not on file  Social Connections: Unknown (08/05/2021)   Received from [facility], [facility]   Social Network    Social Network: Not on file  Intimate Partner Violence: Unknown (06/27/2021)   Received from [facility], [facility]   HITS    Physically Hurt: Not on file    Insult or Talk Down To: Not on file    Threaten Physical Harm: Not on file    Scream or Curse: Not on file      Family History  Problem Relation Age of Onset   Dementia Mother    Dementia Father      PHYSICAL EXAM: Vitals:   12/09/22 1407  BP: (!) 148/88  Pulse: 69  SpO2: 97%   GENERAL: Well nourished, well developed, and in no apparent distress at rest.  HEENT: Negative for arcus senilis or xanthelasma. There is no scleral icterus.  The mucous membranes are pink and moist.   NECK: Supple, No masses. Normal carotid upstrokes without bruits. No masses or thyromegaly.    CHEST: There are no chest  wall deformities. There is no chest wall tenderness. Respirations are unlabored.  Lungs- CTA B/L CARDIAC:  JVP: 7 cm H2O         Normal S1, S2  Normal rate with regular rhythm. No murmurs, rubs or gallops.  Pulses are 2+ and symmetrical in upper and lower extremities. No edema.  ABDOMEN: Soft, non-tender, non-distended. There are no masses or hepatomegaly. There are normal bowel sounds.  EXTREMITIES: Warm and well perfused with no cyanosis, clubbing.  LYMPHATIC: No axillary or supraclavicular lymphadenopathy.  NEUROLOGIC: Patient is oriented x3 with no focal or lateralizing neurologic deficits.  PSYCH: Patients affect is appropriate, there is no evidence of anxiety or depression.  SKIN: Warm and dry; no lesions or wounds.   DATA REVIEW  ECG: 09/25/22: NSR w/ LBBB  As per my personal interpretation  ECHO: 09/26/22: LVEF 25-30% w/ prominent IVS dyssynchrony due to LBBB as per my personal interpretation  CATH: 07/13/22: Left Anterior Descending: The vessel exhibits minimal luminal irregularities.  Left Circumflex: The vessel exhibits minimal luminal irregularities. Dist Cx lesion is 70% stenosed. Third Obtuse Marginal Branch: 3rd Mrg lesion is 35% stenosed.  Right Coronary Artery: Ost RCA to Prox RCA lesion is 30% stenosed. Prox RCA lesion is 70% stenosed. Lesion length: 13 mm. TIMI flow is 3. The lesion is not complex (non high-C). Mid RCA lesion is 70% stenosed. Lesion length: 15 mm. TIMI flow is 3. The lesion is not complex (non high-C). Dist RCA lesion is 70% stenosed. Lesion length: 20  mm. TIMI flow is 3. The lesion is not complex (non high-C). Pressure wire/FFR was performed on the lesion. FFR: 0.79. Right Posterior Descending Artery: RPDA lesion is 30% stenosed.  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  Status post PCI x 3 to the RCA.  Diffuse reduction in LVEF however is consistent with a nonischemic process. NYHA class / AHA Stage:IIB Volume status & Diuretics: Euvolemic to mildly hypervolemic; continue lasix 20mg  PRN.  Vasodilators: Entresto 97/93 mg twice daily, hydralazine 50 mg 3 times daily, add imdur 30mg  daily.  Beta-Blocker: Coreg 12.5 mg twice daily MRA: Spironolactone 25 mg daily Cardiometabolic: Farxiga 10 mg daily Devices therapies & Valvulopathies: Planning to undergo BiV device by Dr. Nelly Laurence Advanced therapies: Will continue follow-up.  Currently not a candidate.  2.  Coronary artery disease -Status post PCI to the RCA x 3.  Left heart cath noted above.  3.  History of cardioembolic stroke -Currently on Eliquis and Plavix. -Will plan on discontinuing Eliquis within the next few months.  4. Hyperlipidemia - LDL remains above goal - Refer for PCSK9i   Faruq Rosenberger Advanced Heart Failure Mechanical Circulatory Support

## 2022-12-09 NOTE — H&P (View-Only) (Signed)
ADVANCED HEART FAILURE CLINIC NOTE  Referring Physician: Filomena Jungling, NP  Primary Care: Filomena Jungling, NP Primary Cardiologist:  HPI: Steven Brady is a 67 y.o. male with HFrEF, CAD, cardioembolic stroke in 2023, CKD 3, left bundle branch block, CAD status post PCI in 2024 presenting today to establish care.  He was previously followed at [facility] for chronic HFrEF for at least 10 years and stroke in 2022 with EF as low as 10%.  Earlier this year he had a workup for CAD at [facility] where coronary CT showed multivessel CAD and cardiac MRI demonstrating scar in the inferior wall.  He ultimately underwent left heart cath in April 2024 and is now status post PCI to the RCA due to positive FFR.  He presented to [facility] in July 2024 for chest pain.  Echocardiogram at this time showed an EF of 25 to 30% with moderate LVH.  He was started on GDMT and discharged home.  Since that time he has also been seen by electrophysiology with plan for BiV upgrade.  Interval hx:  From a functional standpoint, he reports feeling pretty well; however, he does have 2-3 days/weekly during which he feels very fatigued. Otherwise complaint with medications. No longer smoking. Motivated to improve his overall health.    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   recently hurt left ankle - 2 weeks ago   CAD (coronary artery disease)    CHF (congestive heart failure) (HCC) 03/23/2013   Chronic HFrEF (heart failure with reduced ejection fraction) (HCC)    Chronic kidney disease, stage 3 (HCC)    Hematuria - cause not known 06/08/2011   pt has been seeing blood off and on in urine   Hyperlipidemia Dx 2015    Hypertension Dx 2015   LBBB (left bundle branch block)    Stroke (HCC)    Thrombocytopenia (HCC)    Tobacco abuse    since age of 73    Current Outpatient Medications  Medication Sig Dispense Refill   apixaban (ELIQUIS) 5 MG TABS tablet Take 5 mg by mouth 2 (two) times daily.     atorvastatin (LIPITOR) 80 MG tablet TAKE 1 TABLET BY MOUTH EVERY DAY 30 tablet 0   carvedilol (COREG) 12.5 MG tablet Take 1 tablet (12.5 mg total) by mouth 2 (two) times daily. 60 tablet 1   Cholecalciferol (VITAMIN D3) 2000 UNITS TABS Take 2,000 Units by mouth daily. 30 tablet 11   clopidogrel (PLAVIX) 75 MG tablet Take 75 mg by mouth daily.     Continuous Glucose Receiver (DEXCOM G6 RECEIVER) DEVI by Does not apply route as directed.     Continuous Glucose Sensor (DEXCOM G6 SENSOR) MISC as directed.     Continuous Glucose Transmitter (DEXCOM G6 TRANSMITTER) MISC by Does not apply route as directed.     dapagliflozin propanediol (FARXIGA) 10 MG TABS tablet Take 10 mg by mouth daily.     furosemide (LASIX) 20 MG tablet Take 20 mg by mouth as needed for fluid or edema.     hydrALAZINE (APRESOLINE) 50 MG tablet TAKE 1 TABLET IN THE MORNING; HALF TAB IN THE AFTERNOON AND 1 TAB IN THE EVENING 75 tablet 2   meclizine (ANTIVERT) 25 MG  tablet Take 1 tablet (25 mg total) by mouth 2 (two) times daily. 60 tablet 1   metFORMIN (GLUCOPHAGE) 500 MG tablet Take 500 mg by mouth 2 (two) times daily with a meal.     Multiple Vitamins-Minerals (MULTIVITAMIN WITH MINERALS) tablet Take 1 tablet by mouth daily.     nitroGLYCERIN (NITROSTAT) 0.3 MG SL tablet Place 1 tablet (0.3 mg total) under the tongue every 5 (five) minutes as needed for chest pain (Only take medication if you have chest pain. Please go to an emergency department if you have chest pain). 10 tablet 0   sacubitril-valsartan (ENTRESTO) 97-103 MG Take 1 tablet by mouth 2 (two) times daily.     spironolactone (ALDACTONE) 25 MG tablet Take 25 mg by mouth daily.     No  current facility-administered medications for this encounter.    No Known Allergies    Social History   Socioeconomic History   Marital status: Married    Spouse name: Not on file   Number of children: Not on file   Years of education: Not on file   Highest education level: Not on file  Occupational History   Not on file  Tobacco Use   Smoking status: Light Smoker    Current packs/day: 0.25    Types: Cigarettes   Smokeless tobacco: Never  Substance and Sexual Activity   Alcohol use: No    Alcohol/week: 0.0 standard drinks of alcohol   Drug use: Yes    Types: Marijuana   Sexual activity: Not on file  Other Topics Concern   Not on file  Social History Narrative   Not on file   Social Determinants of Health   Financial Resource Strain: Low Risk  (03/26/2022)   Received from [facility], [facility]   Overall Financial Resource Strain (CARDIA)    Difficulty of Paying Living Expenses: Not hard at all  Food Insecurity: No Food Insecurity (03/26/2022)   Received from [facility], [facility]   Hunger Vital Sign    Worried About Running Out of Food in the Last Year: Never true    Ran Out of Food in the Last Year: Never true  Transportation Needs: No Transportation Needs (03/26/2022)   Received from [employer], [facility]   PRAPARE - Transportation    Lack of Transportation (Medical): No    Lack of Transportation (Non-Medical): No  Physical Activity: Not on file  Stress: Not on file  Social Connections: Unknown (08/05/2021)   Received from [facility], [facility]   Social Network    Social Network: Not on file  Intimate Partner Violence: Unknown (06/27/2021)   Received from [facility], [facility]   HITS    Physically Hurt: Not on file    Insult or Talk Down To: Not on file    Threaten Physical Harm: Not on file    Scream or Curse: Not on file      Family History  Problem Relation Age of Onset   Dementia Mother    Dementia Father      PHYSICAL EXAM: Vitals:   12/09/22 1407  BP: (!) 148/88  Pulse: 69  SpO2: 97%   GENERAL: Well nourished, well developed, and in no apparent distress at rest.  HEENT: Negative for arcus senilis or xanthelasma. There is no scleral icterus.  The mucous membranes are pink and moist.   NECK: Supple, No masses. Normal carotid upstrokes without bruits. No masses or thyromegaly.    CHEST: There are no chest  wall deformities. There is no chest wall tenderness. Respirations are unlabored.  Lungs- CTA B/L CARDIAC:  JVP: 7 cm H2O         Normal S1, S2  Normal rate with regular rhythm. No murmurs, rubs or gallops.  Pulses are 2+ and symmetrical in upper and lower extremities. No edema.  ABDOMEN: Soft, non-tender, non-distended. There are no masses or hepatomegaly. There are normal bowel sounds.  EXTREMITIES: Warm and well perfused with no cyanosis, clubbing.  LYMPHATIC: No axillary or supraclavicular lymphadenopathy.  NEUROLOGIC: Patient is oriented x3 with no focal or lateralizing neurologic deficits.  PSYCH: Patients affect is appropriate, there is no evidence of anxiety or depression.  SKIN: Warm and dry; no lesions or wounds.   DATA REVIEW  ECG: 09/25/22: NSR w/ LBBB  As per my personal interpretation  ECHO: 09/26/22: LVEF 25-30% w/ prominent IVS dyssynchrony due to LBBB as per my personal interpretation  CATH: 07/13/22: Left Anterior Descending: The vessel exhibits minimal luminal irregularities.  Left Circumflex: The vessel exhibits minimal luminal irregularities. Dist Cx lesion is 70% stenosed. Third Obtuse Marginal Branch: 3rd Mrg lesion is 35% stenosed.  Right Coronary Artery: Ost RCA to Prox RCA lesion is 30% stenosed. Prox RCA lesion is 70% stenosed. Lesion length: 13 mm. TIMI flow is 3. The lesion is not complex (non high-C). Mid RCA lesion is 70% stenosed. Lesion length: 15 mm. TIMI flow is 3. The lesion is not complex (non high-C). Dist RCA lesion is 70% stenosed. Lesion length: 20  mm. TIMI flow is 3. The lesion is not complex (non high-C). Pressure wire/FFR was performed on the lesion. FFR: 0.79. Right Posterior Descending Artery: RPDA lesion is 30% stenosed.  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  Status post PCI x 3 to the RCA.  Diffuse reduction in LVEF however is consistent with a nonischemic process. NYHA class / AHA Stage:IIB Volume status & Diuretics: Euvolemic to mildly hypervolemic; continue lasix 20mg  PRN.  Vasodilators: Entresto 97/93 mg twice daily, hydralazine 50 mg 3 times daily, add imdur 30mg  daily.  Beta-Blocker: Coreg 12.5 mg twice daily MRA: Spironolactone 25 mg daily Cardiometabolic: Farxiga 10 mg daily Devices therapies & Valvulopathies: Planning to undergo BiV device by Dr. Nelly Laurence Advanced therapies: Will continue follow-up.  Currently not a candidate.  2.  Coronary artery disease -Status post PCI to the RCA x 3.  Left heart cath noted above.  3.  History of cardioembolic stroke -Currently on Eliquis and Plavix. -Will plan on discontinuing Eliquis within the next few months.  4. Hyperlipidemia - LDL remains above goal - Refer for PCSK9i   Faruq Rosenberger Advanced Heart Failure Mechanical Circulatory Support

## 2022-12-09 NOTE — Patient Instructions (Addendum)
INCREASE Hydralazine to 50 mg Three times a day  START Imdur 30 mg daily.  Labs done today, your results will be available in MyChart, we will contact you for abnormal readings.  Your physician has requested that you have an echocardiogram. Echocardiography is a painless test that uses sound waves to create images of your heart. It provides your doctor with information about the size and shape of your heart and how well your heart's chambers and valves are working. This procedure takes approximately one hour. There are no restrictions for this procedure. Please do NOT wear cologne, perfume, aftershave, or lotions (deodorant is allowed). Please arrive 15 minutes prior to your appointment time.  Your physician recommends that you schedule a follow-up appointment in: 3 months with an echocardiogram   If you have any questions or concerns before your next appointment please send [place] a message through [place] or call our office at [phone].    TO LEAVE A MESSAGE FOR THE NURSE SELECT OPTION 2, PLEASE LEAVE A MESSAGE INCLUDING: YOUR NAME DATE OF BIRTH CALL BACK NUMBER REASON FOR CALL**this is important as we prioritize the call backs  YOU WILL RECEIVE A CALL BACK THE SAME DAY AS LONG AS YOU CALL BEFORE 4:00 PM  At the [facility], you and your health needs are our priority. As part of our continuing mission to provide you with exceptional heart care, we have created designated Provider Care Teams. These Care Teams include your primary Cardiologist (physician) and Advanced Practice Providers (APPs- Physician Assistants and Nurse Practitioners) who all work together to provide you with the care you need, when you need it.   You may see any of the following providers on your designated Care Team at your next follow up: Dr Arvilla Meres Dr Marca Ancona Dr. Marcos Eke, NP Robbie Lis, [place] [facility] [place], [place] Brynda Peon, NP Karle Plumber, PharmD   Please be sure to bring in all your medications bottles to every appointment.    Thank you for choosing [facility]

## 2022-12-17 ENCOUNTER — Other Ambulatory Visit: Payer: 59

## 2022-12-31 ENCOUNTER — Other Ambulatory Visit: Payer: Self-pay

## 2022-12-31 ENCOUNTER — Ambulatory Visit (HOSPITAL_COMMUNITY): Payer: 59

## 2022-12-31 ENCOUNTER — Ambulatory Visit (HOSPITAL_COMMUNITY)
Admission: RE | Admit: 2022-12-31 | Discharge: 2022-12-31 | Disposition: A | Discharge status: Home / Self Care | Payer: 59 | Attending: Cardiovascular Disease | Admitting: Cardiovascular Disease

## 2022-12-31 ENCOUNTER — Encounter (HOSPITAL_COMMUNITY)
Admission: RE | Disposition: A | Discharge status: Home / Self Care | Payer: Self-pay | Source: Home / Self Care | Attending: Cardiovascular Disease

## 2022-12-31 DIAGNOSIS — I2510 Atherosclerotic heart disease of native coronary artery without angina pectoris: Secondary | ICD-10-CM | POA: Diagnosis not present

## 2022-12-31 DIAGNOSIS — Z79899 Other long term (current) drug therapy: Secondary | ICD-10-CM | POA: Diagnosis not present

## 2022-12-31 DIAGNOSIS — Z955 Presence of coronary angioplasty implant and graft: Secondary | ICD-10-CM | POA: Insufficient documentation

## 2022-12-31 DIAGNOSIS — N183 Chronic kidney disease, stage 3 unspecified: Secondary | ICD-10-CM | POA: Insufficient documentation

## 2022-12-31 DIAGNOSIS — I5020 Unspecified systolic (congestive) heart failure: Secondary | ICD-10-CM | POA: Diagnosis not present

## 2022-12-31 DIAGNOSIS — Z7902 Long term (current) use of antithrombotics/antiplatelets: Secondary | ICD-10-CM | POA: Insufficient documentation

## 2022-12-31 DIAGNOSIS — I130 Hypertensive heart and chronic kidney disease with heart failure and stage 1 through stage 4 chronic kidney disease, or unspecified chronic kidney disease: Secondary | ICD-10-CM | POA: Diagnosis present

## 2022-12-31 DIAGNOSIS — I447 Left bundle-branch block, unspecified: Secondary | ICD-10-CM | POA: Diagnosis not present

## 2022-12-31 DIAGNOSIS — Z7901 Long term (current) use of anticoagulants: Secondary | ICD-10-CM | POA: Insufficient documentation

## 2022-12-31 DIAGNOSIS — I5022 Chronic systolic (congestive) heart failure: Secondary | ICD-10-CM | POA: Diagnosis not present

## 2022-12-31 DIAGNOSIS — Z87891 Personal history of nicotine dependence: Secondary | ICD-10-CM | POA: Insufficient documentation

## 2022-12-31 DIAGNOSIS — I255 Ischemic cardiomyopathy: Secondary | ICD-10-CM | POA: Diagnosis not present

## 2022-12-31 DIAGNOSIS — Z8673 Personal history of transient ischemic attack (TIA), and cerebral infarction without residual deficits: Secondary | ICD-10-CM | POA: Diagnosis not present

## 2022-12-31 DIAGNOSIS — E785 Hyperlipidemia, unspecified: Secondary | ICD-10-CM | POA: Diagnosis not present

## 2022-12-31 HISTORY — PX: BIV ICD INSERTION CRT-D: EP1195

## 2022-12-31 LAB — GLUCOSE, CAPILLARY
Glucose-Capillary: 126 mg/dL — ABNORMAL HIGH (ref 70–99)
Glucose-Capillary: 111 mg/dL — ABNORMAL HIGH (ref 70–99)

## 2022-12-31 SURGERY — BIV ICD INSERTION CRT-D

## 2022-12-31 MED ORDER — ACETAMINOPHEN 325 MG PO TABS: 325.0000 mg | ORAL_TABLET | ORAL | Status: DC | PRN

## 2022-12-31 MED ORDER — FENTANYL CITRATE (PF) 100 MCG/2ML IJ SOLN
INTRAMUSCULAR | Status: DC | PRN
  Administered 2022-12-31: 50 ug via INTRAVENOUS
  Administered 2022-12-31: 25 ug via INTRAVENOUS

## 2022-12-31 MED ORDER — LIDOCAINE HCL 1 % IJ SOLN
INTRAMUSCULAR | Status: AC
  Filled 2022-12-31: qty 60

## 2022-12-31 MED ORDER — SODIUM CHLORIDE 0.9 % IV SOLN: INTRAVENOUS | Status: DC

## 2022-12-31 MED ORDER — FENTANYL CITRATE (PF) 100 MCG/2ML IJ SOLN
INTRAMUSCULAR | Status: AC
  Filled 2022-12-31: qty 2

## 2022-12-31 MED ORDER — LIDOCAINE HCL (PF) 1 % IJ SOLN
INTRAMUSCULAR | Status: DC | PRN
  Administered 2022-12-31: 60 mL

## 2022-12-31 MED ORDER — HEPARIN (PORCINE) IN NACL 1000-0.9 UT/500ML-% IV SOLN
INTRAVENOUS | Status: DC | PRN
  Administered 2022-12-31: 500 mL

## 2022-12-31 MED ORDER — SODIUM CHLORIDE 0.9 % IV SOLN: 80.0000 mg | INTRAVENOUS | Status: AC

## 2022-12-31 MED ORDER — SODIUM CHLORIDE 0.9 % IV SOLN
INTRAVENOUS | Status: AC
  Filled 2022-12-31: qty 2

## 2022-12-31 MED ORDER — CEFAZOLIN SODIUM-DEXTROSE 2-4 GM/100ML-% IV SOLN
INTRAVENOUS | Status: AC
  Administered 2022-12-31: 2 g via INTRAVENOUS
  Filled 2022-12-31: qty 100

## 2022-12-31 MED ORDER — ONDANSETRON HCL 4 MG/2ML IJ SOLN: 4.0000 mg | Freq: Four times a day (QID) | INTRAMUSCULAR | Status: DC | PRN

## 2022-12-31 MED ORDER — SODIUM CHLORIDE 0.9 % IV SOLN
INTRAVENOUS | Status: AC
  Administered 2022-12-31: 80 mg
  Filled 2022-12-31: qty 2

## 2022-12-31 MED ORDER — POVIDONE-IODINE 10 % EX SWAB: 2.0000 | Freq: Once | CUTANEOUS | Status: DC

## 2022-12-31 MED ORDER — MIDAZOLAM HCL 5 MG/5ML IJ SOLN
INTRAMUSCULAR | Status: AC
  Filled 2022-12-31: qty 5

## 2022-12-31 MED ORDER — IOHEXOL 350 MG/ML SOLN
INTRAVENOUS | Status: DC | PRN
  Administered 2022-12-31: 7 mL
  Administered 2022-12-31: 4 mL

## 2022-12-31 MED ORDER — CEFAZOLIN SODIUM-DEXTROSE 2-4 GM/100ML-% IV SOLN: 2.0000 g | INTRAVENOUS | Status: AC

## 2022-12-31 MED ORDER — MIDAZOLAM HCL 5 MG/5ML IJ SOLN
INTRAMUSCULAR | Status: DC | PRN
  Administered 2022-12-31: 2 mg via INTRAVENOUS
  Administered 2022-12-31: 1 mg via INTRAVENOUS

## 2022-12-31 SURGICAL SUPPLY — 22 items
BALLN COR SINUS VENO 6FR 80 (BALLOONS) ×1
BALLOON COR SINUS VENO 6FR 80 (BALLOONS) IMPLANT
CABLE SURGICAL S-101-97-12 (CABLE) ×1 IMPLANT
CATH CPS DIRECT 115 DS2C019 (CATHETERS) IMPLANT
CATH CPS DIRECT 135 DS2C020 (CATHETERS) IMPLANT
CATH CPS DIRECT WD DS2C021 (CATHETERS) IMPLANT
GUIDEWIRE ANGLED .035X150CM (WIRE) IMPLANT
ICD GALLANT HFCRTD CDHFA500Q (ICD Generator) IMPLANT
KIT ESSENTIALS PG (KITS) IMPLANT
KIT MICROPUNCTURE NIT STIFF (SHEATH) IMPLANT
LEAD DURATA 7122Q-58CM (Lead) IMPLANT
LEAD QUARTET 1456Q-86 (Lead) IMPLANT
LEAD ULTIPACE 52 LPA1231/52 (Lead) IMPLANT
PAD DEFIB RADIO PHYSIO CONN (PAD) ×1 IMPLANT
QUARTET 1456Q-86 (Lead) ×1 IMPLANT
SHEATH 7FR PRELUDE SNAP 13 (SHEATH) IMPLANT
SHEATH 9.5FR PRELUDE SNAP 13 (SHEATH) IMPLANT
SHEATH PROBE COVER 6X72 (BAG) IMPLANT
SLITTER AGILIS HISPRO (INSTRUMENTS) IMPLANT
TRAY PACEMAKER INSERTION (PACKS) ×1 IMPLANT
WIRE ACUITY WHISPER EDS 4648 (WIRE) IMPLANT
WIRE HI TORQ VERSACORE-J 145CM (WIRE) IMPLANT

## 2022-12-31 NOTE — Interval H&P Note (Signed)
History and Physical Interval Note:  12/31/2022 7:26 AM  Steven Brady  has presented today for surgery, with the diagnosis of heart failure.  The various methods of treatment have been discussed with the patient and family. After consideration of risks, benefits and other options for treatment, the patient has consented to  Procedure(s): BIV ICD INSERTION CRT-D (N/A) as a surgical intervention.  The patient's history has been reviewed, patient examined, no change in status, stable for surgery.  I have reviewed the patient's chart and labs.  Questions were answered to the patient's satisfaction.     Roberts Gaudy Heike Pounds

## 2022-12-31 NOTE — Discharge Instructions (Signed)
After Your ICD (Implantable Cardiac Defibrillator)   You have a [employer] ICD  ACTIVITY Do not lift your arm above shoulder height for 1 week after your procedure. After 7 days, you may progress as below.  You should remove your sling 24 hours after your procedure, unless otherwise instructed by your provider.     Thursday January 07, 2023  Friday January 08, 2023 Saturday January 09, 2023 Sunday January 10, 2023   Do not lift, push, pull, or carry anything over 10 pounds with the affected arm until 6 weeks (Thursday February 11, 2023 ) after your procedure.   You may drive AFTER your wound check, unless you have been told otherwise by your provider.   Ask your healthcare provider when you can go back to work   INCISION/Dressing If you are on a blood thinner such as Coumadin, Xarelto, Eliquis, Plavix, or Pradaxa please confirm with your provider when this should be resumed.   If large square, outer bandage is left in place, this can be removed after 24 hours from your procedure. Do not remove steri-strips or glue as below.   Monitor your defibrillator site for redness, swelling, and drainage. Call the device clinic at [phone] if you experience these symptoms or fever/chills.  If your incision is sealed with Steri-strips or staples, you may shower 7 days after your procedure or when told by your provider. Do not remove the steri-strips or let the shower hit directly on your site. You may wash around your site with soap and water.    If you were discharged in a sling, please do not wear this during the day more than 48 hours after your surgery unless otherwise instructed. This may increase the risk of stiffness and soreness in your shoulder.   Avoid lotions, ointments, or perfumes over your incision until it is well-healed.  You may use a hot tub or a pool AFTER your wound check appointment if the incision is completely closed.  Your ICD is designed to protect you from life  threatening heart rhythms. Because of this, you may receive a shock.   1 shock with no symptoms:  Call the office during business hours. 1 shock with symptoms (chest pain, chest pressure, dizziness, lightheadedness, shortness of breath, overall feeling unwell):  Call 911. If you experience 2 or more shocks in 24 hours:  Call 911. If you receive a shock, you should not drive for 6 months per the [place] DMV IF you receive appropriate therapy from your ICD.   ICD Alerts:  Some alerts are vibratory and others beep. These are NOT emergencies. Please call our office to let us know. If this occurs at night or on weekends, it can wait until the next business day. Send a remote transmission.  If your device is capable of reading fluid status (for heart failure), you will be offered monthly monitoring to review this with you.   DEVICE MANAGEMENT Remote monitoring is used to monitor your ICD from home. This monitoring is scheduled every 91 days by our office. It allows [place] to keep an eye on the functioning of your device to ensure it is working properly. You will routinely see your Electrophysiologist annually (more often if necessary).   You should receive your ID card for your new device in 4-8 weeks. Keep this card with you at all times once received. Consider wearing a medical alert bracelet or necklace.  Your ICD  may be MRI compatible. This will be discussed at your next  office visit/wound check.  You should avoid contact with strong electric or magnetic fields.   Do not use amateur (ham) radio equipment or electric (arc) welding torches. MP3 player headphones with magnets should not be used. Some devices are safe to use if held at least 12 inches (30 cm) from your defibrillator. These include power tools, lawn mowers, and speakers. If you are unsure if something is safe to use, ask your health care provider.  When using your cell phone, hold it to the ear that is on the opposite side from the  defibrillator. Do not leave your cell phone in a pocket over the defibrillator.  You may safely use electric blankets, heating pads, computers, and microwave ovens.  Call the office right away if: You have chest pain. You feel more than one shock. You feel more short of breath than you have felt before. You feel more light-headed than you have felt before. Your incision starts to open up.  This information is not intended to replace advice given to you by your health care provider. Make sure you discuss any questions you have with your health care provider.

## 2023-01-01 ENCOUNTER — Encounter (HOSPITAL_COMMUNITY): Payer: Self-pay | Admitting: Cardiovascular Disease

## 2023-01-05 NOTE — Progress Notes (Unsigned)
Cardiology Office Note    Date:  01/07/2023  ID:  Steven Brady, DOB January 29, 1956, MRN [id number] PCP:  Filomena Jungling, NP  Cardiologist:  Verne Carrow, MD  Electrophysiologist:  Maurice Small, MD   Chief Complaint: f/u CAD, chronic HFrEF   History of Present Illness: .    Per Steven Brady is a 67 y.o. 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   Patient was previously followed by [facility] with chronic HFrEF of at least 10+ years of unknown etiology as well as stroke in 2022 in setting of EF reported 11%. He apparently had previously been followed in [place] before he saw them. He was started on Eliquis in 2022 after stroke in the setting of EF 11% recommended by neurology and cardiology as stroke was felt most likely cardioembolic. Per notes, 30-day cardiac monitor completed on 03/2021 showed normal sinus rhythm, periodic intraventricular conduction delay, and episodes of nonsustained ventricular tachycardia. The patient had reported a remote cath with unclear details. [facility] initiated a workup for coronary disease with abnormal cor CT showing multivessel CAD as well as cMRI which reportedly suggested prior infarct involving the inferior wall. He ultimately underwent cath 06/2022 showing "minimal LAD disease. Marginal 35%. PDA 30%. Distal circumflex artery 70% stenosis. The proximal mid and RCA had a 70% stenosis with positive FFR. Stents were placed to proximal RCA, mid RCA, distal RCA." He was placed on triple therapy with Eliquis, Plavix + aspirin with aspirin discontinued after 2 weeks. Our team saw him early July 2024 for chest pain and minimal troponin elevation. 2D echo showed EF 25-30%, G1DD, global HK, moderate LVH. Continued medical therapy was recommended. Atorvastatin was  increased. He requested to f/u with [facility] going forward. I referred him to advanced HF clinic (saw Dr. Gasper Lloyd with titration of hydralazine/Imdur) and EP (saw Dr. Nelly Laurence). He had a BiV-ICD implanted 12/31/22. He is pending pharmD visit to discuss lipid management.  He is seen for follow-up today doing great from a cardiac standpoint. He notes that since he had his ICD placed, his previous chronic dizziness has resolved. He also notes that his SOB has improved as well and his breathing now feels "normal." No angina. He was granted instructions to remove the white square pad over his defibrillator site after 24 hours but was nervous to do so on his own so we removed today. Site looks good without complication.  Labwork independently reviewed: 12/23/22 [facility] A1C 6.2, LDL 91, trig 192, Cr 1.22, K 5.3, LFTs ok, Hgb 13.8, plt 126 11/2022 Hgb 13.6, plt 115, LDL 93, trig 74, K 4.2, CR 1.11, LFTs ok  ROS: .    Please see the history of present illness.  All other systems are reviewed and otherwise negative.  Studies Reviewed: Marland Kitchen    EKG:  EKG is not ordered today but reviewed recent post-device EKG 12/31/22 with atrial sensed V paced rhythm with diffuse TWI  CV Studies: Cardiac studies reviewed are outlined and summarized above. Otherwise please see EMR for full report.   Current Reported Medications:.    Current Meds  Medication Sig   apixaban (ELIQUIS) 5 MG TABS tablet Take 5 mg by mouth 2 (two) times daily.   atorvastatin (LIPITOR) 80 MG tablet TAKE 1 TABLET BY MOUTH EVERY DAY   Brinzolamide-Brimonidine 1-0.2 % SUSP Apply to eye.   carvedilol (  COREG) 12.5 MG tablet Take 1 tablet (12.5 mg total) by mouth 2 (two) times daily.   Cholecalciferol (VITAMIN D3) 2000 UNITS TABS Take 2,000 Units by mouth daily.   clopidogrel (PLAVIX) 75 MG tablet Take 75 mg by mouth in the morning.   Continuous Glucose Receiver (DEXCOM G6 RECEIVER) DEVI by Does not apply route as directed.    Continuous Glucose Sensor (DEXCOM G6 SENSOR) MISC as directed.   Continuous Glucose Transmitter (DEXCOM G6 TRANSMITTER) MISC by Does not apply route as directed.   dapagliflozin propanediol (FARXIGA) 10 MG TABS tablet Take 10 mg by mouth in the morning.   diclofenac Sodium (VOLTAREN) 1 % GEL Apply topically.   hydrALAZINE (APRESOLINE) 50 MG tablet Take 1 tablet (50 mg total) by mouth 3 (three) times daily.   isosorbide mononitrate (IMDUR) 30 MG 24 hr tablet Take 1 tablet (30 mg total) by mouth daily.   ketorolac (ACULAR) 0.5 % ophthalmic solution Place one drop into both eyes 3 (three) times a day.   metFORMIN (GLUCOPHAGE) 500 MG tablet Take 500 mg by mouth 2 (two) times daily with a meal.   Multiple Vitamin (MULTIVITAMIN WITH MINERALS) TABS tablet Take 1 tablet by mouth in the morning. Centrum   nitroGLYCERIN (NITROSTAT) 0.3 MG SL tablet Place 1 tablet (0.3 mg total) under the tongue every 5 (five) minutes as needed for chest pain (Only take medication if you have chest pain. Please go to an emergency department if you have chest pain).   ofloxacin (OCUFLOX) 0.3 % ophthalmic solution Place one drop into both eyes 2 (two) times daily.   sacubitril-valsartan (ENTRESTO) 97-103 MG Take 1 tablet by mouth 2 (two) times daily.   spironolactone (ALDACTONE) 25 MG tablet Take 25 mg by mouth in the morning.   timolol (TIMOPTIC-XR) 0.5 % ophthalmic gel-forming Apply to eye.   furosemide (LASIX) 20 MG tablet Take 20 mg by mouth as needed for fluid or edema.    Physical Exam:    VS:  BP 114/76   Pulse 92   Ht 5\' 7"  (1.702 m)   Wt 199 lb 6.4 oz (90.4 kg)   SpO2 98%   BMI 31.23 kg/m    Wt Readings from Last 3 Encounters:  01/07/23 199 lb 6.4 oz (90.4 kg)  12/31/22 196 lb (88.9 kg)  12/09/22 200 lb 6.4 oz (90.9 kg)    GEN: Well nourished, well developed in no acute distress NECK: No JVD; No carotid bruits CARDIAC: RRR, no murmurs, rubs, gallops. ICD site left upper chest without redness/suppration  or acute abnormality RESPIRATORY:  Clear to auscultation without rales, wheezing or rhonchi  ABDOMEN: Soft, non-tender, non-distended EXTREMITIES:  No edema; No acute deformity   Asessement and Plan:.    1. Chronic HFrEF - appears euvolemic on exam. He is on excellent medical therapy including carvedilol, Farxiga, hydralazine, Imdur, Entresto and spironolactone. Clarified with patient he is taking the hydralazine 50mg  TID as prescribed (the old 25mg  was populating on list). He reports he is actually already feeling better since his BiV-ICD implantation. He has a repeat echo scheduled December 2024 with follow-up with Dr. Gasper Lloyd. The patient has Lasix PRN on his medicine list but reports he hasn't had to use this recently so doesn't have rx at home - will send in rx to have on hand to [facility]. He will notify our office if he is finding he has to use this more than once a week or develops any signs of fluid retention.   Addendum:  I was able to obtain a copy of the patient's recent labs obtained by PCP at outside office in [place]. It does appear that the patient had mild hyperkalemia by those labs at 5.3 which is new. I am not able to see result notes since it's in a different system; unclear if further action taken for this or planned. I will place a phone note to request he return at his convenience for repeat BMET to ensure stable.  2. CAD - no recurrent chest pain. Maintained on Plavix without ASA given Eliquis use. Continue atorvastatin and plan for pharmD visit next week to discuss lipid management. Follow-up gen cards 06/2023 to revisit plan for Plavix rx.  3. LBBB, NSVT - s/p BiV ICD by EP, continue follow-up wound check next week as planned and formal EP visit scheduled back with EP APP in January 2025. Continue BB.  4. History of stroke - maintained on Eliquis due to concern for cardioembolic source given LVEF was as low as 11% at time of stroke. This has been felt reasonable to  continue. Dose remains appropriate. No bleeding reported. Anticipate periodic monitoring of CBC at follow-up given history mildly decreased platelet count, last assessed 11/2022. Recommend AHF team re-evaluate necessity for Eliquis rx if there is improvement in LV function on his December echocardiogram.     Disposition: F/u with Dr. Clifton James in April 2024  Signed, Laurann Montana, PA-C

## 2023-01-07 ENCOUNTER — Encounter: Payer: Self-pay | Admitting: Physician Assistant

## 2023-01-07 ENCOUNTER — Ambulatory Visit: Payer: 59 | Attending: Physician Assistant | Admitting: Physician Assistant

## 2023-01-07 ENCOUNTER — Telehealth: Payer: Self-pay | Admitting: Physician Assistant

## 2023-01-07 VITALS — BP 114/76 | HR 92 | Ht 67.0 in | Wt 199.4 lb

## 2023-01-07 DIAGNOSIS — I2510 Atherosclerotic heart disease of native coronary artery without angina pectoris: Secondary | ICD-10-CM

## 2023-01-07 DIAGNOSIS — I447 Left bundle-branch block, unspecified: Secondary | ICD-10-CM

## 2023-01-07 DIAGNOSIS — I5022 Chronic systolic (congestive) heart failure: Secondary | ICD-10-CM

## 2023-01-07 DIAGNOSIS — I4729 Other ventricular tachycardia: Secondary | ICD-10-CM | POA: Diagnosis not present

## 2023-01-07 DIAGNOSIS — Z8673 Personal history of transient ischemic attack (TIA), and cerebral infarction without residual deficits: Secondary | ICD-10-CM

## 2023-01-07 MED ORDER — FUROSEMIDE 20 MG PO TABS: 20.0000 mg | ORAL_TABLET | ORAL | 2 refills | Status: DC | PRN

## 2023-01-07 NOTE — Telephone Encounter (Signed)
Spoke with pt's fiance', DPR on file.  She will bring pt back today for a BMET to repeat K+ level.  She states pt has not had any further bleeding in reference to a UTI.

## 2023-01-07 NOTE — Patient Instructions (Signed)
Medication Instructions:  Your physician recommends that you continue on your current medications as directed. Please refer to the Current Medication list given to you today.  *If you need a refill on your cardiac medications before your next appointment, please call your pharmacy*   Lab Work: None ordered  If you have labs (blood work) drawn today and your tests are completely normal, you will receive your results only by: MyChart Message (if you have MyChart) OR A paper copy in the mail If you have any lab test that is abnormal or we need to change your treatment, we will call you to review the results.   Testing/Procedures: None ordered   Follow-Up: At [facility], you and your health needs are our priority.  As part of our continuing mission to provide you with exceptional heart care, we have created designated Provider Care Teams.  These Care Teams include your primary Cardiologist (physician) and Advanced Practice Providers (APPs -  Physician Assistants and Nurse Practitioners) who all work together to provide you with the care you need, when you need it.  We recommend signing up for the patient portal called "MyChart".  Sign up information is provided on this After Visit Summary.  MyChart is used to connect with patients for Virtual Visits (Telemedicine).  Patients are able to view lab/test results, encounter notes, upcoming appointments, etc.  Non-urgent messages can be sent to your provider as well.   To learn more about what you can do with MyChart, go to [email or web address].    Your next appointment:   6 month(s)  Provider:   Verne Carrow, MD     Other Instructions

## 2023-01-07 NOTE — Telephone Encounter (Signed)
Patient was seen in office earlier this afternoon. Please let pt know that I placed an inquiry in his chart to help obtain copy of [facility] labs from his PCP from the visit he had with them on 10/2 and have received the results. His potassium was mildly elevated at that time at 5.3 - can you ask him if they made any adjustments or plans to recheck at that time? If not, I would advise he return for a BMET. So sorry for the inconvenience, just challenging when the medical record systems are in different systems. It also brought in notes from a visit from September where he had a UTI so would also make sure that he has not seen any further blood in his urine after this was treated. Thank you!

## 2023-01-07 NOTE — Addendum Note (Signed)
Addended by: Burnetta Sabin on: 01/07/2023 02:51 PM   Modules accepted: Orders

## 2023-01-08 LAB — BASIC METABOLIC PANEL
BUN/Creatinine Ratio: 19 (ref 10–24)
BUN: 28 mg/dL — ABNORMAL HIGH (ref 8–27)
CO2: 23 mmol/L (ref 20–29)
Calcium: 9.9 mg/dL (ref 8.6–10.2)
Chloride: 105 mmol/L (ref 96–106)
Creatinine, Ser: 1.47 mg/dL — ABNORMAL HIGH (ref 0.76–1.27)
Glucose: 101 mg/dL — ABNORMAL HIGH (ref 70–99)
Potassium: 4.2 mmol/L (ref 3.5–5.2)
Sodium: 145 mmol/L — ABNORMAL HIGH (ref 134–144)
eGFR: 53 mL/min/{1.73_m2} — ABNORMAL LOW (ref >59)

## 2023-01-12 ENCOUNTER — Telehealth: Payer: Self-pay | Admitting: Pharmacy Technician

## 2023-01-12 ENCOUNTER — Other Ambulatory Visit (HOSPITAL_COMMUNITY): Payer: Self-pay

## 2023-01-12 ENCOUNTER — Ambulatory Visit: Payer: 59 | Attending: Internal Medicine | Admitting: Student

## 2023-01-12 ENCOUNTER — Telehealth: Payer: Self-pay | Admitting: Pharmacist

## 2023-01-12 DIAGNOSIS — E118 Type 2 diabetes mellitus with unspecified complications: Secondary | ICD-10-CM | POA: Diagnosis not present

## 2023-01-12 DIAGNOSIS — E785 Hyperlipidemia, unspecified: Secondary | ICD-10-CM

## 2023-01-12 MED ORDER — REPATHA SURECLICK 140 MG/ML ~~LOC~~ SOAJ: 140.0000 mg | SUBCUTANEOUS | 3 refills | Status: DC

## 2023-01-12 NOTE — Patient Instructions (Addendum)
Your Results:             Your most recent labs Goal  Total Cholesterol 164 < 200  Triglycerides 74 < 150  HDL (happy/good cholesterol) 57 > 40  LDL (lousy/bad cholesterol 93 < 55   Medication changes:  Start taking Repatha 140 mg every 14 days and continue taking atorvastatin 80 mg daily      Repatha is a cholesterol medication that improved your body's ability to get rid of "bad cholesterol" known as LDL. It can lower your LDL up to 60%! It is an injection that is given under the skin every 2 weeks. The medication often requires a prior authorization from your insurance company. We will take care of submitting all the necessary information to your insurance company to get it approved. The most common side effects of Repatha include runny nose, symptoms of the common cold, rarely flu or flu-like symptoms, back/muscle pain in about 3-4% of the patients, and redness, pain, or bruising at the injection site.   Lab orders: We want to repeat labs on Jan 30,2025.

## 2023-01-12 NOTE — Telephone Encounter (Signed)
Pharmacy Patient Advocate Encounter   Received notification from Pt Calls Messages that prior authorization for repatha is required/requested.   Insurance verification completed.   The patient is insured through [facility] .   Per test claim: PA required; PA submitted to [facility] via CoverMyMeds Key/confirmation #/EOC [id number] Status is pending

## 2023-01-12 NOTE — Telephone Encounter (Signed)
Pharmacy Patient Advocate Encounter  Received notification from [facility] that Prior Authorization for repatha has been APPROVED from 01/12/23 to 07/13/23. Ran test claim, Copay is $0.00- no copay- one month. This test claim was processed through [facility]- copay amounts may vary at other pharmacies due to pharmacy/plan contracts, or as the patient moves through the different stages of their insurance plan.   PA #/Case ID/Reference #: [id number]

## 2023-01-12 NOTE — Progress Notes (Signed)
Patient ID: Steven Brady                 DOB: June 30, 1955                    MRN: [id number]      HPI: Steven Brady is a 67 y.o. male patient referred to lipid clinic by Dr.Sabharwal. PMH is significant for HFrEF, CAD, cardioembolic stroke in 2023, CKD 3, left bundle branch block, CAD status post PCI in 2024   Patient presented today for lipid clinic. Reports able to tolerate Lipitor well. He has been eating heart healthy diet and takes his dog for walk every day 1 mile- 30 min, was doing starch bands but after stroke he has stopped, still gets dizzy sometime.  Reviewed options for lowering LDL cholesterol, including ezetimibe, PCSK-9 inhibitors, bempedoic acid and inclisiran.  Discussed mechanisms of action, dosing, side effects and potential decreases in LDL cholesterol.  Also reviewed cost information and potential options for patient assistance.  Current Medications: Lipitor 80 mg daily  Intolerances: none  Risk Factors: T2DM, CAD, cardioembolic stroke in 2023, CKD 3, left bundle branch block, CAD status post PCI in 2024  LDL goal: <55 mg/dl   Last Lab: LDLc 93, TC 164, TG 74, HDL 57   Diet: has improved, eats lot of vegetables, low sodium and low fat ,Eats out once a week.  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   recently hurt left ankle - 2 weeks ago   CAD (coronary artery disease)    CHF (congestive heart failure) (HCC) 03/23/2013    Chronic HFrEF (heart failure with reduced ejection fraction) (HCC)    Chronic kidney disease, stage 3 (HCC)    Hematuria - cause not known 06/08/2011   pt has been seeing blood off and on in urine   Hyperlipidemia Dx 2015   Hypertension Dx 2015   LBBB (left bundle branch block)    Stroke (HCC)    Thrombocytopenia (HCC)    Tobacco abuse    since age of 63    Current Outpatient Medications on File Prior to Visit  Medication Sig Dispense Refill   apixaban (ELIQUIS) 5 MG TABS tablet Take 5 mg by mouth 2 (two) times daily.     atorvastatin (LIPITOR) 80 MG tablet TAKE 1 TABLET BY MOUTH EVERY DAY 30 tablet 0   Brinzolamide-Brimonidine 1-0.2 % SUSP Apply to eye.     carvedilol (COREG) 12.5 MG tablet Take 1 tablet (12.5 mg total) by mouth 2 (two) times daily. 60 tablet 1   Cholecalciferol (VITAMIN D3) 2000 UNITS TABS Take 2,000 Units by mouth daily. 30 tablet 11   clopidogrel (PLAVIX) 75 MG tablet Take 75 mg by mouth in the morning.     Continuous Glucose Receiver (DEXCOM G6 RECEIVER) DEVI by Does not apply route as directed.  Continuous Glucose Sensor (DEXCOM G6 SENSOR) MISC as directed.     Continuous Glucose Transmitter (DEXCOM G6 TRANSMITTER) MISC by Does not apply route as directed.     dapagliflozin propanediol (FARXIGA) 10 MG TABS tablet Take 10 mg by mouth in the morning.     furosemide (LASIX) 20 MG tablet Take 1 tablet (20 mg total) by mouth as needed for fluid or edema. 30 tablet 2   hydrALAZINE (APRESOLINE) 50 MG tablet Take 1 tablet (50 mg total) by mouth 3 (three) times daily. 200 tablet 3   metFORMIN (GLUCOPHAGE) 500 MG tablet Take 500 mg by mouth 2 (two) times daily with a meal.     Multiple Vitamin (MULTIVITAMIN WITH MINERALS) TABS tablet Take 1 tablet by mouth in the morning. Centrum     nitroGLYCERIN (NITROSTAT) 0.3 MG SL tablet Place 1 tablet (0.3 mg total) under the tongue every 5 (five) minutes as needed for chest pain (Only take medication if you have chest pain.  Please go to an emergency department if you have chest pain). 10 tablet 0   ofloxacin (OCUFLOX) 0.3 % ophthalmic solution Place one drop into both eyes 2 (two) times daily.     sacubitril-valsartan (ENTRESTO) 97-103 MG Take 1 tablet by mouth 2 (two) times daily.     spironolactone (ALDACTONE) 25 MG tablet Take 25 mg by mouth in the morning.     timolol (TIMOPTIC-XR) 0.5 % ophthalmic gel-forming Apply to eye.     diclofenac Sodium (VOLTAREN) 1 % GEL Apply topically.     isosorbide mononitrate (IMDUR) 30 MG 24 hr tablet Take 1 tablet (30 mg total) by mouth daily. 90 tablet 3   ketorolac (ACULAR) 0.5 % ophthalmic solution Place one drop into both eyes 3 (three) times a day.     No current facility-administered medications on file prior to visit.    No Known Allergies  Assessment/Plan:  1. Hyperlipidemia -  Problem  Hyperlipidemia   Current Medications: Lipitor 80 mg daily  Intolerances: none  Risk Factors: T2DM, CAD, cardioembolic stroke in 2023, CKD 3, left bundle branch block, CAD status post PCI in 2024  LDL goal: <55 mg/dl   Last Lab: LDLc 93, TC 164, TG 74, HDL 57     Hyperlipidemia Assessment:  LDL goal: <55 mg/dl last LDLc 93 mg/dl  Tolerates high intensity statins well without any side effects  Risk factors - T2DM, CAD, cardioembolic stroke in 2023, CKD 3, left bundle branch block, CAD status post PCI in 2024  Discussed next potential options (Zetia,PCSK-9 inhibitors, bempedoic acid and inclisiran); cost, dosing efficacy, side effects  Reiterated importance of healthy diet and regular exercise  Plan: Continue taking current medications (Lipitor 80 mg daily) Given LDLc level and risk factor reasonable to consider PCSK9i as a next option  Received  PA approval  for PCSK9i; start taking Repatha 140 mg Q14D  Lipid lab due in 2-3 months after starting PCSK9i    Thank you,  Carmela Hurt, Pharm.D [facility] [address], [place], [place] [address]  Phone: [phone]; Fax: [phone]

## 2023-01-13 ENCOUNTER — Encounter: Payer: Self-pay | Admitting: Student

## 2023-01-13 NOTE — Assessment & Plan Note (Signed)
Assessment:  LDL goal: <55 mg/dl last LDLc 93 mg/dl  Tolerates high intensity statins well without any side effects  Risk factors - T2DM, CAD, cardioembolic stroke in 2023, CKD 3, left bundle branch block, CAD status post PCI in 2024  Discussed next potential options (Zetia,PCSK-9 inhibitors, bempedoic acid and inclisiran); cost, dosing efficacy, side effects  Reiterated importance of healthy diet and regular exercise  Plan: Continue taking current medications (Lipitor 80 mg daily) Given LDLc level and risk factor reasonable to consider PCSK9i as a next option  Received  PA approval  for PCSK9i; start taking Repatha 140 mg Q14D  Lipid lab due in 2-3 months after starting [facility]

## 2023-01-14 ENCOUNTER — Telehealth: Payer: Self-pay

## 2023-01-14 ENCOUNTER — Ambulatory Visit: Payer: 59 | Attending: Cardiology

## 2023-01-14 DIAGNOSIS — I255 Ischemic cardiomyopathy: Secondary | ICD-10-CM

## 2023-01-14 DIAGNOSIS — I5022 Chronic systolic (congestive) heart failure: Secondary | ICD-10-CM | POA: Diagnosis not present

## 2023-01-14 DIAGNOSIS — N1831 Chronic kidney disease, stage 3a: Secondary | ICD-10-CM

## 2023-01-14 DIAGNOSIS — I447 Left bundle-branch block, unspecified: Secondary | ICD-10-CM

## 2023-01-14 LAB — CUP PACEART INCLINIC DEVICE CHECK
Battery Remaining Longevity: 88 mo
Brady Statistic RA Percent Paced: 0.99 %
Brady Statistic RV Percent Paced: 99.53 %
Date Time Interrogation Session: 20241024170924
HighPow Impedance: 65.25 Ohm
Implantable Lead Connection Status: 753985
Implantable Lead Connection Status: 753985
Implantable Lead Connection Status: 753985
Implantable Lead Implant Date: 20241010
Implantable Lead Implant Date: 20241010
Implantable Lead Implant Date: 20241010
Implantable Lead Location: 753858
Implantable Lead Location: 753859
Implantable Lead Location: 753860
Implantable Pulse Generator Implant Date: 20241010
Lead Channel Impedance Value: 350 Ohm
Lead Channel Impedance Value: 425 Ohm
Lead Channel Impedance Value: 600 Ohm
Lead Channel Pacing Threshold Amplitude: 0.25 V
Lead Channel Pacing Threshold Amplitude: 0.25 V
Lead Channel Pacing Threshold Amplitude: 0.5 V
Lead Channel Pacing Threshold Amplitude: 0.5 V
Lead Channel Pacing Threshold Amplitude: 0.5 V
Lead Channel Pacing Threshold Amplitude: 0.75 V
Lead Channel Pacing Threshold Amplitude: 0.75 V
Lead Channel Pacing Threshold Pulse Width: 0.5 ms
Lead Channel Pacing Threshold Pulse Width: 0.5 ms
Lead Channel Pacing Threshold Pulse Width: 0.5 ms
Lead Channel Pacing Threshold Pulse Width: 0.5 ms
Lead Channel Pacing Threshold Pulse Width: 0.5 ms
Lead Channel Pacing Threshold Pulse Width: 0.5 ms
Lead Channel Sensing Intrinsic Amplitude: 12 mV
Lead Channel Sensing Intrinsic Amplitude: 3.4 mV
Lead Channel Setting Pacing Amplitude: 1.125
Lead Channel Setting Pacing Amplitude: 1.5 V
Lead Channel Setting Pacing Amplitude: 2.375
Lead Channel Setting Pacing Pulse Width: 0.5 ms
Lead Channel Setting Pacing Pulse Width: 0.5 ms
Lead Channel Setting Sensing Sensitivity: 0.5 mV
Pulse Gen Serial Number: 211015510
Zone Setting Status: 755011

## 2023-01-14 NOTE — Patient Instructions (Signed)
After Your ICD (Implantable Cardiac Defibrillator)    Monitor your defibrillator site for redness, swelling, and drainage. Call the device clinic at [phone] if you experience these symptoms or fever/chills.  Your incision was closed with Steri-strips or staples:  You may shower 7 days after your procedure and wash your incision with soap and water. Avoid lotions, ointments, or perfumes over your incision until it is well-healed.  You may use a hot tub or a pool after your wound check appointment if the incision is completely closed.  Do not lift, push or pull greater than 10 pounds with the affected arm until 6 weeks after your procedure. UNTIL AFTER NOVEMBER 21ST.  There are no other restrictions in arm movement after your wound check appointment.   Your ICD is designed to protect you from life threatening heart rhythms. Because of this, you may receive a shock.   1 shock with no symptoms:  Call the office during business hours. 1 shock with symptoms (chest pain, chest pressure, dizziness, lightheadedness, shortness of breath, overall feeling unwell):  Call 911. If you experience 2 or more shocks in 24 hours:  Call 911. If you receive a shock, you should not drive.  [place] DMV - no driving for 6 months if you receive appropriate therapy from your ICD.   ICD Alerts:  Some alerts are vibratory and others beep. These are NOT emergencies. Please call our office to let us know. If this occurs at night or on weekends, it can wait until the next business day. Send a remote transmission.  If your device is capable of reading fluid status (for heart failure), you will be offered monthly monitoring to review this with you.   Remote monitoring is used to monitor your ICD from home. This monitoring is scheduled every 91 days by our office. It allows [place] to keep an eye on the functioning of your device to ensure it is working properly. You will routinely see your Electrophysiologist annually (more  often if necessary).

## 2023-01-14 NOTE — Progress Notes (Signed)
Wound check appointment. Steri-strips removed. Wound without redness or edema. Incision edges approximated, wound well healed. Normal device function. BIVP: >99%. Thresholds, sensing, and impedances consistent with implant measurements. Device programmed at 3.5V for extra safety margin until 3 month visit. Histogram distribution appropriate for patient and level of activity. No mode switches or ventricular arrhythmias noted. Patient educated about wound care, arm mobility, lifting restrictions, shock plan. ROV in 3 months with implanting physician.  Patient is set up and transmitting with remote monitoring through APP.  Transmission tested today.

## 2023-01-14 NOTE — Telephone Encounter (Signed)
-----   Message from Laurann Montana sent at 01/08/2023  7:46 AM EDT ----- Please let pt know potassium level is back to normal (outside labs had shown K of 5.3, now 4.2). Creatinine is mildly elevated compared to value in 11/2022 but actually very stable going back to prior baseline including outside labs from [facility] back to 2023, so I suspect this is closer to his baseline. If he has not previously been referred to nephrology to establish care, I would recommend placing referral for chronic kidney disease stage 3. Would also recommend patient avoid meds in NSAID class (like ibuprofen, Advil, Motrin, naproxen, and Aleve) due to risk of worsening kidney function.

## 2023-02-01 NOTE — Telephone Encounter (Signed)
Prior Authorization for Repatha has been APPROVED from 01/12/23 to 07/13/23. See other encounter

## 2023-03-02 NOTE — Progress Notes (Signed)
ADVANCED HEART FAILURE CLINIC NOTE  Referring Physician: Filomena Jungling, NP  Primary Care: Filomena Jungling, NP Primary Cardiologist:  HPI: Steven Brady is a 67 y.o. male with HFrEF, CAD, cardioembolic stroke in 2023, CKD 3, left bundle branch block, CAD status post PCI in 2024 presenting today to establish care.  He was previously followed at [facility] for chronic HFrEF for at least 10 years and stroke in 2022 with EF as low as 10%.  Earlier this year he had a workup for CAD at [facility] where coronary CT showed multivessel CAD and cardiac MRI demonstrating scar in the inferior wall.  He ultimately underwent left heart cath in April 2024 and is now status post PCI to the RCA due to positive FFR.  He presented to [facility] in July 2024 for chest pain.  Echocardiogram at this time showed an EF of 25 to 30% with moderate LVH.  He was started on GDMT and discharged home.  Since that time he has also been seen by electrophysiology with plan for BiV upgrade.  Interval hx:  From a functional standpoint, he reports feeling pretty well; however, he does have 2-3 days/weekly during which he feels very fatigued. Otherwise complaint with medications. No longer smoking. Motivated to improve his overall health.    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   recently hurt left ankle - 2 weeks ago   CAD (coronary artery disease)    CHF (congestive heart failure) (HCC) 03/23/2013   Chronic HFrEF (heart failure with reduced ejection fraction) (HCC)    Chronic kidney disease, stage 3 (HCC)    Hematuria - cause not known 06/08/2011   pt has been seeing blood off and on in urine   Hyperlipidemia Dx 2015    Hypertension Dx 2015   LBBB (left bundle branch block)    Stroke (HCC)    Thrombocytopenia (HCC)    Tobacco abuse    since age of 30    Current Outpatient Medications  Medication Sig Dispense Refill   apixaban (ELIQUIS) 5 MG TABS tablet Take 5 mg by mouth 2 (two) times daily.     atorvastatin (LIPITOR) 80 MG tablet TAKE 1 TABLET BY MOUTH EVERY DAY 30 tablet 0   Brinzolamide-Brimonidine 1-0.2 % SUSP Apply to eye.     carvedilol (COREG) 12.5 MG tablet Take 1 tablet (12.5 mg total) by mouth 2 (two) times daily. 60 tablet 1   Cholecalciferol (VITAMIN D3) 2000 UNITS TABS Take 2,000 Units by mouth daily. 30 tablet 11   clopidogrel (PLAVIX) 75 MG tablet Take 75 mg by mouth in the morning.     Continuous Glucose Receiver (DEXCOM G6 RECEIVER) DEVI by Does not apply route as directed.     Continuous Glucose Sensor (DEXCOM G6 SENSOR) MISC as directed.     Continuous Glucose Transmitter (DEXCOM G6 TRANSMITTER) MISC by Does not apply route as directed.     dapagliflozin propanediol (FARXIGA) 10 MG TABS tablet Take 10 mg by mouth in the morning.     diclofenac Sodium (VOLTAREN) 1 % GEL Apply topically.     Evolocumab (REPATHA SURECLICK) 140 MG/ML SOAJ Inject 140 mg into the skin every 14 (fourteen) days. 6 mL 3   furosemide (LASIX) 20  MG tablet Take 1 tablet (20 mg total) by mouth as needed for fluid or edema. 30 tablet 2   hydrALAZINE (APRESOLINE) 50 MG tablet Take 1 tablet (50 mg total) by mouth 3 (three) times daily. 200 tablet 3   isosorbide mononitrate (IMDUR) 30 MG 24 hr tablet Take 1 tablet (30 mg total) by mouth daily. 90 tablet 3   ketorolac (ACULAR) 0.5 % ophthalmic solution Place one drop into both eyes 3 (three) times a day.     metFORMIN (GLUCOPHAGE) 500 MG tablet Take 500 mg by mouth 2 (two) times daily with a meal.     Multiple Vitamin (MULTIVITAMIN WITH MINERALS) TABS tablet Take 1 tablet by mouth in the morning. Centrum     nitroGLYCERIN (NITROSTAT) 0.3 MG SL tablet Place 1 tablet (0.3  mg total) under the tongue every 5 (five) minutes as needed for chest pain (Only take medication if you have chest pain. Please go to an emergency department if you have chest pain). 10 tablet 0   ofloxacin (OCUFLOX) 0.3 % ophthalmic solution Place one drop into both eyes 2 (two) times daily.     sacubitril-valsartan (ENTRESTO) 97-103 MG Take 1 tablet by mouth 2 (two) times daily.     spironolactone (ALDACTONE) 25 MG tablet Take 25 mg by mouth in the morning.     timolol (TIMOPTIC-XR) 0.5 % ophthalmic gel-forming Apply to eye.     No current facility-administered medications for this visit.    No Known Allergies    Social History   Socioeconomic History   Marital status: Married    Spouse name: Not on file   Number of children: Not on file   Years of education: Not on file   Highest education level: Not on file  Occupational History   Not on file  Tobacco Use   Smoking status: Light Smoker    Current packs/day: 0.25    Types: Cigarettes   Smokeless tobacco: Never  Substance and Sexual Activity   Alcohol use: No    Alcohol/week: 0.0 standard drinks of alcohol   Drug use: Yes    Types: Marijuana   Sexual activity: Not on file  Other Topics Concern   Not on file  Social History Narrative   Not on file   Social Determinants of Health   Financial Resource Strain: Low Risk  (03/26/2022)   Received from [facility], [facility]   Overall Financial Resource Strain (CARDIA)    Difficulty of Paying Living Expenses: Not hard at all  Food Insecurity: Low Risk  (02/03/2023)   Received from [facility]   Hunger Vital Sign    Worried About Running Out of Food in the Last Year: Never true    Ran Out of Food in the Last Year: Never true  Transportation Needs: No Transportation Needs (02/03/2023)   Received from [facility]    In the past 12 months, has lack of reliable transportation kept you from medical appointments, meetings, work or from getting things  needed for daily living? : No  Physical Activity: Not on file  Stress: Not on file  Social Connections: Unknown (08/05/2021)   Received from [facility], [facility]   Social Network    Social Network: Not on file  Intimate Partner Violence: Unknown (06/27/2021)   Received from [facility], [facility]   HITS    Physically Hurt: Not on file    Insult or Talk Down To: Not on file    Threaten  Physical Harm: Not on file    Scream or Curse: Not on file      Family History  Problem Relation Age of Onset   Dementia Mother    Dementia Father     PHYSICAL EXAM: There were no vitals filed for this visit. GENERAL: Well nourished, well developed, and in no apparent distress at rest.  HEENT: Negative for arcus senilis or xanthelasma. There is no scleral icterus.  The mucous membranes are pink and moist.   NECK: Supple, No masses. Normal carotid upstrokes without bruits. No masses or thyromegaly.    CHEST: There are no chest wall deformities. There is no chest wall tenderness. Respirations are unlabored.  Lungs- *** CARDIAC:  JVP: *** cm          Normal rate with regular rhythm. No murmurs, rubs or gallops.  Pulses are 2+ and symmetrical in upper and lower extremities. *** edema.  ABDOMEN: Soft, non-tender, non-distended. There are no masses or hepatomegaly. There are normal bowel sounds.  EXTREMITIES: Warm and well perfused with no cyanosis, clubbing.  LYMPHATIC: No axillary or supraclavicular lymphadenopathy.  NEUROLOGIC: Patient is oriented x3 with no focal or lateralizing neurologic deficits.  PSYCH: Patients affect is appropriate, there is no evidence of anxiety or depression.  SKIN: Warm and dry; no lesions or wounds.   DATA REVIEW  ECG: 09/25/22: NSR w/ LBBB  As per my personal interpretation  ECHO: 09/26/22: LVEF 25-30% w/ prominent IVS dyssynchrony due to LBBB as per my personal interpretation  CATH: 07/13/22: Left Anterior Descending: The vessel exhibits minimal luminal  irregularities.  Left Circumflex: The vessel exhibits minimal luminal irregularities. Dist Cx lesion is 70% stenosed. Third Obtuse Marginal Branch: 3rd Mrg lesion is 35% stenosed.  Right Coronary Artery: Ost RCA to Prox RCA lesion is 30% stenosed. Prox RCA lesion is 70% stenosed. Lesion length: 13 mm. TIMI flow is 3. The lesion is not complex (non high-C). Mid RCA lesion is 70% stenosed. Lesion length: 15 mm. TIMI flow is 3. The lesion is not complex (non high-C). Dist RCA lesion is 70% stenosed. Lesion length: 20 mm. TIMI flow is 3. The lesion is not complex (non high-C). Pressure wire/FFR was performed on the lesion. FFR: 0.79. Right Posterior Descending Artery: RPDA lesion is 30% stenosed.  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  Status post PCI x 3 to the RCA.  Diffuse reduction in LVEF however is consistent with a nonischemic process. NYHA class / AHA Stage:IIB Volume status & Diuretics: Euvolemic to mildly hypervolemic; continue lasix 20mg  PRN.  Vasodilators: Entresto 97/93 mg twice daily, hydralazine 50 mg 3 times daily, add imdur 30mg  daily.  Beta-Blocker: Coreg 12.5 mg twice daily MRA: Spironolactone 25 mg daily Cardiometabolic: Farxiga 10 mg daily Devices therapies & Valvulopathies: Planning to undergo BiV device by Dr. Nelly Laurence Advanced therapies: Will continue follow-up.  Currently not a candidate.  2.  Coronary artery disease -Status post PCI to the RCA x 3.  Left heart cath noted above.  3.  History of cardioembolic stroke -Currently on Eliquis and Plavix. -Will plan on discontinuing Eliquis within the next few months.  4. Hyperlipidemia - LDL remains above goal - Refer for PCSK9i   Kalvyn Desa Advanced Heart Failure Mechanical Circulatory Support

## 2023-03-03 ENCOUNTER — Other Ambulatory Visit: Payer: Self-pay | Admitting: Internal Medicine

## 2023-03-03 ENCOUNTER — Encounter (HOSPITAL_COMMUNITY): Payer: Self-pay | Admitting: Cardiology

## 2023-03-03 ENCOUNTER — Ambulatory Visit (HOSPITAL_COMMUNITY)
Admission: RE | Admit: 2023-03-03 | Discharge: 2023-03-03 | Disposition: A | Discharge status: Home / Self Care | Payer: 59 | Source: Ambulatory Visit | Attending: Cardiology | Admitting: Cardiology

## 2023-03-03 VITALS — BP 104/70 | HR 71 | Wt 196.0 lb

## 2023-03-03 DIAGNOSIS — Z006 Encounter for examination for normal comparison and control in clinical research program: Secondary | ICD-10-CM

## 2023-03-03 DIAGNOSIS — I130 Hypertensive heart and chronic kidney disease with heart failure and stage 1 through stage 4 chronic kidney disease, or unspecified chronic kidney disease: Secondary | ICD-10-CM | POA: Diagnosis not present

## 2023-03-03 DIAGNOSIS — G629 Polyneuropathy, unspecified: Secondary | ICD-10-CM

## 2023-03-03 DIAGNOSIS — Z8673 Personal history of transient ischemic attack (TIA), and cerebral infarction without residual deficits: Secondary | ICD-10-CM

## 2023-03-03 DIAGNOSIS — N183 Chronic kidney disease, stage 3 unspecified: Secondary | ICD-10-CM | POA: Insufficient documentation

## 2023-03-03 DIAGNOSIS — I428 Other cardiomyopathies: Secondary | ICD-10-CM | POA: Diagnosis not present

## 2023-03-03 DIAGNOSIS — Z7984 Long term (current) use of oral hypoglycemic drugs: Secondary | ICD-10-CM | POA: Insufficient documentation

## 2023-03-03 DIAGNOSIS — E1122 Type 2 diabetes mellitus with diabetic chronic kidney disease: Secondary | ICD-10-CM | POA: Diagnosis not present

## 2023-03-03 DIAGNOSIS — Z7901 Long term (current) use of anticoagulants: Secondary | ICD-10-CM | POA: Insufficient documentation

## 2023-03-03 DIAGNOSIS — I2510 Atherosclerotic heart disease of native coronary artery without angina pectoris: Secondary | ICD-10-CM | POA: Diagnosis not present

## 2023-03-03 DIAGNOSIS — Z955 Presence of coronary angioplasty implant and graft: Secondary | ICD-10-CM | POA: Diagnosis not present

## 2023-03-03 DIAGNOSIS — F17210 Nicotine dependence, cigarettes, uncomplicated: Secondary | ICD-10-CM | POA: Diagnosis not present

## 2023-03-03 DIAGNOSIS — N1831 Chronic kidney disease, stage 3a: Secondary | ICD-10-CM

## 2023-03-03 DIAGNOSIS — Z95810 Presence of automatic (implantable) cardiac defibrillator: Secondary | ICD-10-CM | POA: Diagnosis not present

## 2023-03-03 DIAGNOSIS — E785 Hyperlipidemia, unspecified: Secondary | ICD-10-CM | POA: Diagnosis not present

## 2023-03-03 DIAGNOSIS — E1142 Type 2 diabetes mellitus with diabetic polyneuropathy: Secondary | ICD-10-CM | POA: Insufficient documentation

## 2023-03-03 DIAGNOSIS — I255 Ischemic cardiomyopathy: Secondary | ICD-10-CM | POA: Diagnosis not present

## 2023-03-03 DIAGNOSIS — E118 Type 2 diabetes mellitus with unspecified complications: Secondary | ICD-10-CM

## 2023-03-03 DIAGNOSIS — I5022 Chronic systolic (congestive) heart failure: Secondary | ICD-10-CM | POA: Diagnosis present

## 2023-03-03 DIAGNOSIS — Z7902 Long term (current) use of antithrombotics/antiplatelets: Secondary | ICD-10-CM | POA: Insufficient documentation

## 2023-03-03 DIAGNOSIS — Z79899 Other long term (current) drug therapy: Secondary | ICD-10-CM | POA: Diagnosis not present

## 2023-03-03 LAB — BASIC METABOLIC PANEL
Anion gap: 6 (ref 5–15)
BUN: 26 mg/dL — ABNORMAL HIGH (ref 8–23)
CO2: 25 mmol/L (ref 22–32)
Calcium: 10.4 mg/dL — ABNORMAL HIGH (ref 8.9–10.3)
Chloride: 105 mmol/L (ref 98–111)
Creatinine, Ser: 1.77 mg/dL — ABNORMAL HIGH (ref 0.61–1.24)
GFR, Estimated: 42 mL/min — ABNORMAL LOW (ref >60)
Glucose, Bld: 116 mg/dL — ABNORMAL HIGH (ref 70–99)
Potassium: 5.1 mmol/L (ref 3.5–5.1)
Sodium: 136 mmol/L (ref 135–145)

## 2023-03-03 LAB — ECHOCARDIOGRAM COMPLETE
Area-P 1/2: 3.5 cm2
Calc EF: 31.9 %
S' Lateral: 3.9 cm
Single Plane A2C EF: 31.6 %
Single Plane A4C EF: 33.4 %

## 2023-03-03 LAB — BRAIN NATRIURETIC PEPTIDE: B Natriuretic Peptide: 72.3 pg/mL (ref 0.0–100.0)

## 2023-03-03 LAB — HEMOGLOBIN A1C
Hgb A1c MFr Bld: 6.5 % — ABNORMAL HIGH (ref 4.8–5.6)
Mean Plasma Glucose: 139.85 mg/dL

## 2023-03-03 MED ORDER — HYDRALAZINE HCL 25 MG PO TABS: 25.0000 mg | ORAL_TABLET | Freq: Three times a day (TID) | ORAL | 11 refills | Status: DC

## 2023-03-03 NOTE — Patient Instructions (Signed)
DECREASE Hydralazine to 25 mg Three times a day  Labs done today, your results will be available in MyChart, we will contact you for abnormal readings.  Your physician recommends that you schedule a follow-up appointment in: 3 months   If you have any questions or concerns before your next appointment please send [place] a message through [place] or call our office at [phone].    TO LEAVE A MESSAGE FOR THE NURSE SELECT OPTION 2, PLEASE LEAVE A MESSAGE INCLUDING: YOUR NAME DATE OF BIRTH CALL BACK NUMBER REASON FOR CALL**this is important as we prioritize the call backs  YOU WILL RECEIVE A CALL BACK THE SAME DAY AS LONG AS YOU CALL BEFORE 4:00 PM  At the [facility], you and your health needs are our priority. As part of our continuing mission to provide you with exceptional heart care, we have created designated Provider Care Teams. These Care Teams include your primary Cardiologist (physician) and Advanced Practice Providers (APPs- Physician Assistants and Nurse Practitioners) who all work together to provide you with the care you need, when you need it.   You may see any of the following providers on your designated Care Team at your next follow up: Dr Arvilla Meres Dr Marca Ancona Dr. Dorthula Nettles Dr. Clearnce Hasten Amy Filbert Schilder, NP Robbie Lis, [place] [facility], [place] Brynda Peon, NP [place] Lee, NP Karle Plumber, PharmD   Please be sure to bring in all your medications bottles to every appointment.    Thank you for choosing [facility]

## 2023-03-03 NOTE — Research (Signed)
SITE: [id number]     Subject #[id number]   Subprotocol: A  Inclusion Criteria  Patients who meet all of the following criteria are eligible for enrollment as study participants:  Yes No  Age > 65 years old X   Eligible to wear Holter Study X    Exclusion Criteria  Patients who meet any of these criteria are not eligible for enrollment as study participants: Yes No  1. Receiving any mechanical (respiratory or circulatory) or renal support therapy at Screening or during Visit #1.  X  2.  Any other conditions that in the opinion of the investigators are likely to prevent compliance with the study protocol or pose a safety concern if the subject participates in the study.  X  3. Poor tolerance, namely susceptible to severe skin allergies from ECG adhesive patch application.  X   Protocol: REV H                                     Residential Zip code [address] (First 3 digits ONLY)                                             [facility] Informed Consent   Subject Name: Lakeisha Schwiebert  Subject met inclusion and exclusion criteria.  The informed consent form, study requirements and expectations were reviewed with the subject. Subject had opportunity to read consent and questions and concerns were addressed prior to the signing of the consent form.  The subject verbalized understanding of the trial requirements.  The subject agreed to participate in the [facility] EF ACT trial and signed the informed consent at 13:14 on 03-Mar-2023.  The informed consent was obtained prior to performance of any protocol-specific procedures for the subject.  A copy of the signed informed consent was given to the subject and a copy was placed in the subject's medical record.   Dyanne Iha          Current Outpatient Medications:    apixaban (ELIQUIS) 5 MG TABS tablet, Take 5 mg by mouth 2 (two) times daily., Disp: , Rfl:    atorvastatin (LIPITOR) 80 MG tablet, TAKE 1 TABLET BY MOUTH EVERY DAY, Disp: 30 tablet, Rfl: 0    Brinzolamide-Brimonidine 1-0.2 % SUSP, Apply to eye., Disp: , Rfl:    carvedilol (COREG) 12.5 MG tablet, Take 1 tablet (12.5 mg total) by mouth 2 (two) times daily., Disp: 60 tablet, Rfl: 1   Cholecalciferol (VITAMIN D3) 2000 UNITS TABS, Take 2,000 Units by mouth daily., Disp: 30 tablet, Rfl: 11   clopidogrel (PLAVIX) 75 MG tablet, Take 75 mg by mouth in the morning., Disp: , Rfl:    Continuous Glucose Receiver (DEXCOM G6 RECEIVER) DEVI, by Does not apply route as directed., Disp: , Rfl:    Continuous Glucose Sensor (DEXCOM G6 SENSOR) MISC, as directed., Disp: , Rfl:    Continuous Glucose Transmitter (DEXCOM G6 TRANSMITTER) MISC, by Does not apply route as directed., Disp: , Rfl:    dapagliflozin propanediol (FARXIGA) 10 MG TABS tablet, Take 10 mg by mouth in the morning., Disp: , Rfl:    diclofenac Sodium (VOLTAREN) 1 % GEL, Apply topically., Disp: , Rfl:    Evolocumab (REPATHA SURECLICK) 140 MG/ML SOAJ, Inject 140 mg into the skin every 14 (fourteen) days., Disp:  6 mL, Rfl: 3   furosemide (LASIX) 20 MG tablet, Take 1 tablet (20 mg total) by mouth as needed for fluid or edema., Disp: 30 tablet, Rfl: 2   hydrALAZINE (APRESOLINE) 50 MG tablet, Take 1 tablet (50 mg total) by mouth 3 (three) times daily., Disp: 200 tablet, Rfl: 3   isosorbide mononitrate (IMDUR) 30 MG 24 hr tablet, Take 1 tablet (30 mg total) by mouth daily., Disp: 90 tablet, Rfl: 3   ketorolac (ACULAR) 0.5 % ophthalmic solution, Place one drop into both eyes 3 (three) times a day., Disp: , Rfl:    metFORMIN (GLUCOPHAGE) 500 MG tablet, Take 500 mg by mouth 2 (two) times daily with a meal., Disp: , Rfl:    Multiple Vitamin (MULTIVITAMIN WITH MINERALS) TABS tablet, Take 1 tablet by mouth in the morning. Centrum, Disp: , Rfl:    nitroGLYCERIN (NITROSTAT) 0.3 MG SL tablet, Place 1 tablet (0.3 mg total) under the tongue every 5 (five) minutes as needed for chest pain (Only take medication if you have chest pain. Please go to an emergency  department if you have chest pain)., Disp: 10 tablet, Rfl: 0   ofloxacin (OCUFLOX) 0.3 % ophthalmic solution, Place one drop into both eyes 2 (two) times daily., Disp: , Rfl:    sacubitril-valsartan (ENTRESTO) 97-103 MG, Take 1 tablet by mouth 2 (two) times daily., Disp: , Rfl:    spironolactone (ALDACTONE) 25 MG tablet, Take 25 mg by mouth in the morning., Disp: , Rfl:    timolol (TIMOPTIC-XR) 0.5 % ophthalmic gel-forming, Apply to eye., Disp: , Rfl:

## 2023-03-03 NOTE — Progress Notes (Signed)
  Echocardiogram 2D Echocardiogram has been performed.  [name] 03/03/2023, 1:58 PM

## 2023-03-09 ENCOUNTER — Inpatient Hospital Stay
Admission: RE | Admit: 2023-03-09 | Discharge: 2023-03-09 | Discharge status: Home / Self Care | Payer: 59 | Source: Ambulatory Visit | Attending: Internal Medicine | Admitting: Internal Medicine

## 2023-03-09 DIAGNOSIS — N1831 Chronic kidney disease, stage 3a: Secondary | ICD-10-CM

## 2023-03-15 ENCOUNTER — Ambulatory Visit: Payer: 59 | Admitting: Dietician

## 2023-03-22 ENCOUNTER — Telehealth: Payer: Self-pay

## 2023-03-22 ENCOUNTER — Encounter: Payer: Self-pay | Admitting: Cardiovascular Disease

## 2023-03-22 NOTE — Telephone Encounter (Signed)
   Pre-operative Risk Assessment    Patient Name: Steven Brady  DOB: [birth year] MRN: [id number]   Date of last office visit: 01/07/2023 Ronie Spies, PA-C Date of next office visit: 04/05/2023 Canary Brim, NP   Request for Surgical Clearance    Procedure:   Circumcision  Date of Surgery:  Clearance TBD - once clearance is obtained                         Surgeon:  Dr. Leta Jungling Group or Practice Name:  [facility]  Phone number:  [phone] Fax number:  [phone]   Type of Clearance Requested:   - Medical  - Pharmacy:  Hold Apixaban (Eliquis) 3 days before procedure   Type of Anesthesia:  Not Indicated   Additional requests/questions:  Notes per [facility] office : Patient had a defibrillator placed around a 1 month ago, clearance needed for this as well.  Elyse Jarvis   03/22/2023, 2:40 PM

## 2023-03-22 NOTE — Progress Notes (Signed)
PERIOPERATIVE PRESCRIPTION FOR IMPLANTED CARDIAC DEVICE PROGRAMMING   Patient Information:  Patient: Steven Brady  MRN: [id number]  Date of Birth: 1955/10/16     Surgeon:  Dr. Leta Jungling Group or Practice Name:  [facility]  Phone number:  [phone] Fax number:  [phone] Planned Procedure:  Circumcision   Date of Procedure:  Clearance TBD - once clearance is obtained      Device Information:   Clinic EP Physician:   Dr. York Pellant Device Type:  Defibrillator Manufacturer and Phone #:  [employer] Jude/Abbott: [phone] Pacemaker Dependent?:  No Date of Last Device Check:  01/14/23        Normal Device Function?:  Yes     Electrophysiologist's Recommendations:   Have magnet available. Provide continuous ECG monitoring when magnet is used or reprogramming is to be performed.  Procedure should not interfere with device function.  No device programming or magnet placement needed.  Per Device Clinic Standing Orders, [name]  03/22/2023 3:19 PM

## 2023-03-23 NOTE — Telephone Encounter (Signed)
 Patient with diagnosis of prior CVA on Eliquis  for anticoagulation.    Procedure: circumcision Date of procedure: TBD  CrCl 53 Platelet count 115  Per office protocol, patient can hold Eliquis  for 3 days prior to procedure.   Patient will not need bridging with Lovenox  (enoxaparin ) around procedure.  **This guidance is not considered finalized until pre-operative APP has relayed final recommendations.**

## 2023-03-25 NOTE — Telephone Encounter (Signed)
 Dr. Gardenia, patient is pending circumcision, date ASAP. He was seen by you on 03/03/2023. Do you agree that from a medical perspective, he may proceed with procedure without further cardiac testing?  Please route your response to p cv div preop.  Thank you, Rosaline EMERSON Bane, NP-C  03/25/2023, 9:11 AM [address], [address] Office [phone] Fax [phone]

## 2023-03-25 NOTE — Telephone Encounter (Signed)
 Surgeon's office has faxed over a duplicate request inquiring if pt has been cleared. I will update this back to the preop APP for review.

## 2023-03-26 NOTE — Telephone Encounter (Signed)
   Primary Cardiologist: Lonni Cash, MD  Chart reviewed as part of pre-operative protocol coverage. Given past medical history and time since last visit, based on ACC/AHA guidelines, Steven Brady would be at acceptable risk for the planned procedure without further cardiovascular testing.   Patient was advised that if he develops new symptoms prior to surgery to contact our office to arrange a follow-up appointment.  He verbalized understanding.  Per office protocol, patient can hold Eliquis  for 3 days prior to procedure.   Patient will not need bridging with Lovenox  (enoxaparin ) around procedure.  I will route this recommendation to the requesting party via Epic fax function and remove from pre-op  pool.  Please call with questions.  Rosaline EMERSON Bane, NP-C  03/26/2023, 7:10 AM [address] Office [phone] Fax [phone]

## 2023-04-01 ENCOUNTER — Ambulatory Visit (INDEPENDENT_AMBULATORY_CARE_PROVIDER_SITE_OTHER): Payer: 59

## 2023-04-01 DIAGNOSIS — I447 Left bundle-branch block, unspecified: Secondary | ICD-10-CM | POA: Diagnosis not present

## 2023-04-03 LAB — CUP PACEART REMOTE DEVICE CHECK
Battery Remaining Longevity: 91 mo
Battery Remaining Percentage: 93 %
Battery Voltage: 3.02 V
Brady Statistic AP VP Percent: 2.8 %
Brady Statistic AP VS Percent: 1 %
Brady Statistic AS VP Percent: 97 %
Brady Statistic AS VS Percent: 1 %
Brady Statistic RA Percent Paced: 2.7 %
Date Time Interrogation Session: 20250110152255
HighPow Impedance: 70 Ohm
Implantable Lead Connection Status: 753985
Implantable Lead Connection Status: 753985
Implantable Lead Connection Status: 753985
Implantable Lead Implant Date: 20241010
Implantable Lead Implant Date: 20241010
Implantable Lead Implant Date: 20241010
Implantable Lead Location: 753858
Implantable Lead Location: 753859
Implantable Lead Location: 753860
Implantable Pulse Generator Implant Date: 20241010
Lead Channel Impedance Value: 380 Ohm
Lead Channel Impedance Value: 500 Ohm
Lead Channel Impedance Value: 980 Ohm
Lead Channel Pacing Threshold Amplitude: 0.625 V
Lead Channel Pacing Threshold Amplitude: 0.75 V
Lead Channel Pacing Threshold Amplitude: 0.875 V
Lead Channel Pacing Threshold Pulse Width: 0.5 ms
Lead Channel Pacing Threshold Pulse Width: 0.5 ms
Lead Channel Pacing Threshold Pulse Width: 0.5 ms
Lead Channel Sensing Intrinsic Amplitude: 12 mV
Lead Channel Sensing Intrinsic Amplitude: 3.7 mV
Lead Channel Setting Pacing Amplitude: 1.125
Lead Channel Setting Pacing Amplitude: 1.75 V
Lead Channel Setting Pacing Amplitude: 1.875
Lead Channel Setting Pacing Pulse Width: 0.5 ms
Lead Channel Setting Pacing Pulse Width: 0.5 ms
Lead Channel Setting Sensing Sensitivity: 0.5 mV
Pulse Gen Serial Number: 211015510
Zone Setting Status: 755011

## 2023-04-04 NOTE — Progress Notes (Signed)
 Electrophysiology Office Note:   Date:  04/05/2023  ID:  Steven Brady, DOB 1955/10/06, MRN [id number]  Primary Cardiologist: Lonni Cash, MD Primary Heart Failure: None Electrophysiologist: Eulas FORBES Furbish, MD       History of Present Illness:   Kaiser Belluomini is a 68 y.o. male with h/o HFrEF, ICM with LBBB, NSVT s/p BiV ICD, HTN,  HLD, CVA (2022), tobacco abuse, seen today for routine electrophysiology followup.   He underwent placement of BiV ICD 12/31/22 by Dr. Furbish. ECHO in 02/2023 showed LVEF 30-35%, & was unable to rule out LV thrombus.    Since last being seen in our clinic the patient reports he thinks he felt immediately better after the procedure. He notes his device healed well and only occasionally had itching while healing. No issues now. Recently had cataract surgery. Notes he has had vertigo for many years.   He denies chest pain, palpitations, dyspnea, PND, orthopnea, nausea, vomiting, dizziness, syncope, edema, weight gain, or early satiety.   Review of systems complete and found to be negative unless listed in HPI.   EP Information / Studies Reviewed:    EKG is ordered today. Personal review as below.  EKG Interpretation Date/Time:  Monday April 05 2023 09:55:50 EST Ventricular Rate:  68 PR Interval:  164 QRS Duration:  122 QT Interval:  430 QTC Calculation: 457 R Axis:   35  Text Interpretation: Atrial-sensed ventricular-paced rhythm Confirmed by Aniceto Jarvis ([id number]) on 04/05/2023 10:23:13 AM   ICD Interrogation-  reviewed in detail today,  See PACEART report.  Device History: Abbott BiV ICD implanted 12/31/22 for ICM, LBBB History of appropriate therapy: No History of AAD therapy: No   Studies:  cMRI 4/20204 ([facility]) > LVEF 38%, diffuse patchy scarring of the LV wall, particularly the lateral wall, in a non-coronary disease pattern  ECHO 02/2023 > LVEF 30-35%, can not entirely r/o LV thrombus, LV global hypokineis, no valvular  disease     Arrhythmia / AAD NSVT     Risk Assessment/Calculations:              Physical Exam:   VS:  BP 122/88   Pulse 66   Ht 5' 7 (1.702 m)   Wt 200 lb (90.7 kg)   SpO2 95%   BMI 31.32 kg/m    Wt Readings from Last 3 Encounters:  04/05/23 200 lb (90.7 kg)  03/03/23 196 lb (88.9 kg)  01/07/23 199 lb 6.4 oz (90.4 kg)     GEN: Well nourished, well developed in no acute distress NECK: No JVD; No carotid bruits CARDIAC: Regular rate and rhythm, no murmurs, rubs, gallops. Site well healed, no edema / erythema.  RESPIRATORY:  Clear to auscultation without rales, wheezing or rhonchi  ABDOMEN: Soft, non-tender, non-distended EXTREMITIES:  No edema; No deformity   ASSESSMENT AND PLAN:    Chronic Systolic Dysfunction s/p Abbott CRT-D  LBBB Mixed ICM  / NICM Cardiomyopathy NYHA II, Cardiac scar on MRI  -euvolemic today -Stable on an appropriate medical regimen -Normal ICD function -See Pace Art report -GDMT per Advanced HF Team  -plan for repeat ECHO at the end of February (~4 mo after implant) to review LV function post BiV pacing  -No changes today ->99%% BiV pacing  -0% burden of AF  Possible LV Thrombus  Seen on ECHO 02/2023 -on OAC at baseline, continue apixiban 5mg  BID    CAD  -Multi-vessel CAD, s/p PCI to RCA 06/2022  -no anginal symptoms   CVA  Presumed  cardioembolic, in 2022.  -no AF on device    -monitor for AF via device    Disposition:   Follow up with Dr. Nancey in 6 months   Signed, Daphne Barrack, MSN, APRN, NP-C, AGACNP-BC [facility] - Electrophysiology  04/05/2023, 5:24 PM

## 2023-04-05 ENCOUNTER — Ambulatory Visit: Payer: 59 | Attending: Physician Assistant | Admitting: Pulmonary Disease

## 2023-04-05 ENCOUNTER — Encounter: Payer: Self-pay | Admitting: Pulmonary Disease

## 2023-04-05 VITALS — BP 122/88 | HR 66 | Ht 67.0 in | Wt 200.0 lb

## 2023-04-05 DIAGNOSIS — Z95810 Presence of automatic (implantable) cardiac defibrillator: Secondary | ICD-10-CM

## 2023-04-05 DIAGNOSIS — I4729 Other ventricular tachycardia: Secondary | ICD-10-CM

## 2023-04-05 DIAGNOSIS — I5022 Chronic systolic (congestive) heart failure: Secondary | ICD-10-CM | POA: Diagnosis not present

## 2023-04-05 DIAGNOSIS — I447 Left bundle-branch block, unspecified: Secondary | ICD-10-CM | POA: Diagnosis not present

## 2023-04-05 DIAGNOSIS — I255 Ischemic cardiomyopathy: Secondary | ICD-10-CM | POA: Diagnosis not present

## 2023-04-05 DIAGNOSIS — Z8673 Personal history of transient ischemic attack (TIA), and cerebral infarction without residual deficits: Secondary | ICD-10-CM

## 2023-04-05 LAB — CUP PACEART INCLINIC DEVICE CHECK
Battery Remaining Longevity: 92 mo
Brady Statistic RA Percent Paced: 2.9 %
Brady Statistic RV Percent Paced: 99.52 %
Date Time Interrogation Session: 20250113174004
HighPow Impedance: 74.25 Ohm
Implantable Lead Connection Status: 753985
Implantable Lead Connection Status: 753985
Implantable Lead Connection Status: 753985
Implantable Lead Implant Date: 20241010
Implantable Lead Implant Date: 20241010
Implantable Lead Implant Date: 20241010
Implantable Lead Location: 753858
Implantable Lead Location: 753859
Implantable Lead Location: 753860
Implantable Pulse Generator Implant Date: 20241010
Lead Channel Impedance Value: 1000 Ohm
Lead Channel Impedance Value: 400 Ohm
Lead Channel Impedance Value: 537.5 Ohm
Lead Channel Pacing Threshold Amplitude: 0.625 V
Lead Channel Pacing Threshold Amplitude: 0.875 V
Lead Channel Pacing Threshold Amplitude: 0.875 V
Lead Channel Pacing Threshold Pulse Width: 0.5 ms
Lead Channel Pacing Threshold Pulse Width: 0.5 ms
Lead Channel Pacing Threshold Pulse Width: 0.5 ms
Lead Channel Sensing Intrinsic Amplitude: 12 mV
Lead Channel Sensing Intrinsic Amplitude: 4.7 mV
Lead Channel Setting Pacing Amplitude: 1.125
Lead Channel Setting Pacing Amplitude: 1.875
Lead Channel Setting Pacing Amplitude: 1.875
Lead Channel Setting Pacing Pulse Width: 0.5 ms
Lead Channel Setting Pacing Pulse Width: 0.5 ms
Lead Channel Setting Sensing Sensitivity: 0.5 mV
Pulse Gen Serial Number: 211015510
Zone Setting Status: 755011

## 2023-04-05 NOTE — Patient Instructions (Signed)
 Medication Instructions:  Your physician recommends that you continue on your current medications as directed. Please refer to the Current Medication list given to you today.  *If you need a refill on your cardiac medications before your next appointment, please call your pharmacy*  Lab Work: None ordered If you have labs (blood work) drawn today and your tests are completely normal, you will receive your results only by: MyChart Message (if you have MyChart) OR A paper copy in the mail If you have any lab test that is abnormal or we need to change your treatment, we will call you to review the results.  Testing/Procedures: Your physician has requested that you have an echocardiogram at the end of February. Echocardiography is a painless test that uses sound waves to create images of your heart. It provides your doctor with information about the size and shape of your heart and how well your heart's chambers and valves are working. This procedure takes approximately one hour. There are no restrictions for this procedure. Please do NOT wear cologne, perfume, aftershave, or lotions (deodorant is allowed). Please arrive 15 minutes prior to your appointment time.  Please note: We ask at that you not bring children with you during ultrasound (echo/ vascular) testing. Due to room size and safety concerns, children are not allowed in the ultrasound rooms during exams. Our front office staff cannot provide observation of children in our lobby area while testing is being conducted. An adult accompanying a patient to their appointment will only be allowed in the ultrasound room at the discretion of the ultrasound technician under special circumstances. We apologize for any inconvenience.   Follow-Up: At [facility], you and your health needs are our priority.  As part of our continuing mission to provide you with exceptional heart care, we have created designated Provider Care Teams.  These Care  Teams include your primary Cardiologist (physician) and Advanced Practice Providers (APPs -  Physician Assistants and Nurse Practitioners) who all work together to provide you with the care you need, when you need it.  Your next appointment:   6 month(s)  Provider:   Daphne Barrack, NP

## 2023-04-17 ENCOUNTER — Encounter: Payer: Self-pay | Admitting: Cardiovascular Disease

## 2023-04-23 ENCOUNTER — Telehealth: Payer: Self-pay | Admitting: Pharmacist

## 2023-04-23 NOTE — Telephone Encounter (Signed)
Call to remind for follow up lipid lab, N/A LVM

## 2023-04-28 ENCOUNTER — Other Ambulatory Visit: Payer: Self-pay | Admitting: Urology

## 2023-04-29 ENCOUNTER — Ambulatory Visit
Admission: EM | Admit: 2023-04-29 | Discharge: 2023-04-29 | Disposition: A | Discharge status: Home / Self Care | Payer: 59 | Attending: Family Medicine | Admitting: Family Medicine

## 2023-04-29 DIAGNOSIS — U071 COVID-19: Secondary | ICD-10-CM | POA: Diagnosis not present

## 2023-04-29 LAB — POCT INFLUENZA A/B
Influenza A, POC: NEGATIVE
Influenza B, POC: NEGATIVE

## 2023-04-29 LAB — POC SARS CORONAVIRUS 2 AG -  ED: SARS Coronavirus 2 Ag: POSITIVE — AB

## 2023-04-29 MED ORDER — PAXLOVID (300/100) 20 X 150 MG & 10 X 100MG PO TBPK: 3.0000 | ORAL_TABLET | Freq: Two times a day (BID) | ORAL | 0 refills | Status: AC

## 2023-04-29 NOTE — Discharge Instructions (Addendum)
 Take Paxlovid  2 times a day  Reduce the Eliquis  by 50% and take 1/2 a tab 2 x a day  While you are on Paxlovid  do not take a atorvastatin .  Stop this medicine for 5 days  Drink lots of fluids  Call your doctors regarding the surgery schedule

## 2023-04-29 NOTE — ED Provider Notes (Signed)
 [facility]    CSN: [id number] Arrival date & time: 04/29/23  1540      History   Chief Complaint Chief Complaint  Patient presents with   URI    HPI Steven Brady is a 68 y.o. male.   Patient states he has had headache and bodyaches, fever cough and congestion for the last 48 hours.  He is here hoping for flu and COVID testing.  No known exposure to illness.  No chest pain or shortness of breath.  Has not taken his temperature. Patient is scheduled for urologic surgery next week.  He hopes to be well enough to accomplish this. Patient does have a history of coronary artery disease, congestive heart failure, stroke, hyperlipidemia and hypertension.  Multiple medications are reviewed.    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   recently hurt left ankle - 2 weeks ago   CAD (coronary artery disease)    CHF (congestive heart failure) (HCC) 03/23/2013   Chronic HFrEF (heart failure with reduced ejection fraction) (HCC)    Chronic kidney disease, stage 3 (HCC)    Hematuria - cause not known 06/08/2011   pt has been seeing blood off and on in urine   Hyperlipidemia Dx 2015   Hypertension Dx 2015   LBBB (left bundle branch block)    Stroke (HCC)    Thrombocytopenia (HCC)    Tobacco abuse    since age of 33    Patient Active Problem List   Diagnosis Date Noted   CVA (cerebral vascular accident) (HCC) 09/26/2022   Chest pain due to CAD (HCC) 09/25/2022   Vitamin D  insufficiency 07/31/2014   Screening for HIV (human immunodeficiency virus) 07/30/2014   Allergic rhinitis 07/30/2014   Tinnitus 07/30/2014   Excessive cerumen in right ear canal 07/30/2014   Vertigo    Multiple lacunar infarcts (HCC)    Tobacco abuse    Hyperlipidemia    Hypertension     Past Surgical History:  Procedure Laterality Date   BIV ICD INSERTION CRT-D N/A 12/31/2022   Procedure: BIV ICD INSERTION CRT-D;  Surgeon: Nancey Eulas BRAVO, MD;  Location: [facility]  INVASIVE CV LAB;  Service: Cardiovascular;  Laterality: N/A;   NO PAST SURGERIES         Home Medications    Prior to Admission medications   Medication Sig Start Date End Date Taking? Authorizing Provider  nirmatrelvir/ritonavir (PAXLOVID , 300/100,) 20 x 150 MG & 10 x 100MG  TBPK Take 3 tablets by mouth 2 (two) times daily for 5 days. Patient GFR is 66. Take nirmatrelvir (150 mg) two tablets twice daily for 5 days and ritonavir (100 mg) one tablet twice daily for 5 days. 04/29/23 05/04/23 Yes Maranda Jamee Jacob, MD  apixaban  (ELIQUIS ) 5 MG TABS tablet Take 5 mg by mouth 2 (two) times daily.    [provider]  atorvastatin  (LIPITOR) 80 MG tablet TAKE 1 TABLET BY MOUTH EVERY DAY 11/12/22   Kandis Perkins, DO  carvedilol  (COREG ) 12.5 MG tablet Take 1 tablet (12.5 mg total) by mouth 2 (two) times daily. 07/12/14   Ricky Fines, MD  Cholecalciferol (VITAMIN D3) 2000 UNITS TABS Take 2,000 Units by mouth daily. 07/31/14   Funches, Josalyn, MD  clopidogrel  (PLAVIX ) 75 MG tablet Take 75 mg by mouth in the morning.    [provider]  dapagliflozin  propanediol (FARXIGA ) 10 MG TABS tablet Take 10 mg by mouth in the morning.    [provider]  Evolocumab  (REPATHA  SURECLICK) 140 MG/ML SOAJ Inject 140 mg into the skin every 14 (fourteen) days. 01/12/23   Sabharwal, Aditya, DO  furosemide  (LASIX ) 20 MG tablet Take 1 tablet (20 mg total) by mouth as needed for fluid or edema. 01/07/23   Dunn, Dayna N, PA-C  hydrALAZINE  (APRESOLINE ) 25 MG tablet Take 1 tablet (25 mg total) by mouth 3 (three) times daily. Patient taking differently: Take 25 mg by mouth daily. 03/03/23   Sabharwal, Aditya, DO  hydrALAZINE  (APRESOLINE ) 50 MG tablet Take 50 mg by mouth 2 (two) times daily.    [provider]  isosorbide  mononitrate (IMDUR ) 30 MG 24 hr tablet Take 30 mg by mouth daily.    [provider]  metFORMIN (GLUCOPHAGE) 500 MG tablet Take 500 mg by mouth 2 (two) times daily with a  meal. 09/08/22   [provider]  Multiple Vitamin (MULTIVITAMIN WITH MINERALS) TABS tablet Take 1 tablet by mouth in the morning. Centrum    [provider]  nitroGLYCERIN  (NITROSTAT ) 0.3 MG SL tablet Place 1 tablet (0.3 mg total) under the tongue every 5 (five) minutes as needed for chest pain (Only take medication if you have chest pain. Please go to an emergency department if you have chest pain). 09/26/22 09/26/23  Kandis Perkins, DO  sacubitril -valsartan  (ENTRESTO ) 97-103 MG Take 1 tablet by mouth 2 (two) times daily.    [provider]  spironolactone  (ALDACTONE ) 25 MG tablet Take 25 mg by mouth in the morning.    [provider]    Family History Family History  Problem Relation Age of Onset   Dementia Mother    Dementia Father     Social History Social History   Tobacco Use   Smoking status: Light Smoker    Current packs/day: 0.25    Types: Cigarettes   Smokeless tobacco: Never  Substance Use Topics   Alcohol use: No    Alcohol/week: 0.0 standard drinks of alcohol   Drug use: Yes    Types: Marijuana     Allergies   Patient has no known allergies.   Review of Systems Review of Systems See HPI  Physical Exam Triage Vital Signs ED Triage Vitals  Encounter Vitals Group     BP 04/29/23 1601 125/82     Systolic BP Percentile --      Diastolic BP Percentile --      Pulse Rate 04/29/23 1601 86     Resp 04/29/23 1601 16     Temp 04/29/23 1601 98.7 F (37.1 C)     Temp src --      SpO2 04/29/23 1601 98 %     Weight --      Height --      Head Circumference --      Peak Flow --      Pain Score 04/29/23 1600 6     Pain Loc --      Pain Education --      Exclude from Growth Chart --    No data found.  Updated Vital Signs BP 125/82   Pulse 86   Temp 98.7 F (37.1 C)   Resp 16   SpO2 98%      Physical Exam Constitutional:      General: He is not in acute distress.    Appearance: He is well-developed. He is  ill-appearing.  HENT:     Head: Normocephalic and atraumatic.  Eyes:     Conjunctiva/sclera: Conjunctivae normal.  Pupils: Pupils are equal, round, and reactive to light.  Cardiovascular:     Rate and Rhythm: Normal rate.  Pulmonary:     Effort: Pulmonary effort is normal. No respiratory distress.  Abdominal:     General: There is no distension.     Palpations: Abdomen is soft.  Musculoskeletal:        General: Normal range of motion.     Cervical back: Normal range of motion.  Skin:    General: Skin is warm and dry.  Neurological:     Mental Status: He is alert.      UC Treatments / Results  Labs (all labs ordered are listed, but only abnormal results are displayed) Labs Reviewed  POC SARS CORONAVIRUS 2 AG -  ED - Abnormal; Notable for the following components:      Result Value   SARS Coronavirus 2 Ag Positive (*)    All other components within normal limits  POCT INFLUENZA A/B - Normal    EKG   Radiology No results found.  Procedures Procedures (including critical care time)  Medications Ordered in UC Medications - No data to display  Initial Impression / Assessment and Plan / UC Course  I have reviewed the triage vital signs and the nursing notes.  Pertinent labs & imaging results that were available during my care of the patient were reviewed by me and considered in my medical decision making (see chart for details).     Given patient's multiple comorbidities I feel like he is at risk for complications from COVID.  I reviewed his medication list and interactions with Paxlovid .  His most recent GFR is 66.  He is advised to continue to drink lots of water.  According to up-to-date recommendations he should reduce his Eliquis  by half and hold his statin while on the Paxlovid .  This is discussed with him and his daughter.  They should call his personal physician if there is any questions. Final Clinical Impressions(s) / UC Diagnoses   Final diagnoses:   COVID-19     Discharge Instructions      Take Paxlovid  2 times a day  Reduce the Eliquis  by 50% and take 1/2 a tab 2 x a day  While you are on Paxlovid  do not take a atorvastatin .  Stop this medicine for 5 days  Drink lots of fluids  Call your doctors regarding the surgery schedule      ED Prescriptions     Medication Sig Dispense Auth. Provider   nirmatrelvir/ritonavir (PAXLOVID , 300/100,) 20 x 150 MG & 10 x 100MG  TBPK Take 3 tablets by mouth 2 (two) times daily for 5 days. Patient GFR is 66. Take nirmatrelvir (150 mg) two tablets twice daily for 5 days and ritonavir (100 mg) one tablet twice daily for 5 days. 30 tablet Maranda Jamee Jacob, MD      PDMP not reviewed this encounter.   Maranda Jamee Jacob, MD 04/29/23 [phone]

## 2023-04-29 NOTE — ED Triage Notes (Signed)
 [facility] presents to [facility] with co of body aches, cough, congestion, headaches for 48 hr. Pt reports he has been taking otc cold and flu medications.

## 2023-04-30 NOTE — Progress Notes (Addendum)
 COVID Vaccine Completed:  Date of COVID positive in last 90 days: 04/29/23  PCP - Nita Bast, NP Cardiologist - Antoinette Batman, MD Electrophysiologist- Marlane Silver, MD  Cardiac clearance by Slater Duncan, NP 03/26/23 in Epic   Chest x-ray - 12/31/22 Epic EKG - 04/05/23 Epic Stress Test -  ECHO - 03/03/23 Epic Cardiac Cath - 07/13/22 Epic Pacemaker/ICD device last checked: 04/05/23 Epic, Orders in chart Spinal Cord Stimulator:  Bowel Prep -   Sleep Study -  CPAP -   Fasting Blood Sugar -  Checks Blood Sugar _____ times a day  Last dose of GLP1 agonist-  N/A GLP1 instructions:  Hold 7 days before surgery    Last dose of SGLT-2 inhibitors-  N/A SGLT-2 instructions:  Hold 3 days before surgery    Blood Thinner Instructions:  Eliquis  hold 3 days and Plavix  Aspirin  Instructions: Last Dose: Eliquis  05/01/23  Activity level:  Can go up a flight of stairs and perform activities of daily living without stopping and without symptoms of chest pain or shortness of breath.  Able to exercise without symptoms  Unable to go up a flight of stairs without symptoms of     Anesthesia review: HTN, CAD, THN, CHF, LBBB, stroke  Patient denies shortness of breath, fever, cough and chest pain at PAT appointment  Patient verbalized understanding of instructions that were given to them at the PAT appointment. Patient was also instructed that they will need to review over the PAT instructions again at home before surgery.

## 2023-05-03 ENCOUNTER — Encounter (HOSPITAL_COMMUNITY)
Admission: RE | Admit: 2023-05-03 | Discharge: 2023-05-03 | Disposition: A | Discharge status: Home / Self Care | Payer: 59 | Source: Ambulatory Visit | Attending: Anesthesiology | Admitting: Anesthesiology

## 2023-05-03 DIAGNOSIS — I10 Essential (primary) hypertension: Secondary | ICD-10-CM

## 2023-05-03 NOTE — Patient Instructions (Signed)
 SURGICAL WAITING ROOM VISITATION  Patients having surgery or a procedure may have no more than 2 support people in the waiting area - these visitors may rotate.    Children under the age of 80 must have an adult with them who is not the patient.  Due to an increase in RSV and influenza rates and associated hospitalizations, children ages 49 and under may not visit patients in [facility] hospitals.  Visitors with respiratory illnesses are discouraged from visiting and should remain at home.  If the patient needs to stay at the hospital during part of their recovery, the visitor guidelines for inpatient rooms apply. Pre-op  nurse will coordinate an appropriate time for 1 support person to accompany patient in pre-op .  This support person may not rotate.    Please refer to the [facility] website for the visitor guidelines for Inpatients (after your surgery is over and you are in a regular room).    Your procedure is scheduled on: 05/05/23   Report to [facility] Main Entrance    Report to admitting at 6:15 AM   Call this number if you have problems the morning of surgery [phone]   Do not eat food or drink liquids:After Midnight.          If you have questions, please contact your surgeon's office.   FOLLOW BOWEL PREP AND ANY ADDITIONAL PRE OP INSTRUCTIONS YOU RECEIVED FROM YOUR SURGEON'S OFFICE!!!     Oral Hygiene is also important to reduce your risk of infection.                                    Remember - BRUSH YOUR TEETH THE MORNING OF SURGERY WITH YOUR REGULAR TOOTHPASTE  DENTURES WILL BE REMOVED PRIOR TO SURGERY PLEASE DO NOT APPLY "Poly grip" OR ADHESIVES!!!   Do NOT smoke after Midnight   Stop all vitamins and herbal supplements 7 days before surgery.   Take these medicines the morning of surgery with A SIP OF WATER: Atorvastatin , Carvedilol , Hydralazine , Isosorbide    DO NOT TAKE ANY ORAL DIABETIC MEDICATIONS DAY OF YOUR SURGERY  Bring CPAP mask and  tubing day of surgery.                              You may not have any metal on your body including jewelry, and body piercing             Do not wear lotions, powders, cologne, or deodorant              Men may shave face and neck.   Do not bring valuables to the hospital. [facility] IS NOT             RESPONSIBLE   FOR VALUABLES.   Contacts, glasses, dentures or bridgework may not be worn into surgery.  DO NOT BRING YOUR HOME MEDICATIONS TO THE HOSPITAL. PHARMACY WILL DISPENSE MEDICATIONS LISTED ON YOUR MEDICATION LIST TO YOU DURING YOUR ADMISSION IN THE HOSPITAL!    Patients discharged on the day of surgery will not be allowed to drive home.  Someone NEEDS to stay with you for the first 24 hours after anesthesia.   Special Instructions: Bring a copy of your healthcare power of attorney and living will documents the day of surgery if you haven't scanned them before.  Please read over the following fact sheets you were given: IF YOU HAVE QUESTIONS ABOUT YOUR PRE-OP  INSTRUCTIONS PLEASE CALL [phone]- Kayleen Party    If you received a COVID test during your pre-op  visit  it is requested that you wear a mask when out in public, stay away from anyone that may not be feeling well and notify your surgeon if you develop symptoms. If you test positive for Covid or have been in contact with anyone that has tested positive in the last 10 days please notify you surgeon.    [facility] - Preparing for Surgery Before surgery, you can play an important role.  Because skin is not sterile, your skin needs to be as free of germs as possible.  You can reduce the number of germs on your skin by washing with CHG (chlorahexidine gluconate) soap before surgery.  CHG is an antiseptic cleaner which kills germs and bonds with the skin to continue killing germs even after washing. Please DO NOT use if you have an allergy to CHG or antibacterial soaps.  If your skin becomes reddened/irritated stop  using the CHG and inform your nurse when you arrive at Short Stay. Do not shave (including legs and underarms) for at least 48 hours prior to the first CHG shower.  You may shave your face/neck.  Please follow these instructions carefully:  1.  Shower with CHG Soap the night before surgery and the  morning of surgery.  2.  If you choose to wash your hair, wash your hair first as usual with your normal  shampoo.  3.  After you shampoo, rinse your hair and body thoroughly to remove the shampoo.                             4.  Use CHG as you would any other liquid soap.  You can apply chg directly to the skin and wash.  Gently with a scrungie or clean washcloth.  5.  Apply the CHG Soap to your body ONLY FROM THE NECK DOWN.   Do   not use on face/ open                           Wound or open sores. Avoid contact with eyes, ears mouth and   genitals (private parts).                       Wash face,  Genitals (private parts) with your normal soap.             6.  Wash thoroughly, paying special attention to the area where your    surgery  will be performed.  7.  Thoroughly rinse your body with warm water from the neck down.  8.  DO NOT shower/wash with your normal soap after using and rinsing off the CHG Soap.                9.  Pat yourself dry with a clean towel.            10.  Wear clean pajamas.            11.  Place clean sheets on your bed the night of your first shower and do not  sleep with pets. Day of Surgery : Do not apply any lotions/deodorants the morning of surgery.  Please wear clean clothes to the hospital/surgery  center.  FAILURE TO FOLLOW THESE INSTRUCTIONS MAY RESULT IN THE CANCELLATION OF YOUR SURGERY  PATIENT SIGNATURE_________________________________  NURSE SIGNATURE__________________________________  ________________________________________________________________________

## 2023-05-05 ENCOUNTER — Ambulatory Visit (HOSPITAL_COMMUNITY): Admission: RE | Admit: 2023-05-05 | Payer: 59 | Source: Home / Self Care | Admitting: Urology

## 2023-05-05 ENCOUNTER — Encounter (HOSPITAL_COMMUNITY): Admission: RE | Payer: Self-pay | Source: Home / Self Care

## 2023-05-05 SURGERY — CIRCUMCISION ADULT
Anesthesia: General

## 2023-05-11 NOTE — Progress Notes (Signed)
 Remote ICD transmission.

## 2023-05-11 NOTE — Addendum Note (Signed)
 Addended by: Elease Etienne A on: 05/11/2023 03:19 PM   Modules accepted: Orders

## 2023-05-14 ENCOUNTER — Ambulatory Visit (HOSPITAL_COMMUNITY): Payer: 59 | Attending: Cardiology

## 2023-05-14 ENCOUNTER — Encounter: Payer: Self-pay | Admitting: Cardiovascular Disease

## 2023-05-14 DIAGNOSIS — I5022 Chronic systolic (congestive) heart failure: Secondary | ICD-10-CM | POA: Diagnosis present

## 2023-05-14 DIAGNOSIS — Z95810 Presence of automatic (implantable) cardiac defibrillator: Secondary | ICD-10-CM | POA: Insufficient documentation

## 2023-05-15 LAB — ECHOCARDIOGRAM COMPLETE
Area-P 1/2: 3.56 cm2
S' Lateral: 3.15 cm

## 2023-06-01 ENCOUNTER — Ambulatory Visit (HOSPITAL_COMMUNITY)
Admission: RE | Admit: 2023-06-01 | Discharge: 2023-06-01 | Disposition: A | Discharge status: Home / Self Care | Payer: 59 | Source: Ambulatory Visit | Attending: Cardiology | Admitting: Cardiology

## 2023-06-01 VITALS — BP 130/90 | HR 77 | Wt 200.0 lb

## 2023-06-01 DIAGNOSIS — N183 Chronic kidney disease, stage 3 unspecified: Secondary | ICD-10-CM | POA: Diagnosis present

## 2023-06-01 DIAGNOSIS — I447 Left bundle-branch block, unspecified: Secondary | ICD-10-CM | POA: Insufficient documentation

## 2023-06-01 DIAGNOSIS — I5022 Chronic systolic (congestive) heart failure: Secondary | ICD-10-CM | POA: Insufficient documentation

## 2023-06-01 DIAGNOSIS — E1140 Type 2 diabetes mellitus with diabetic neuropathy, unspecified: Secondary | ICD-10-CM | POA: Insufficient documentation

## 2023-06-01 DIAGNOSIS — I428 Other cardiomyopathies: Secondary | ICD-10-CM | POA: Insufficient documentation

## 2023-06-01 DIAGNOSIS — E1122 Type 2 diabetes mellitus with diabetic chronic kidney disease: Secondary | ICD-10-CM | POA: Diagnosis not present

## 2023-06-01 DIAGNOSIS — N1831 Chronic kidney disease, stage 3a: Secondary | ICD-10-CM

## 2023-06-01 DIAGNOSIS — E118 Type 2 diabetes mellitus with unspecified complications: Secondary | ICD-10-CM

## 2023-06-01 DIAGNOSIS — Z79899 Other long term (current) drug therapy: Secondary | ICD-10-CM | POA: Insufficient documentation

## 2023-06-01 DIAGNOSIS — Z8673 Personal history of transient ischemic attack (TIA), and cerebral infarction without residual deficits: Secondary | ICD-10-CM | POA: Insufficient documentation

## 2023-06-01 DIAGNOSIS — Z7984 Long term (current) use of oral hypoglycemic drugs: Secondary | ICD-10-CM | POA: Insufficient documentation

## 2023-06-01 DIAGNOSIS — Z7901 Long term (current) use of anticoagulants: Secondary | ICD-10-CM | POA: Insufficient documentation

## 2023-06-01 DIAGNOSIS — I2510 Atherosclerotic heart disease of native coronary artery without angina pectoris: Secondary | ICD-10-CM | POA: Insufficient documentation

## 2023-06-01 DIAGNOSIS — Z950 Presence of cardiac pacemaker: Secondary | ICD-10-CM | POA: Diagnosis not present

## 2023-06-01 DIAGNOSIS — Z7902 Long term (current) use of antithrombotics/antiplatelets: Secondary | ICD-10-CM | POA: Diagnosis not present

## 2023-06-01 DIAGNOSIS — E785 Hyperlipidemia, unspecified: Secondary | ICD-10-CM | POA: Diagnosis not present

## 2023-06-01 LAB — BASIC METABOLIC PANEL
Anion gap: 9 (ref 5–15)
BUN: 29 mg/dL — ABNORMAL HIGH (ref 8–23)
CO2: 22 mmol/L (ref 22–32)
Calcium: 9.7 mg/dL (ref 8.9–10.3)
Chloride: 105 mmol/L (ref 98–111)
Creatinine, Ser: 1.67 mg/dL — ABNORMAL HIGH (ref 0.61–1.24)
GFR, Estimated: 45 mL/min — ABNORMAL LOW (ref >60)
Glucose, Bld: 116 mg/dL — ABNORMAL HIGH (ref 70–99)
Potassium: 4.9 mmol/L (ref 3.5–5.1)
Sodium: 136 mmol/L (ref 135–145)

## 2023-06-01 LAB — BRAIN NATRIURETIC PEPTIDE: B Natriuretic Peptide: 32.9 pg/mL (ref 0.0–100.0)

## 2023-06-01 MED ORDER — CARVEDILOL 25 MG PO TABS: 25.0000 mg | ORAL_TABLET | Freq: Two times a day (BID) | ORAL | 3 refills | Status: DC

## 2023-06-01 NOTE — Progress Notes (Addendum)
 ADVANCED HEART FAILURE CLINIC NOTE  Referring Physician: Filomena Jungling, NP  Primary Care: Filomena Jungling, NP Primary Cardiologist:  HPI: Steven Brady is a 68 y.o. male with HFrEF, CAD, cardioembolic stroke in 2023, CKD 3, left bundle branch block, CAD status post PCI in 2024 presenting today to establish care.  He was previously followed at [facility] for chronic HFrEF for at least 10 years and stroke in 2022 with EF as low as 10%.  Earlier this year he had a workup for CAD at [facility] where coronary CT showed multivessel CAD and cardiac MRI demonstrating scar in the inferior wall.  He ultimately underwent left heart cath in April 2024 and is now status post PCI to the RCA due to positive FFR.  He presented to [facility] in July 2024 for chest pain.  Echocardiogram at this time showed an EF of 25 to 30% with moderate LVH.  He was started on GDMT and discharged home.  Since that time he has also been seen by electrophysiology with plan for BiV upgrade.  Interval hx:  - Now s/p BiV device upgrade; reports from a HFrEF standpoint he feels very well. He ha sno lightheadedness, shortness of breath or LE edema.  - Only complaints today are diffuse arthralgias and neuropathy. He reports having numbness in the fingers and arthritis pain throughout the day.   Activity level/exercise tolerance:  NYHA IIB-III Orthopnea:  Sleeps on 2 pillows Paroxysmal noctural dyspnea:  No Chest pain/pressure:  no Orthostatic lightheadedness:  no Palpitations:  no Lower extremity edema:  no Presyncope/syncope:  no Cough:  no  Current Outpatient Medications  Medication Sig Dispense Refill   apixaban (ELIQUIS) 5 MG TABS tablet Take 5 mg by mouth 2 (two) times daily.     atorvastatin (LIPITOR) 80 MG tablet TAKE 1 TABLET BY MOUTH EVERY DAY 30 tablet 0   carvedilol (COREG) 12.5 MG tablet Take 1 tablet (12.5 mg total) by mouth 2 (two) times daily. 60 tablet 1   Cholecalciferol (VITAMIN D3) 2000 UNITS  TABS Take 2,000 Units by mouth daily. 30 tablet 11   clopidogrel (PLAVIX) 75 MG tablet Take 75 mg by mouth in the morning.     dapagliflozin propanediol (FARXIGA) 10 MG TABS tablet Take 10 mg by mouth in the morning.     Evolocumab (REPATHA SURECLICK) 140 MG/ML SOAJ Inject 140 mg into the skin every 14 (fourteen) days. 6 mL 3   furosemide (LASIX) 20 MG tablet Take 1 tablet (20 mg total) by mouth as needed for fluid or edema. 30 tablet 2   hydrALAZINE (APRESOLINE) 25 MG tablet Take 1 tablet (25 mg total) by mouth 3 (three) times daily. (Patient taking differently: Take 25 mg by mouth daily.) 90 tablet 11   hydrALAZINE (APRESOLINE) 50 MG tablet Take 50 mg by mouth 2 (two) times daily.     isosorbide mononitrate (IMDUR) 30 MG 24 hr tablet Take 30 mg by mouth daily.     metFORMIN (GLUCOPHAGE) 500 MG tablet Take 500 mg by mouth 2 (two) times daily with a meal.     Multiple Vitamin (MULTIVITAMIN WITH MINERALS) TABS tablet Take 1 tablet by mouth in the morning. Centrum     nitroGLYCERIN (NITROSTAT) 0.3 MG SL tablet Place 1 tablet (0.3 mg total) under the tongue every 5 (five) minutes as needed for chest pain (Only take medication if you have chest pain. Please go to an emergency department if you have chest pain). 10 tablet 0   sacubitril-valsartan (  ENTRESTO) 97-103 MG Take 1 tablet by mouth 2 (two) times daily.     spironolactone (ALDACTONE) 25 MG tablet Take 25 mg by mouth in the morning.     No current facility-administered medications for this encounter.    No Known Allergies    PHYSICAL EXAM: Vitals:   06/01/23 1456  BP: (!) 130/90  Pulse: 77  SpO2: 96%   GENERAL: NAD Lungs- CTA B/L CARDIAC:  JVP: 6 cm          Normal rate with regular rhythm. no murmur.  Pulses 2+. No edema.  ABDOMEN: Soft, non-tender, non-distended.  EXTREMITIES: Warm and well perfused.  NEUROLOGIC: No obvious FND   DATA REVIEW  ECG: 09/25/22: NSR w/ LBBB  As per my personal interpretation  ECHO: 09/26/22: LVEF  25-30% w/ prominent IVS dyssynchrony due to LBBB as per my personal interpretation 03/03/23: LVEF 30%, normal RV function personally reviewed and interpreted 05/14/23: LVEF 35%-40%, normal RV function, personally reviewed.   CATH: 07/13/22: Left Anterior Descending: The vessel exhibits minimal luminal irregularities.  Left Circumflex: The vessel exhibits minimal luminal irregularities. Dist Cx lesion is 70% stenosed. Third Obtuse Marginal Branch: 3rd Mrg lesion is 35% stenosed.  Right Coronary Artery: Ost RCA to Prox RCA lesion is 30% stenosed. Prox RCA lesion is 70% stenosed. Lesion length: 13 mm. TIMI flow is 3. The lesion is not complex (non high-C). Mid RCA lesion is 70% stenosed. Lesion length: 15 mm. TIMI flow is 3. The lesion is not complex (non high-C). Dist RCA lesion is 70% stenosed. Lesion length: 20 mm. TIMI flow is 3. The lesion is not complex (non high-C). Pressure wire/FFR was performed on the lesion. FFR: 0.79. Right Posterior Descending Artery: RPDA lesion is 30% stenosed.  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  Status post PCI x 3 to the RCA.  Diffuse reduction in LVEF however is consistent with a nonischemic process. NYHA class / AHA Stage:IIB Volume status & Diuretics:  Euvolemic to hypovolemic on exam; IVC 1.5cm; continue lasix 20mg  PRN daily despite rise in impedance monitoring on device.  Vasodilators: Entresto 97/93 mg twice daily, decrease hydralazine to 25mg  TID due to lightheadedness, continue imdur 30mg  daily. Repeat BMP/BNP today Beta-Blocker: Increase coreg 25mg  BID  MRA: Spironolactone 25 mg daily Cardiometabolic: Farxiga 10 mg daily; reports he ran out today. Will refill .  Devices therapies & Valvulopathies: BIV device placed on 12/31/22. Device interrogation today with >99% BIV pacing; personally reviewed and interpreted. Advanced  therapies: Will continue follow-up.  Currently not a candidate.  2.  Coronary artery disease -Status post PCI to the RCA x 3.  Left heart cath noted above. - no chest pain  3.  History of cardioembolic stroke -Currently on Eliquis and Plavix. -Will plan on D/C apixaban in the next few months if LV function improves.  4. Hyperlipidemia - LDL remains above goal - Now on repatha. Repeat lipid panel.   5. T2DM - Currently taking metformin 500mg  BID - A1C 7.1 on 04/01/23  6. Peripheral neuropathy - A1C 7.1; suspect it is diabetic in nature. No signs of amyloid on TTE.     Nzinga Ferran Advanced Heart Failure Mechanical Circulatory Support

## 2023-06-01 NOTE — Patient Instructions (Addendum)
 Labs done today. We will contact you only if your labs are abnormal.  INCREASE Carvedilol to 25mg  (1 tablet) by mouth 2 times daily.  No other medication changes were made. Please continue all current medications as prescribed.  Your physician recommends that you schedule a follow-up appointment in: 3 months with an echo prior to your appointment  Your physician has requested that you have an echocardiogram. Echocardiography is a painless test that uses sound waves to create images of your heart. It provides your doctor with information about the size and shape of your heart and how well your heart's chambers and valves are working. This procedure takes approximately one hour. There are no restrictions for this procedure. Please do NOT wear cologne, perfume, aftershave, or lotions (deodorant is allowed). Please arrive 15 minutes prior to your appointment time.  Please note: We ask at that you not bring children with you during ultrasound (echo/ vascular) testing. Due to room size and safety concerns, children are not allowed in the ultrasound rooms during exams. Our front office staff cannot provide observation of children in our lobby area while testing is being conducted. An adult accompanying a patient to their appointment will only be allowed in the ultrasound room at the discretion of the ultrasound technician under special circumstances. We apologize for any inconvenience.  If you have any questions or concerns before your next appointment please send [place] a message through [place] or call our office at [phone].    TO LEAVE A MESSAGE FOR THE NURSE SELECT OPTION 2, PLEASE LEAVE A MESSAGE INCLUDING: YOUR NAME DATE OF BIRTH CALL BACK NUMBER REASON FOR CALL**this is important as we prioritize the call backs  YOU WILL RECEIVE A CALL BACK THE SAME DAY AS LONG AS YOU CALL BEFORE 4:00 PM   Do the following things EVERYDAY: Weigh yourself in the morning before breakfast. Write it down and  keep it in a log. Take your medicines as prescribed Eat low salt foods--Limit salt (sodium) to 2000 mg per day.  Stay as active as you can everyday Limit all fluids for the day to less than 2 liters   At the [facility], you and your health needs are our priority. As part of our continuing mission to provide you with exceptional heart care, we have created designated Provider Care Teams. These Care Teams include your primary Cardiologist (physician) and Advanced Practice Providers (APPs- Physician Assistants and Nurse Practitioners) who all work together to provide you with the care you need, when you need it.   You may see any of the following providers on your designated Care Team at your next follow up: Dr Arvilla Meres Dr Marca Ancona Dr. Marcos Eke, NP Robbie Lis, [place] [facility] [place], [place] Brynda Peon, NP Karle Plumber, PharmD   Please be sure to bring in all your medications bottles to every appointment.    Thank you for choosing [facility]

## 2023-06-02 LAB — ANA: Anti Nuclear Antibody (ANA): NEGATIVE

## 2023-06-03 LAB — RHEUMATOID FACTOR: Rheumatoid fact SerPl-aCnc: 11.4 [IU]/mL (ref <14.0)

## 2023-06-04 NOTE — Addendum Note (Signed)
 Encounter addended by: Howell Rucks, RDCS on: 06/04/2023 1:08 PM  Actions taken: Imaging Exam ended

## 2023-06-22 ENCOUNTER — Encounter (HOSPITAL_COMMUNITY): Payer: Self-pay

## 2023-06-28 ENCOUNTER — Other Ambulatory Visit: Payer: Self-pay | Admitting: Physician Assistant

## 2023-06-29 ENCOUNTER — Telehealth (HOSPITAL_COMMUNITY): Payer: Self-pay

## 2023-06-29 NOTE — Telephone Encounter (Signed)
 Called to confirm/remind patient of their appointment at the [facility] on 06/30/2023 1:30.   Appointment:   [] Confirmed  [x] Left mess   [] No answer/No voice mail  [] Phone not in service  Patient reminded to bring all medications and/or complete list.  Confirmed patient has transportation. Gave directions, instructed to utilize valet parking.

## 2023-06-30 ENCOUNTER — Encounter (HOSPITAL_COMMUNITY): Payer: Self-pay

## 2023-06-30 ENCOUNTER — Ambulatory Visit (HOSPITAL_COMMUNITY)
Admission: RE | Admit: 2023-06-30 | Discharge: 2023-06-30 | Disposition: A | Discharge status: Home / Self Care | Source: Ambulatory Visit | Attending: Physician Assistant | Admitting: Physician Assistant

## 2023-06-30 ENCOUNTER — Other Ambulatory Visit (HOSPITAL_COMMUNITY): Payer: Self-pay | Admitting: Physician Assistant

## 2023-06-30 VITALS — BP 118/70 | HR 73 | Wt 201.6 lb

## 2023-06-30 DIAGNOSIS — Z7901 Long term (current) use of anticoagulants: Secondary | ICD-10-CM | POA: Insufficient documentation

## 2023-06-30 DIAGNOSIS — E1142 Type 2 diabetes mellitus with diabetic polyneuropathy: Secondary | ICD-10-CM | POA: Insufficient documentation

## 2023-06-30 DIAGNOSIS — R600 Localized edema: Secondary | ICD-10-CM | POA: Diagnosis not present

## 2023-06-30 DIAGNOSIS — Z4502 Encounter for adjustment and management of automatic implantable cardiac defibrillator: Secondary | ICD-10-CM | POA: Insufficient documentation

## 2023-06-30 DIAGNOSIS — Z8673 Personal history of transient ischemic attack (TIA), and cerebral infarction without residual deficits: Secondary | ICD-10-CM | POA: Diagnosis not present

## 2023-06-30 DIAGNOSIS — I5022 Chronic systolic (congestive) heart failure: Secondary | ICD-10-CM

## 2023-06-30 DIAGNOSIS — E785 Hyperlipidemia, unspecified: Secondary | ICD-10-CM | POA: Insufficient documentation

## 2023-06-30 DIAGNOSIS — Z79899 Other long term (current) drug therapy: Secondary | ICD-10-CM | POA: Diagnosis not present

## 2023-06-30 DIAGNOSIS — E1122 Type 2 diabetes mellitus with diabetic chronic kidney disease: Secondary | ICD-10-CM | POA: Insufficient documentation

## 2023-06-30 DIAGNOSIS — I447 Left bundle-branch block, unspecified: Secondary | ICD-10-CM | POA: Insufficient documentation

## 2023-06-30 DIAGNOSIS — I639 Cerebral infarction, unspecified: Secondary | ICD-10-CM | POA: Diagnosis not present

## 2023-06-30 DIAGNOSIS — Z7984 Long term (current) use of oral hypoglycemic drugs: Secondary | ICD-10-CM | POA: Insufficient documentation

## 2023-06-30 DIAGNOSIS — E782 Mixed hyperlipidemia: Secondary | ICD-10-CM

## 2023-06-30 DIAGNOSIS — I255 Ischemic cardiomyopathy: Secondary | ICD-10-CM | POA: Diagnosis not present

## 2023-06-30 DIAGNOSIS — I959 Hypotension, unspecified: Secondary | ICD-10-CM | POA: Insufficient documentation

## 2023-06-30 DIAGNOSIS — I428 Other cardiomyopathies: Secondary | ICD-10-CM | POA: Insufficient documentation

## 2023-06-30 DIAGNOSIS — Z7902 Long term (current) use of antithrombotics/antiplatelets: Secondary | ICD-10-CM | POA: Diagnosis not present

## 2023-06-30 DIAGNOSIS — I2510 Atherosclerotic heart disease of native coronary artery without angina pectoris: Secondary | ICD-10-CM | POA: Diagnosis not present

## 2023-06-30 DIAGNOSIS — Z955 Presence of coronary angioplasty implant and graft: Secondary | ICD-10-CM | POA: Insufficient documentation

## 2023-06-30 DIAGNOSIS — R2681 Unsteadiness on feet: Secondary | ICD-10-CM | POA: Insufficient documentation

## 2023-06-30 DIAGNOSIS — N183 Chronic kidney disease, stage 3 unspecified: Secondary | ICD-10-CM | POA: Insufficient documentation

## 2023-06-30 LAB — COMPREHENSIVE METABOLIC PANEL WITH GFR
ALT: 31 U/L (ref 0–44)
AST: 19 U/L (ref 15–41)
Albumin: 3.7 g/dL (ref 3.5–5.0)
Alkaline Phosphatase: 45 U/L (ref 38–126)
Anion gap: 10 (ref 5–15)
BUN: 21 mg/dL (ref 8–23)
CO2: 23 mmol/L (ref 22–32)
Calcium: 9.8 mg/dL (ref 8.9–10.3)
Chloride: 107 mmol/L (ref 98–111)
Creatinine, Ser: 1.44 mg/dL — ABNORMAL HIGH (ref 0.61–1.24)
GFR, Estimated: 54 mL/min — ABNORMAL LOW (ref >60)
Glucose, Bld: 98 mg/dL (ref 70–99)
Potassium: 4.7 mmol/L (ref 3.5–5.1)
Sodium: 140 mmol/L (ref 135–145)
Total Bilirubin: 1 mg/dL (ref 0.0–1.2)
Total Protein: 7 g/dL (ref 6.5–8.1)

## 2023-06-30 LAB — LIPID PANEL
Cholesterol: 176 mg/dL (ref 0–200)
HDL: 56 mg/dL (ref >40)
LDL Cholesterol: 92 mg/dL (ref 0–99)
Total CHOL/HDL Ratio: 3.1 ratio
Triglycerides: 142 mg/dL (ref <150)
VLDL: 28 mg/dL (ref 0–40)

## 2023-06-30 LAB — BRAIN NATRIURETIC PEPTIDE: B Natriuretic Peptide: 71.7 pg/mL (ref 0.0–100.0)

## 2023-06-30 MED ORDER — DAPAGLIFLOZIN PROPANEDIOL 10 MG PO TABS: 10.0000 mg | ORAL_TABLET | Freq: Every morning | ORAL | 5 refills | Status: DC

## 2023-06-30 MED ORDER — CLOPIDOGREL BISULFATE 75 MG PO TABS: 75.0000 mg | ORAL_TABLET | Freq: Every morning | ORAL | 5 refills | Status: DC

## 2023-06-30 NOTE — Progress Notes (Signed)
 ADVANCED HEART FAILURE CLINIC NOTE  Referring Physician: Filomena Jungling, NP  Primary Care: Filomena Jungling, NP Primary Cardiologist:  HPI: Steven Brady is a 68 y.o. male with HFrEF, CAD, cardioembolic stroke in 2023, CKD 3, left bundle branch block, CAD status post PCI in 2024.  He was previously followed at [facility] for chronic HFrEF for at least 10 years and stroke in 2022 with EF as low as 10%.  Earlier this year he had a workup for CAD at [facility] where coronary CT showed multivessel CAD and cardiac MRI showed patchy scarring involving LV in noncoronary pattern.  He ultimately underwent left heart cath in April 2024 and is now status post PCI/stents to the RCA due to positive FFR.    He presented to [facility] in July 2024 for chest pain.  Echocardiogram at this time showed an EF of 25 to 30% with moderate LVH.  He was started on GDMT and discharged home.    He later underwent BiV ICD in 10/24.  Echo 02/25: EF 35-40%, RV okay  Here today for urgent follow-up d/t hypotension and concern for volume overload. Had a low BP 66/47 at visit with PCP on 03/31. Also reported increased leg swelling. BP stable today and reports it has been mostly > 100 systolic (occasionally 90s). He has taken lasix the last 4 days with significant improvement in leg edema. Typically uses lasix 1-2 days a week. No orthopnea or PND. Denies dyspnea. Very compliant with fluid and sodium restrictions. Main complaint is chronic lightheadedness/unsteady gait since his stroke and neuropathy. Has been participating in vestibular rehab with some improvement.    Current Outpatient Medications  Medication Sig Dispense Refill   apixaban (ELIQUIS) 5 MG TABS tablet Take 5 mg by mouth 2 (two) times daily.     atorvastatin (LIPITOR) 80 MG tablet TAKE 1 TABLET BY MOUTH EVERY DAY 30 tablet 0   carvedilol (COREG) 25 MG tablet Take 1 tablet (25 mg total) by mouth 2 (two) times daily. 180 tablet 3   Cholecalciferol  (VITAMIN D3) 2000 UNITS TABS Take 2,000 Units by mouth daily. 30 tablet 11   Evolocumab (REPATHA SURECLICK) 140 MG/ML SOAJ Inject 140 mg into the skin every 14 (fourteen) days. 6 mL 3   furosemide (LASIX) 20 MG tablet TAKE 1 TABLET (20 MG TOTAL) BY MOUTH AS NEEDED FOR FLUID OR EDEMA. 90 tablet 0   hydrALAZINE (APRESOLINE) 25 MG tablet Take 1 tablet (25 mg total) by mouth 3 (three) times daily. 90 tablet 11   isosorbide mononitrate (IMDUR) 30 MG 24 hr tablet Take 30 mg by mouth daily.     metFORMIN (GLUCOPHAGE) 500 MG tablet Take 500 mg by mouth 2 (two) times daily with a meal.     Multiple Vitamin (MULTIVITAMIN WITH MINERALS) TABS tablet Take 1 tablet by mouth in the morning. Centrum     nitroGLYCERIN (NITROSTAT) 0.3 MG SL tablet Place 1 tablet (0.3 mg total) under the tongue every 5 (five) minutes as needed for chest pain (Only take medication if you have chest pain. Please go to an emergency department if you have chest pain). 10 tablet 0   sacubitril-valsartan (ENTRESTO) 97-103 MG Take 1 tablet by mouth 2 (two) times daily.     spironolactone (ALDACTONE) 25 MG tablet Take 25 mg by mouth in the morning.     clopidogrel (PLAVIX) 75 MG tablet Take 1 tablet (75 mg total) by mouth in the morning. 30 tablet 5   dapagliflozin propanediol (FARXIGA)  10 MG TABS tablet Take 1 tablet (10 mg total) by mouth in the morning. 30 tablet 5   No current facility-administered medications for this encounter.    No Known Allergies    PHYSICAL EXAM: Vitals:   06/30/23 1330  BP: 118/70  Pulse: 73  SpO2: 98%   Last Weight  Most recent update: 06/30/2023  1:33 PM    Weight  91.4 kg (201 lb 9.6 oz)             General:  Well appearing. No resp difficulty HEENT: normal Neck: supple. no JVD. Carotids 2+ bilat; no bruits. No lymphadenopathy or thryomegaly appreciated. Cor: PMI nondisplaced. Regular rate & rhythm. No rubs, gallops or murmurs. Lungs: clear Abdomen: soft, nontender, nondistended. No  hepatosplenomegaly. No bruits or masses. Good bowel sounds. Extremities: no cyanosis, clubbing, rash, edema Neuro: alert & orientedx3, cranial nerves grossly intact. moves all 4 extremities w/o difficulty. Affect pleasant    DATA REVIEW  ECG: 09/25/22: NSR w/ LBBB  As per my personal interpretation  ECHO: 09/26/22: LVEF 25-30% w/ prominent IVS dyssynchrony due to LBBB 03/03/23: LVEF 30%, normal RV function  05/14/23: LVEF 35%-40%, normal RV function  CATH: 07/13/22: Left Anterior Descending: The vessel exhibits minimal luminal irregularities.  Left Circumflex: The vessel exhibits minimal luminal irregularities. Dist Cx lesion is 70% stenosed. Third Obtuse Marginal Branch: 3rd Mrg lesion is 35% stenosed.  Right Coronary Artery: Ost RCA to Prox RCA lesion is 30% stenosed. Prox RCA lesion is 70% stenosed. Lesion length: 13 mm. TIMI flow is 3. The lesion is not complex (non high-C). Mid RCA lesion is 70% stenosed. Lesion length: 15 mm. TIMI flow is 3. The lesion is not complex (non high-C). Dist RCA lesion is 70% stenosed. Lesion length: 20 mm. TIMI flow is 3. The lesion is not complex (non high-C). Pressure wire/FFR was performed on the lesion. FFR: 0.79. Right Posterior Descending Artery: RPDA lesion is 30% stenosed.  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  Status post PCI x 3 to the RCA.  Diffuse reduction in LVEF however is consistent with a nonischemic process. NYHA class / AHA Stage: II Volume status & Diuretics:  Volume okay on exam. However, thoracic impedance below threshold but trending up over last few days. Volume seems to be improving with lasix. Continue 20 mg lasix daily for the next 2-3 days, then can resume PRN. He has a good understanding  of when he needs diuretic. Vasodilators: Entresto 97/93 mg twice daily, continue hydralazine 25mg  TID/imdur 30mg  daily.  Beta- Continue Coreg 25mg  BID  MRA: Spironolactone 25 mg daily Cardiometabolic: Farxiga 10 mg daily; refilled today Devices therapies & Valvulopathies: BIV device placed on 12/31/22. Device interrogation today, > 99% BIV paced Advanced therapies: Will continue follow-up.  Currently not a candidate. Repeat echo in 2 months  2.  Coronary artery disease -Status post PCI to the RCA x 3.  Left heart cath noted above. -Refilled plavix today -no chest pain  3.  History of cardioembolic stroke -Currently on Eliquis and Plavix. -Consider discontinuing Eliquis in next few months if LV function continues to improve.  4. Hyperlipidemia - LDL remains above goal - Now on repatha.  - Check lipid panel  5. T2DM - Currently taking metformin 500mg  BID +  Farxiga - A1C 7.1 on 03/25  6. Peripheral neuropathy - A1C 7.1; suspect it is diabetic in nature. No signs of amyloid on TTE.   Follow-up: 2 months with Dr. Gasper Lloyd with echo  Anna Genre, PA-C

## 2023-06-30 NOTE — Patient Instructions (Signed)
 Medication Changes:  No Changes In Medications at this time.   CAN TAKE LASIX (FUROSEMIDE) DAILY FOR THE NEXT 2-3 DAYS AND THEN RETURN TO DAILY AS NEEDED  Lab Work:  Labs done today, your results will be available in MyChart, we will contact you for abnormal readings  Follow-Up in: WITH AN ECHO IN JUNE  WITH DR. Gasper Lloyd AS SCHEDULED   At the [facility], you and your health needs are our priority. We have a designated team specialized in the treatment of Heart Failure. This Care Team includes your primary Heart Failure Specialized Cardiologist (physician), Advanced Practice Providers (APPs- Physician Assistants and Nurse Practitioners), and Pharmacist who all work together to provide you with the care you need, when you need it.   You may see any of the following providers on your designated Care Team at your next follow up:  Dr. Arvilla Meres Dr. Marca Ancona Dr. Dorthula Nettles Dr. Theresia Bough Tonye Becket, NP Robbie Lis, [place] [facility] [place], [place] Brynda Peon, NP [place] Lee, NP Karle Plumber, PharmD   Please be sure to bring in all your medications bottles to every appointment.   Need to Contact us:  If you have any questions or concerns before your next appointment please send [place] a message through [place] or call our office at [phone].    TO LEAVE A MESSAGE FOR THE NURSE SELECT OPTION 2, PLEASE LEAVE A MESSAGE INCLUDING: YOUR NAME DATE OF BIRTH CALL BACK NUMBER REASON FOR CALL**this is important as we prioritize the call backs  YOU WILL RECEIVE A CALL BACK THE SAME DAY AS LONG AS YOU CALL BEFORE 4:00 PM

## 2023-07-01 ENCOUNTER — Ambulatory Visit (INDEPENDENT_AMBULATORY_CARE_PROVIDER_SITE_OTHER): Payer: 59

## 2023-07-01 ENCOUNTER — Encounter (HOSPITAL_COMMUNITY)

## 2023-07-01 DIAGNOSIS — I255 Ischemic cardiomyopathy: Secondary | ICD-10-CM | POA: Diagnosis not present

## 2023-07-02 ENCOUNTER — Other Ambulatory Visit (HOSPITAL_COMMUNITY): Payer: Self-pay | Admitting: Physician Assistant

## 2023-07-02 LAB — CUP PACEART REMOTE DEVICE CHECK
Battery Remaining Longevity: 87 mo
Battery Remaining Percentage: 90 %
Battery Voltage: 2.98 V
Brady Statistic AP VP Percent: 2.7 %
Brady Statistic AP VS Percent: 1 %
Brady Statistic AS VP Percent: 97 %
Brady Statistic AS VS Percent: 1 %
Brady Statistic RA Percent Paced: 2.7 %
Date Time Interrogation Session: 20250409220037
HighPow Impedance: 71 Ohm
Implantable Lead Connection Status: 753985
Implantable Lead Connection Status: 753985
Implantable Lead Connection Status: 753985
Implantable Lead Implant Date: 20241010
Implantable Lead Implant Date: 20241010
Implantable Lead Implant Date: 20241010
Implantable Lead Location: 753858
Implantable Lead Location: 753859
Implantable Lead Location: 753860
Implantable Pulse Generator Implant Date: 20241010
Lead Channel Impedance Value: 360 Ohm
Lead Channel Impedance Value: 460 Ohm
Lead Channel Impedance Value: 960 Ohm
Lead Channel Pacing Threshold Amplitude: 0.625 V
Lead Channel Pacing Threshold Amplitude: 0.875 V
Lead Channel Pacing Threshold Amplitude: 1.375 V
Lead Channel Pacing Threshold Pulse Width: 0.5 ms
Lead Channel Pacing Threshold Pulse Width: 0.5 ms
Lead Channel Pacing Threshold Pulse Width: 0.5 ms
Lead Channel Sensing Intrinsic Amplitude: 12 mV
Lead Channel Sensing Intrinsic Amplitude: 3.4 mV
Lead Channel Setting Pacing Amplitude: 1.375
Lead Channel Setting Pacing Amplitude: 1.625
Lead Channel Setting Pacing Amplitude: 2.375
Lead Channel Setting Pacing Pulse Width: 0.5 ms
Lead Channel Setting Pacing Pulse Width: 0.5 ms
Lead Channel Setting Sensing Sensitivity: 0.5 mV
Pulse Gen Serial Number: 211015510
Zone Setting Status: 755011

## 2023-07-06 ENCOUNTER — Encounter: Payer: Self-pay | Admitting: Cardiovascular Disease

## 2023-07-06 ENCOUNTER — Telehealth (HOSPITAL_COMMUNITY): Payer: Self-pay

## 2023-07-06 ENCOUNTER — Other Ambulatory Visit (HOSPITAL_COMMUNITY): Payer: Self-pay

## 2023-07-06 NOTE — Telephone Encounter (Signed)
 Advanced Heart Failure Patient Advocate Encounter  Received notification that prior auth is required for Dapagliflozin. Test billing indicates that this plan prefers brand name Farxiga (DAW [id number]). Spoke with pharmacy to reprocess, confirmed $0 copay. No prior auth needed at this time.  Kennis Peacock, CPhT Rx Patient Advocate Phone: [phone]

## 2023-07-07 NOTE — Telephone Encounter (Signed)
 Error

## 2023-07-08 ENCOUNTER — Telehealth: Payer: Self-pay | Admitting: Cardiovascular Disease

## 2023-07-08 ENCOUNTER — Other Ambulatory Visit: Payer: Self-pay

## 2023-07-08 ENCOUNTER — Other Ambulatory Visit: Payer: Self-pay | Admitting: Urology

## 2023-07-08 NOTE — Telephone Encounter (Signed)
 Good Morning Dr. Bruce Caper,  We have received a surgical clearance request for [name] for upcoming circumcision procedure. They were seen recently in clinic on 06/01/2023.  Has a PMH of  HFrEF, CAD, cardioembolic stroke in 2023, CKD 3, left bundle branch block, CAD status post PCI in 2024. Can you please comment on surgical clearance and guidance on holding Eliquis and Plavix for his upcoming circumcision procedure. Please forward you guidance and recommendations to P CV DIV PREOP   Thank you, Charles Connor, NP

## 2023-07-08 NOTE — Telephone Encounter (Signed)
   Pre-operative Risk Assessment    Patient Name: Steven Brady  DOB: 1955-10-06 MRN: [id number]   Date of last office visit: 04/05/23 Date of next office visit:  Not yet scheduled   Request for Surgical Clearance    Procedure:   Circumcision  Date of Surgery:  Clearance 07/27/23                                Surgeon:  Dr. Sissy Duff Group or Practice Name:  [facility] Phone number:  [phone]  Fax number:  [phone]    Type of Clearance Requested:   - Medical  - Pharmacy:  Hold Clopidogrel (Plavix) Eliquis   Type of Anesthesia:  General    Additional requests/questions:   Caller Inda Manchester) stated patient will need medical and pharmacy clearance.  Signed, Jasmin B Wilson   07/08/2023, 10:48 AM

## 2023-07-09 NOTE — Telephone Encounter (Signed)
 Dr. Abel Hoe,  We have received a surgical clearance request for Mr. Flannigan for upcoming circumcision procedure.  He has a history of PCI and stent placed to proximal RCA, mRCA and dRCA.  Can you please comment on patient holding Plavix  for upcoming circumcision procedure.Please forward you guidance and recommendations to P CV DIV PREOP   Thank you, Charles Connor, NP

## 2023-07-12 NOTE — Telephone Encounter (Signed)
 LMTCB 1st Attempt RR

## 2023-07-12 NOTE — Telephone Encounter (Signed)
   Name: Steven Brady  DOB: 1955/11/30  MRN: [id number]  Primary Cardiologist: Antoinette Batman, MD   Preoperative team, please contact this patient and set up a phone call appointment for further preoperative risk assessment. Please obtain consent and complete medication review. Thank you for your help.  I confirm that guidance regarding antiplatelet and oral anticoagulation therapy has been completed and, if necessary, noted below.  Per Dr. Abel Hoe, he may hold Plavix  for 5 days prior to procedure.  Per Dr. Bruce Caper, he may hold Eliquis  for 72 hours prior to procedure.  Please resume Plavix  and Eliquis  as soon as possible postprocedure, at the discretion of the surgeon.  I also confirmed the patient resides in the state of [place] . As per [facility] Medical Board telemedicine laws, the patient must reside in the state in which the provider is licensed.   Jude Norton, NP 07/12/2023, 1:34 PM [facility]

## 2023-07-19 ENCOUNTER — Telehealth: Payer: Self-pay

## 2023-07-19 NOTE — Telephone Encounter (Signed)
  Patient Consent for Virtual Visit        Steven Brady has provided verbal consent on 07/19/2023 for a virtual visit (video or telephone).   CONSENT FOR VIRTUAL VISIT FOR:  Steven Brady  By participating in this virtual visit I agree to the following:  I hereby voluntarily request, consent and authorize [facility] and its employed or contracted physicians, physician assistants, nurse practitioners or other licensed health care professionals (the Practitioner), to provide me with telemedicine health care services (the "Services") as deemed necessary by the treating Practitioner. I acknowledge and consent to receive the Services by the Practitioner via telemedicine. I understand that the telemedicine visit will involve communicating with the Practitioner through live audiovisual communication technology and the disclosure of certain medical information by electronic transmission. I acknowledge that I have been given the opportunity to request an in-person assessment or other available alternative prior to the telemedicine visit and am voluntarily participating in the telemedicine visit.  I understand that I have the right to withhold or withdraw my consent to the use of telemedicine in the course of my care at any time, without affecting my right to future care or treatment, and that the Practitioner or I may terminate the telemedicine visit at any time. I understand that I have the right to inspect all information obtained and/or recorded in the course of the telemedicine visit and may receive copies of available information for a reasonable fee.  I understand that some of the potential risks of receiving the Services via telemedicine include:  Delay or interruption in medical evaluation due to technological equipment failure or disruption; Information transmitted may not be sufficient (e.g. poor resolution of images) to allow for appropriate medical decision making by the  Practitioner; and/or  In rare instances, security protocols could fail, causing a breach of personal health information.  Furthermore, I acknowledge that it is my responsibility to provide information about my medical history, conditions and care that is complete and accurate to the best of my ability. I acknowledge that Practitioner's advice, recommendations, and/or decision may be based on factors not within their control, such as incomplete or inaccurate data provided by me or distortions of diagnostic images or specimens that may result from electronic transmissions. I understand that the practice of medicine is not an exact science and that Practitioner makes no warranties or guarantees regarding treatment outcomes. I acknowledge that a copy of this consent can be made available to me via my patient portal [facility] MyChart), or I can request a printed copy by calling the office of [facility].    I understand that my insurance will be billed for this visit.   I have read or had this consent read to me. I understand the contents of this consent, which adequately explains the benefits and risks of the Services being provided via telemedicine.  I have been provided ample opportunity to ask questions regarding this consent and the Services and have had my questions answered to my satisfaction. I give my informed consent for the services to be provided through the use of telemedicine in my medical care

## 2023-07-19 NOTE — Telephone Encounter (Signed)
 Pt returning call from 4/21 regarding Clearance. Please advise

## 2023-07-20 ENCOUNTER — Ambulatory Visit: Attending: Cardiovascular Disease

## 2023-07-20 DIAGNOSIS — Z01810 Encounter for preprocedural cardiovascular examination: Secondary | ICD-10-CM

## 2023-07-20 NOTE — Progress Notes (Signed)
 Virtual Visit via Telephone Note   Because of Jacqueline Holbrook co-morbid illnesses, he is at least at moderate risk for complications without adequate follow up.  This format is felt to be most appropriate for this patient at this time.  Due to technical limitations with video connection (technology), today's appointment will be conducted as an audio only telehealth visit, and Steven Brady verbally agreed to proceed in this manner.   All issues noted in this document were discussed and addressed.  No physical exam could be performed with this format.  Evaluation Performed:  Preoperative cardiovascular risk assessment _____________   Date:  07/20/2023   Patient ID:  Steven Brady, DOB 1956/01/09, MRN [id number] Patient Location:  Home Provider location:   Office  Primary Care Provider:  Nita Bast, NP Primary Cardiologist:  Antoinette Batman, MD  Chief Complaint / Patient Profile   68 y.o. y/o male with a h/o hypertension, coronary artery disease, hyperlipidemia who is pending circumcision and presents today for telephonic preoperative cardiovascular risk assessment.  History of Present Illness    Steven Brady is a 68 y.o. male who presents via audio/video conferencing for a telehealth visit today.  Pt was last seen in cardiology clinic on 06/01/2023 by Dr. Bruce Caper  At that time Steven Brady was doing well .  The patient is now pending procedure as outlined above. Since his last visit, he remains stable from a cardiac standpoint.  Today he denies chest pain, shortness of breath, lower extremity edema, fatigue, palpitations, melena, hematuria, hemoptysis, diaphoresis, weakness, presyncope, syncope, orthopnea, and PND.   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   recently hurt left ankle - 2 weeks ago   CAD (coronary artery disease)    CHF (congestive heart failure) (HCC) 03/23/2013   Chronic HFrEF (heart  failure with reduced ejection fraction) (HCC)    Chronic kidney disease, stage 3 (HCC)    Hematuria - cause not known 06/08/2011   pt has been seeing blood off and on in urine   Hyperlipidemia Dx 2015   Hypertension Dx 2015   LBBB (left bundle branch block)    Stroke (HCC)    Thrombocytopenia (HCC)    Tobacco abuse    since age of 73   Past Surgical History:  Procedure Laterality Date   BIV ICD INSERTION CRT-D N/A 12/31/2022   Procedure: BIV ICD INSERTION CRT-D;  Surgeon: Efraim Grange, MD;  Location: [facility] INVASIVE CV LAB;  Service: Cardiovascular;  Laterality: N/A;   NO PAST SURGERIES      Allergies  No Known Allergies  Home Medications    Prior to Admission medications   Medication Sig Start Date End Date Taking? Authorizing Provider  apixaban  (ELIQUIS ) 5 MG TABS tablet Take 5 mg by mouth 2 (two) times daily.    [provider]  atorvastatin  (LIPITOR) 80 MG tablet TAKE 1 TABLET BY MOUTH EVERY DAY 11/12/22   Aurora Lees, DO  carvedilol  (COREG ) 25 MG tablet Take 1 tablet (25 mg total) by mouth 2 (two) times daily. 06/01/23   Sabharwal, Aditya, DO  Cholecalciferol (VITAMIN D3) 2000 UNITS TABS Take 2,000 Units by mouth daily. 07/31/14   Funches, Josalyn, MD  clopidogrel  (PLAVIX ) 75 MG tablet Take 1 tablet (75 mg total) by mouth in the morning. 06/30/23   Arleene Belt, PA-C  dapagliflozin  propanediol (FARXIGA ) 10 MG TABS tablet Take 1 tablet (10 mg total) by mouth daily. 07/06/23   Andree Bane,  Bernetta Brilliant, PA-C  Evolocumab  (REPATHA  SURECLICK) 140 MG/ML SOAJ Inject 140 mg into the skin every 14 (fourteen) days. 01/12/23   Sabharwal, Aditya, DO  fluticasone  (FLONASE ) 50 MCG/ACT nasal spray Place 1 spray into both nostrils daily as needed for allergies.    [provider]  furosemide  (LASIX ) 20 MG tablet TAKE 1 TABLET (20 MG TOTAL) BY MOUTH AS NEEDED FOR FLUID OR EDEMA. 06/29/23   Bensimhon, Rheta Celestine, MD  hydrALAZINE  (APRESOLINE ) 25 MG tablet Take 1 tablet (25 mg  total) by mouth 3 (three) times daily. Patient taking differently: Take 25 mg by mouth in the morning and at bedtime. 03/03/23   Sabharwal, Aditya, DO  isosorbide  mononitrate (IMDUR ) 30 MG 24 hr tablet Take 30 mg by mouth daily.    [provider]  metFORMIN (GLUCOPHAGE) 500 MG tablet Take 500 mg by mouth 2 (two) times daily with a meal. 09/08/22   [provider]  Multiple Vitamin (MULTIVITAMIN WITH MINERALS) TABS tablet Take 1 tablet by mouth in the morning. Centrum    [provider]  nitroGLYCERIN  (NITROSTAT ) 0.3 MG SL tablet Place 1 tablet (0.3 mg total) under the tongue every 5 (five) minutes as needed for chest pain (Only take medication if you have chest pain. Please go to an emergency department if you have chest pain). 09/26/22 09/26/23  Aurora Lees, DO  sacubitril-valsartan (ENTRESTO) 97-103 MG Take 1 tablet by mouth 2 (two) times daily.    [provider]  spironolactone (ALDACTONE) 25 MG tablet Take 25 mg by mouth in the morning.    [provider]  tiZANidine (ZANAFLEX) 4 MG tablet Take 4 mg by mouth every 8 (eight) hours as needed for muscle spasms. 07/06/23 08/05/23  [provider]    Physical Exam    Vital Signs:  Steven Brady does not have vital signs available for review today.  Given telephonic nature of communication, physical exam is limited. AAOx3. NAD. Normal affect.  Speech and respirations are unlabored.  Accessory Clinical Findings    None  Assessment & Plan    1.  Preoperative Cardiovascular Risk Assessment:Procedure:   Circumcision   Date of Surgery:  Clearance 07/27/23                                  Surgeon:  Dr. Sissy Duff Group or Practice Name:  [facility] Phone number:  [phone]  Fax number:  [phone]      Primary Cardiologist: Antoinette Batman, MD  Chart reviewed as part of pre-operative protocol coverage. Given past medical history and time since last visit,  based on ACC/AHA guidelines, Steven Brady would be at acceptable risk for the planned procedure without further cardiovascular testing.   His RCRI is high risk, greater than 11% risk of major cardiac event.  He is able to complete greater than 4 METS of physical activity.  Patient was advised that if he develops new symptoms prior to surgery to contact our office to arrange a follow-up appointment.  He verbalized understanding.  Per Dr. Abel Hoe, he may hold Plavix  for 5 days prior to procedure.  Per Dr. Bruce Caper, he may hold Eliquis  for 72 hours prior to procedure.  Please resume Plavix  and Eliquis  as soon as possible postprocedure, at the discretion of the surgeon.   I will route this recommendation to the requesting party via Epic fax function and remove from pre-op  pool.  Time:   Today, I have spent 5 minutes with the patient with telehealth technology discussing medical history, symptoms, and management plan.  I spent 10 minutes reviewing past medical history, cardiac medications, and cardiac tests.   Carie Charity, NP  07/20/2023, 6:57 AM

## 2023-07-21 ENCOUNTER — Encounter: Payer: Self-pay | Admitting: Cardiovascular Disease

## 2023-07-21 NOTE — Patient Instructions (Signed)
 DUE TO COVID-19 ONLY TWO VISITORS  (aged 66 and older)  ARE ALLOWED TO COME WITH YOU AND STAY IN THE WAITING ROOM ONLY DURING PRE OP AND PROCEDURE.   **NO VISITORS ARE ALLOWED IN THE SHORT STAY AREA OR RECOVERY ROOM!!**  IF YOU WILL BE ADMITTED INTO THE HOSPITAL YOU ARE ALLOWED ONLY FOUR SUPPORT PEOPLE DURING VISITATION HOURS ONLY (7 AM -8PM)   The support person(s) must pass our screening, gel in and out, and wear a mask at all times, including in the patient's room. Patients must also wear a mask when staff or their support person are in the room. Visitors GUEST BADGE MUST BE WORN VISIBLY  One adult visitor may remain with you overnight and MUST be in the room by 8 P.M.     Your procedure is scheduled on: 07/27/23   Report to [facility] Main Entrance    Report to admitting at : 9:45 AM   Call this number if you have problems the morning of surgery [phone]   Do not eat food :After Midnight.   After Midnight you may have the following liquids until : 9:00 AM DAY OF SURGERY  Water Black Coffee (sugar ok, NO MILK/CREAM OR CREAMERS)  Tea (sugar ok, NO MILK/CREAM OR CREAMERS) regular and decaf                             Plain Jell-O (NO RED)                                           Fruit ices (not with fruit pulp, NO RED)                                     Popsicles (NO RED)                                                                  Juice: apple, WHITE grape, WHITE cranberry Sports drinks like Gatorade (NO RED)              FOLLOW  ANY ADDITIONAL PRE OP INSTRUCTIONS YOU RECEIVED FROM YOUR SURGEON'S OFFICE!!!   Oral Hygiene is also important to reduce your risk of infection.                                    Remember - BRUSH YOUR TEETH THE MORNING OF SURGERY WITH YOUR REGULAR TOOTHPASTE  DENTURES WILL BE REMOVED PRIOR TO SURGERY PLEASE DO NOT APPLY "Poly grip" OR ADHESIVES!!!   Do NOT smoke after Midnight   Take these medicines the morning of surgery with A  SIP OF WATER: isosorbide ,hydralazine ,carvedilol .  STOP TAKING all Vitamins, Herbs and supplements 1 week before your surgery.                               You may not have any metal on your body including hair  pins, jewelry, and body piercing             Do not wear lotions, powders, perfumes/cologne, or deodorant              Men may shave face and neck.   Do not bring valuables to the hospital. [facility] IS NOT             RESPONSIBLE   FOR VALUABLES.   Contacts, glasses, or bridgework may not be worn into surgery.   Bring small overnight bag day of surgery.   DO NOT BRING YOUR HOME MEDICATIONS TO THE HOSPITAL. PHARMACY WILL DISPENSE MEDICATIONS LISTED ON YOUR MEDICATION LIST TO YOU DURING YOUR ADMISSION IN THE HOSPITAL!    Patients discharged on the day of surgery will not be allowed to drive home.  Someone NEEDS to stay with you for the first 24 hours after anesthesia.   Special Instructions: Bring a copy of your healthcare power of attorney and living will documents         the day of surgery if you haven't scanned them before.              Please read over the following fact sheets you were given: IF YOU HAVE QUESTIONS ABOUT YOUR PRE-OP  INSTRUCTIONS PLEASE CALL [phone]    [facility] - Preparing for Surgery Before surgery, you can play an important role.  Because skin is not sterile, your skin needs to be as free of germs as possible.  You can reduce the number of germs on your skin by washing with CHG (chlorahexidine gluconate) soap before surgery.  CHG is an antiseptic cleaner which kills germs and bonds with the skin to continue killing germs even after washing. Please DO NOT use if you have an allergy to CHG or antibacterial soaps.  If your skin becomes reddened/irritated stop using the CHG and inform your nurse when you arrive at Short Stay. Do not shave (including legs and underarms) for at least 48 hours prior to the first CHG shower.  You may shave your  face/neck. Please follow these instructions carefully:  1.  Shower with CHG Soap the night before surgery and the  morning of Surgery.  2.  If you choose to wash your hair, wash your hair first as usual with your  normal  shampoo.  3.  After you shampoo, rinse your hair and body thoroughly to remove the  shampoo.                           4.  Use CHG as you would any other liquid soap.  You can apply chg directly  to the skin and wash                       Gently with a scrungie or clean washcloth.  5.  Apply the CHG Soap to your body ONLY FROM THE NECK DOWN.   Do not use on face/ open                           Wound or open sores. Avoid contact with eyes, ears mouth and genitals (private parts).                       Wash face,  Genitals (private parts) with your normal soap.  6.  Wash thoroughly, paying special attention to the area where your surgery  will be performed.  7.  Thoroughly rinse your body with warm water from the neck down.  8.  DO NOT shower/wash with your normal soap after using and rinsing off  the CHG Soap.                9.  Pat yourself dry with a clean towel.            10.  Wear clean pajamas.            11.  Place clean sheets on your bed the night of your first shower and do not  sleep with pets. Day of Surgery : Do not apply any lotions/deodorants the morning of surgery.  Please wear clean clothes to the hospital/surgery center.  FAILURE TO FOLLOW THESE INSTRUCTIONS MAY RESULT IN THE CANCELLATION OF YOUR SURGERY PATIENT SIGNATURE_________________________________  NURSE SIGNATURE__________________________________  ________________________________________________________________________

## 2023-07-21 NOTE — Progress Notes (Signed)
 PERIOPERATIVE PRESCRIPTION FOR IMPLANTED CARDIAC DEVICE PROGRAMMING  Patient Information: Name:  Steven Brady  DOB:  1955-07-06  MRN:  [id number]  Planned Procedure:  Circumcision  Surgeon:  Dr. Josefine Nice  Date of Procedure:  07/27/23  Cautery will be used.  Position during surgery:   (not provided) Device Information:  Clinic EP Physician:  Dr. Marlane Silver  Device Type:  Defibrillator Manufacturer and Phone #:  [employer]: [phone] Pacemaker Dependent?:  No. Date of Last Device Check:  06/30/2023 Normal Device Function?:  Yes.    Electrophysiologist's Recommendations:  Have magnet available. Provide continuous ECG monitoring when magnet is used or reprogramming is to be performed.  Procedure should not interfere with device function.  No device programming or magnet placement needed.  Per Device Clinic Standing Orders, Forrestine Ike, RN  4:54 PM 07/21/2023

## 2023-07-22 ENCOUNTER — Encounter (HOSPITAL_COMMUNITY): Payer: Self-pay

## 2023-07-22 ENCOUNTER — Encounter (HOSPITAL_COMMUNITY)
Admission: RE | Admit: 2023-07-22 | Discharge: 2023-07-22 | Disposition: A | Discharge status: Home / Self Care | Source: Ambulatory Visit | Attending: Urology | Admitting: Urology

## 2023-07-22 ENCOUNTER — Other Ambulatory Visit: Payer: Self-pay

## 2023-07-22 VITALS — BP 133/86 | HR 86 | Temp 98.7°F | Resp 18 | Ht 67.0 in | Wt 201.1 lb

## 2023-07-22 DIAGNOSIS — Z01818 Encounter for other preprocedural examination: Secondary | ICD-10-CM | POA: Diagnosis present

## 2023-07-22 DIAGNOSIS — Z7984 Long term (current) use of oral hypoglycemic drugs: Secondary | ICD-10-CM | POA: Insufficient documentation

## 2023-07-22 DIAGNOSIS — Z7902 Long term (current) use of antithrombotics/antiplatelets: Secondary | ICD-10-CM | POA: Insufficient documentation

## 2023-07-22 DIAGNOSIS — Z01812 Encounter for preprocedural laboratory examination: Secondary | ICD-10-CM | POA: Diagnosis not present

## 2023-07-22 DIAGNOSIS — Z7901 Long term (current) use of anticoagulants: Secondary | ICD-10-CM | POA: Insufficient documentation

## 2023-07-22 DIAGNOSIS — N471 Phimosis: Secondary | ICD-10-CM | POA: Diagnosis not present

## 2023-07-22 DIAGNOSIS — Z95810 Presence of automatic (implantable) cardiac defibrillator: Secondary | ICD-10-CM | POA: Diagnosis not present

## 2023-07-22 DIAGNOSIS — I130 Hypertensive heart and chronic kidney disease with heart failure and stage 1 through stage 4 chronic kidney disease, or unspecified chronic kidney disease: Secondary | ICD-10-CM | POA: Diagnosis not present

## 2023-07-22 DIAGNOSIS — I2510 Atherosclerotic heart disease of native coronary artery without angina pectoris: Secondary | ICD-10-CM | POA: Diagnosis not present

## 2023-07-22 DIAGNOSIS — N183 Chronic kidney disease, stage 3 unspecified: Secondary | ICD-10-CM | POA: Diagnosis not present

## 2023-07-22 DIAGNOSIS — Z8673 Personal history of transient ischemic attack (TIA), and cerebral infarction without residual deficits: Secondary | ICD-10-CM | POA: Diagnosis not present

## 2023-07-22 DIAGNOSIS — I447 Left bundle-branch block, unspecified: Secondary | ICD-10-CM | POA: Diagnosis not present

## 2023-07-22 DIAGNOSIS — N481 Balanitis: Secondary | ICD-10-CM | POA: Insufficient documentation

## 2023-07-22 DIAGNOSIS — I10 Essential (primary) hypertension: Secondary | ICD-10-CM

## 2023-07-22 DIAGNOSIS — Z955 Presence of coronary angioplasty implant and graft: Secondary | ICD-10-CM | POA: Insufficient documentation

## 2023-07-22 DIAGNOSIS — I5022 Chronic systolic (congestive) heart failure: Secondary | ICD-10-CM | POA: Diagnosis not present

## 2023-07-22 DIAGNOSIS — E1122 Type 2 diabetes mellitus with diabetic chronic kidney disease: Secondary | ICD-10-CM | POA: Insufficient documentation

## 2023-07-22 HISTORY — DX: Presence of automatic (implantable) cardiac defibrillator: Z95.810

## 2023-07-22 HISTORY — DX: Unspecified osteoarthritis, unspecified site: M19.90

## 2023-07-22 HISTORY — DX: Type 2 diabetes mellitus without complications: E11.9

## 2023-07-22 LAB — CBC
HCT: 37.8 % — ABNORMAL LOW (ref 39.0–52.0)
Hemoglobin: 12.2 g/dL — ABNORMAL LOW (ref 13.0–17.0)
MCH: 30.3 pg (ref 26.0–34.0)
MCHC: 32.3 g/dL (ref 30.0–36.0)
MCV: 94 fL (ref 80.0–100.0)
Platelets: 143 10*3/uL — ABNORMAL LOW (ref 150–400)
RBC: 4.02 MIL/uL — ABNORMAL LOW (ref 4.22–5.81)
RDW: 13.2 % (ref 11.5–15.5)
WBC: 6 10*3/uL (ref 4.0–10.5)
nRBC: 0 % (ref 0.0–0.2)

## 2023-07-22 LAB — GLUCOSE, CAPILLARY: Glucose-Capillary: 84 mg/dL (ref 70–99)

## 2023-07-22 NOTE — Progress Notes (Signed)
 For Anesthesia: PCP - Nita Bast, NP  Cardiologist - Odie Benne, MD  Clearance: Chet Cota Cleaver: NP: 07/20/23 Bowel Prep reminder:  Chest x-ray - 12/31/22 EKG - 04/05/23 Stress Test -  ECHO - 06/30/23 Cardiac Cath -  Pacemaker/ICD device last checked:06/30/2023  Pacemaker orders received:Yes Device Rep notified:N/A  Spinal Cord Stimulator:N/A  Sleep Study - N/A CPAP -   Fasting Blood Sugar - N/A Checks Blood Sugar __3___ times a week Date and result of last Hgb A1c-7.1: 06/18/23  Last dose of GLP1 agonist- N/A GLP1 instructions:   Last dose of SGLT-2 inhibitors- Farxiga  SGLT-2 instructions: To hold after: 07/23/23  Blood Thinner Instructions:Eliquis  will be hold after: 07/22/23. Plavix : On hold since 07/21/23 Aspirin  Instructions: Last Dose:  Activity level: Can go up a flight of stairs and activities of daily living without stopping and without chest pain and/or shortness of breath   Able to exercise without chest pain and/or shortness of breath  Anesthesia review: Hx: CAD,CHF,CKD III,Left BBB,Defibrillator,Stroke.  Patient denies shortness of breath, fever, cough and chest pain at PAT appointment   Patient verbalized understanding of instructions that were given to them at the PAT appointment. Patient was also instructed that they will need to review over the PAT instructions again at home before surgery.

## 2023-07-23 NOTE — Anesthesia Preprocedure Evaluation (Signed)
 Anesthesia Evaluation  Patient identified by MRN, date of birth, ID band Patient awake    Reviewed: Allergy & Precautions, H&P , NPO status , Patient's Chart, lab work & pertinent test results  Airway Mallampati: II  TM Distance: >3 FB Neck ROM: Full    Dental no notable dental hx.    Pulmonary neg pulmonary ROS, former smoker   Pulmonary exam normal breath sounds clear to auscultation       Cardiovascular hypertension, Pt. on medications + CAD and +CHF  Normal cardiovascular exam+ Cardiac Defibrillator  Rhythm:Regular Rate:Normal     Neuro/Psych CVA  negative psych ROS   GI/Hepatic negative GI ROS, Neg liver ROS,,,  Endo/Other  negative endocrine ROSdiabetes, Type 2    Renal/GU Renal InsufficiencyRenal disease  negative genitourinary   Musculoskeletal  (+) Arthritis , Osteoarthritis,    Abdominal  (+) + obese  Peds negative pediatric ROS (+)  Hematology negative hematology ROS (+)   Anesthesia Other Findings   Reproductive/Obstetrics negative OB ROS                             Anesthesia Physical Anesthesia Plan  ASA: 2  Anesthesia Plan: General   Post-op Pain Management:    Induction: Intravenous  PONV Risk Score and Plan: 2 and Ondansetron , Midazolam  and Treatment may vary due to age or medical condition  Airway Management Planned: LMA  Additional Equipment:   Intra-op Plan:   Post-operative Plan: Extubation in OR  Informed Consent: I have reviewed the patients History and Physical, chart, labs and discussed the procedure including the risks, benefits and alternatives for the proposed anesthesia with the patient or authorized representative who has indicated his/her understanding and acceptance.     Dental advisory given  Plan Discussed with: CRNA  Anesthesia Plan Comments: (See PAT note 07/22/23)       Anesthesia Quick Evaluation

## 2023-07-23 NOTE — Progress Notes (Signed)
 Anesthesia Chart Review   Case: [id number] Date/Time: 07/27/23 1145   Procedure: CIRCUMCISION, ADULT   Anesthesia type: General   Diagnosis:      Phimosis [N47.1]     Balanitis [N48.1]   Pre-op  diagnosis: PHIMOSIS, BALANITIS   Location: [facility] ROOM 04 / [facility]   Surgeons: Mallie Seal, MD       DISCUSSION:68 y.o. former smoker with h/o HTN, LBBB, HFrEF, AICD in place (device orders in 07/21/2023), CAD s/p PCI to RCA, cardioembolic stroke in 2023, CKD Stage III, DM II, phimosis, balantitis scheduled for above procedure 07/27/2023 with Dr. Josefine Nice.   Per cardiology preoperative evaluation 07/20/2023, "Chart reviewed as part of pre-operative protocol coverage. Given past medical history and time since last visit, based on ACC/AHA guidelines, Steven Brady would be at acceptable risk for the planned procedure without further cardiovascular testing.    His RCRI is high risk, greater than 11% risk of major cardiac event.  He is able to complete greater than 4 METS of physical activity.   Patient was advised that if he develops new symptoms prior to surgery to contact our office to arrange a follow-up appointment.  He verbalized understanding.   Per Dr. Abel Hoe, he may hold Plavix  for 5 days prior to procedure.  Per Dr. Bruce Caper, he may hold Eliquis  for 72 hours prior to procedure.  Please resume Plavix  and Eliquis  as soon as possible postprocedure, at the discretion of the surgeon. "  Pt reports last dose of Eliquis  07/22/2023. Pt reports last dose of Plavix  07/21/2023.  VS: BP 133/86   Pulse 86   Temp 37.1 C (Oral)   Resp 18   Ht 5\' 7"  (1.702 m)   Wt 91.2 kg   SpO2 98%   BMI 31.50 kg/m   PROVIDERS: Nita Bast, NP is PCP   Cardiologist - Odie Benne, MD  LABS: Labs reviewed: Acceptable for surgery. (all labs ordered are listed, but only abnormal results are displayed)  Labs Reviewed  CBC - Abnormal; Notable for the following components:      Result Value    RBC 4.02 (*)    Hemoglobin 12.2 (*)    HCT 37.8 (*)    Platelets 143 (*)    All other components within normal limits  GLUCOSE, CAPILLARY     IMAGES:   EKG:   CV: Echo 05/14/23 1. Left ventricular ejection fraction, by estimation, is 35 to 40%. The  left ventricle has moderately decreased function. The left ventricle  demonstrates global hypokinesis. Left ventricular diastolic parameters are  consistent with Grade I diastolic  dysfunction (impaired relaxation).   2. Right ventricular systolic function is normal. The right ventricular  size is normal.   3. Left atrial size was moderately dilated.   4. The mitral valve is normal in structure. No evidence of mitral valve  regurgitation. No evidence of mitral stenosis.   5. The aortic valve was not well visualized. There is mild calcification  of the aortic valve. Aortic valve regurgitation is mild. Aortic valve  sclerosis is present, with no evidence of aortic valve stenosis.   6. The inferior vena cava is normal in size with greater than 50%  respiratory variability, suggesting right atrial pressure of 3 mmHg.  Past Medical History:  Diagnosis Date   AICD (automatic cardioverter/defibrillator) present    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   recently hurt left ankle - 2 weeks ago   Arthritis    CAD (coronary artery disease)    CHF (  congestive heart failure) (HCC) 03/23/2013   Chronic HFrEF (heart failure with reduced ejection fraction) (HCC)    Chronic kidney disease, stage 3 (HCC)    Diabetes mellitus without complication (HCC)    Hematuria - cause not known 06/08/2011   pt has been seeing blood off and on in urine   Hyperlipidemia Dx 2015   Hypertension Dx 2015   LBBB (left bundle branch block)    Stroke (HCC)    Thrombocytopenia (HCC)    Tobacco abuse    since age of 41    Past Surgical History:  Procedure Laterality Date   BIV ICD INSERTION CRT-D N/A 12/31/2022   Procedure: BIV ICD INSERTION CRT-D;  Surgeon: Efraim Grange, MD;  [facility];  Service: Cardiovascular;  Laterality: N/A;   CATARACT EXTRACTION, BILATERAL     TONSILLECTOMY      MEDICATIONS:  apixaban  (ELIQUIS ) 5 MG TABS tablet   atorvastatin  (LIPITOR) 80 MG tablet   carvedilol  (COREG ) 25 MG tablet   Cholecalciferol (VITAMIN D3) 2000 UNITS TABS   clopidogrel  (PLAVIX ) 75 MG tablet   dapagliflozin  propanediol (FARXIGA ) 10 MG TABS tablet   Evolocumab  (REPATHA  SURECLICK) 140 MG/ML SOAJ   fluticasone  (FLONASE ) 50 MCG/ACT nasal spray   furosemide  (LASIX ) 20 MG tablet   hydrALAZINE  (APRESOLINE ) 25 MG tablet   isosorbide  mononitrate (IMDUR ) 30 MG 24 hr tablet   metFORMIN (GLUCOPHAGE) 500 MG tablet   Multiple Vitamin (MULTIVITAMIN WITH MINERALS) TABS tablet   nitroGLYCERIN  (NITROSTAT ) 0.3 MG SL tablet   sacubitril-valsartan (ENTRESTO) 97-103 MG   spironolactone (ALDACTONE) 25 MG tablet   tiZANidine (ZANAFLEX) 4 MG tablet   No current facility-administered medications for this encounter.     Chick Cotton Ward, PA-C [facility] [phone]

## 2023-07-27 ENCOUNTER — Ambulatory Visit (HOSPITAL_COMMUNITY)
Admission: RE | Admit: 2023-07-27 | Discharge: 2023-07-27 | Disposition: A | Discharge status: Home / Self Care | Attending: Urology | Admitting: Urology

## 2023-07-27 ENCOUNTER — Other Ambulatory Visit: Payer: Self-pay

## 2023-07-27 ENCOUNTER — Encounter (HOSPITAL_COMMUNITY): Payer: Self-pay | Admitting: Urology

## 2023-07-27 ENCOUNTER — Ambulatory Visit (HOSPITAL_COMMUNITY): Admitting: Physician Assistant

## 2023-07-27 ENCOUNTER — Ambulatory Visit (HOSPITAL_BASED_OUTPATIENT_CLINIC_OR_DEPARTMENT_OTHER): Admitting: Anesthesiology

## 2023-07-27 ENCOUNTER — Encounter (HOSPITAL_COMMUNITY)
Admission: RE | Disposition: A | Discharge status: Home / Self Care | Payer: Self-pay | Source: Home / Self Care | Attending: Urology

## 2023-07-27 DIAGNOSIS — M1990 Unspecified osteoarthritis, unspecified site: Secondary | ICD-10-CM | POA: Insufficient documentation

## 2023-07-27 DIAGNOSIS — I130 Hypertensive heart and chronic kidney disease with heart failure and stage 1 through stage 4 chronic kidney disease, or unspecified chronic kidney disease: Secondary | ICD-10-CM | POA: Insufficient documentation

## 2023-07-27 DIAGNOSIS — Z87891 Personal history of nicotine dependence: Secondary | ICD-10-CM | POA: Insufficient documentation

## 2023-07-27 DIAGNOSIS — Z7984 Long term (current) use of oral hypoglycemic drugs: Secondary | ICD-10-CM | POA: Diagnosis not present

## 2023-07-27 DIAGNOSIS — Z8673 Personal history of transient ischemic attack (TIA), and cerebral infarction without residual deficits: Secondary | ICD-10-CM | POA: Diagnosis not present

## 2023-07-27 DIAGNOSIS — Z95810 Presence of automatic (implantable) cardiac defibrillator: Secondary | ICD-10-CM | POA: Insufficient documentation

## 2023-07-27 DIAGNOSIS — Z79899 Other long term (current) drug therapy: Secondary | ICD-10-CM | POA: Diagnosis not present

## 2023-07-27 DIAGNOSIS — N481 Balanitis: Secondary | ICD-10-CM | POA: Diagnosis not present

## 2023-07-27 DIAGNOSIS — N183 Chronic kidney disease, stage 3 unspecified: Secondary | ICD-10-CM | POA: Insufficient documentation

## 2023-07-27 DIAGNOSIS — E1122 Type 2 diabetes mellitus with diabetic chronic kidney disease: Secondary | ICD-10-CM | POA: Diagnosis not present

## 2023-07-27 DIAGNOSIS — I2510 Atherosclerotic heart disease of native coronary artery without angina pectoris: Secondary | ICD-10-CM | POA: Insufficient documentation

## 2023-07-27 DIAGNOSIS — N471 Phimosis: Secondary | ICD-10-CM

## 2023-07-27 DIAGNOSIS — I5022 Chronic systolic (congestive) heart failure: Secondary | ICD-10-CM | POA: Diagnosis not present

## 2023-07-27 LAB — GLUCOSE, CAPILLARY
Glucose-Capillary: 131 mg/dL — ABNORMAL HIGH (ref 70–99)
Glucose-Capillary: 103 mg/dL — ABNORMAL HIGH (ref 70–99)

## 2023-07-27 SURGERY — CIRCUMCISION, ADULT
Anesthesia: General

## 2023-07-27 MED ORDER — LIDOCAINE HCL (PF) 1 % IJ SOLN
INTRAMUSCULAR | Status: AC
  Filled 2023-07-27: qty 30

## 2023-07-27 MED ORDER — PHENYLEPHRINE HCL-NACL 20-0.9 MG/250ML-% IV SOLN
INTRAVENOUS | Status: DC | PRN
  Administered 2023-07-27: 35 ug/min via INTRAVENOUS

## 2023-07-27 MED ORDER — SODIUM CHLORIDE 0.9 % IV SOLN: 12.5000 mg | INTRAVENOUS | Status: DC | PRN

## 2023-07-27 MED ORDER — AMISULPRIDE (ANTIEMETIC) 5 MG/2ML IV SOLN: 10.0000 mg | Freq: Once | INTRAVENOUS | Status: DC | PRN

## 2023-07-27 MED ORDER — ORAL CARE MOUTH RINSE: 15.0000 mL | Freq: Once | OROMUCOSAL | Status: AC

## 2023-07-27 MED ORDER — BUPIVACAINE HCL (PF) 0.25 % IJ SOLN
INTRAMUSCULAR | Status: DC | PRN
  Administered 2023-07-27: 10 mL

## 2023-07-27 MED ORDER — BUPIVACAINE HCL (PF) 0.25 % IJ SOLN
INTRAMUSCULAR | Status: AC
  Filled 2023-07-27: qty 30

## 2023-07-27 MED ORDER — LACTATED RINGERS IV SOLN: INTRAVENOUS | Status: DC

## 2023-07-27 MED ORDER — 0.9 % SODIUM CHLORIDE (POUR BTL) OPTIME
TOPICAL | Status: DC | PRN
Start: 2023-07-27 — End: 2023-07-27
  Administered 2023-07-27: 1000 mL

## 2023-07-27 MED ORDER — LIDOCAINE HCL (PF) 2 % IJ SOLN
INTRAMUSCULAR | Status: AC
  Filled 2023-07-27: qty 5

## 2023-07-27 MED ORDER — ONDANSETRON HCL 4 MG/2ML IJ SOLN
INTRAMUSCULAR | Status: DC | PRN
  Administered 2023-07-27: 4 mg via INTRAVENOUS

## 2023-07-27 MED ORDER — CHLORHEXIDINE GLUCONATE 0.12 % MT SOLN
15.0000 mL | Freq: Once | OROMUCOSAL | Status: AC
  Administered 2023-07-27: 15 mL via OROMUCOSAL

## 2023-07-27 MED ORDER — MIDAZOLAM HCL 2 MG/2ML IJ SOLN
INTRAMUSCULAR | Status: DC | PRN
  Administered 2023-07-27: 2 mg via INTRAVENOUS

## 2023-07-27 MED ORDER — MIDAZOLAM HCL 2 MG/2ML IJ SOLN
INTRAMUSCULAR | Status: AC
  Filled 2023-07-27: qty 2

## 2023-07-27 MED ORDER — CEFAZOLIN SODIUM-DEXTROSE 2-4 GM/100ML-% IV SOLN
2.0000 g | INTRAVENOUS | Status: AC
  Administered 2023-07-27: 2 g via INTRAVENOUS
  Filled 2023-07-27: qty 100

## 2023-07-27 MED ORDER — TRAMADOL HCL 50 MG PO TABS: 50.0000 mg | ORAL_TABLET | Freq: Four times a day (QID) | ORAL | 0 refills | Status: AC | PRN

## 2023-07-27 MED ORDER — OXYCODONE HCL 5 MG PO TABS: 5.0000 mg | ORAL_TABLET | Freq: Once | ORAL | Status: DC | PRN

## 2023-07-27 MED ORDER — INSULIN ASPART 100 UNIT/ML IJ SOLN: 0.0000 [IU] | INTRAMUSCULAR | Status: DC | PRN

## 2023-07-27 MED ORDER — OXYCODONE HCL 5 MG/5ML PO SOLN: 5.0000 mg | Freq: Once | ORAL | Status: DC | PRN

## 2023-07-27 MED ORDER — LIDOCAINE HCL 1 % IJ SOLN
INTRAMUSCULAR | Status: DC | PRN
  Administered 2023-07-27: 10 mL

## 2023-07-27 MED ORDER — LIDOCAINE HCL (CARDIAC) PF 100 MG/5ML IV SOSY
PREFILLED_SYRINGE | INTRAVENOUS | Status: DC | PRN
  Administered 2023-07-27: 60 mg via INTRAVENOUS

## 2023-07-27 MED ORDER — PROPOFOL 10 MG/ML IV BOLUS
INTRAVENOUS | Status: DC | PRN
  Administered 2023-07-27: 150 mg via INTRAVENOUS

## 2023-07-27 MED ORDER — DEXAMETHASONE SODIUM PHOSPHATE 10 MG/ML IJ SOLN
INTRAMUSCULAR | Status: DC | PRN
  Administered 2023-07-27: 10 mg via INTRAVENOUS

## 2023-07-27 MED ORDER — PROPOFOL 10 MG/ML IV BOLUS
INTRAVENOUS | Status: AC
  Filled 2023-07-27: qty 20

## 2023-07-27 MED ORDER — HYDROMORPHONE HCL 1 MG/ML IJ SOLN: 0.2500 mg | INTRAMUSCULAR | Status: DC | PRN

## 2023-07-27 MED ORDER — ONDANSETRON HCL 4 MG/2ML IJ SOLN
INTRAMUSCULAR | Status: AC
  Filled 2023-07-27: qty 2

## 2023-07-27 MED ORDER — FENTANYL CITRATE (PF) 100 MCG/2ML IJ SOLN
INTRAMUSCULAR | Status: DC | PRN
  Administered 2023-07-27 (×2): 25 ug via INTRAVENOUS

## 2023-07-27 MED ORDER — ACETAMINOPHEN 500 MG PO TABS: 1000.0000 mg | ORAL_TABLET | Freq: Four times a day (QID) | ORAL | 0 refills | Status: DC

## 2023-07-27 MED ORDER — FENTANYL CITRATE (PF) 100 MCG/2ML IJ SOLN
INTRAMUSCULAR | Status: AC
  Filled 2023-07-27: qty 2

## 2023-07-27 MED ORDER — DEXAMETHASONE SODIUM PHOSPHATE 10 MG/ML IJ SOLN
INTRAMUSCULAR | Status: AC
  Filled 2023-07-27: qty 1

## 2023-07-27 SURGICAL SUPPLY — 25 items
BAG COUNTER SPONGE SURGICOUNT (BAG) IMPLANT
BLADE SURG 15 STRL LF DISP TIS (BLADE) ×1 IMPLANT
BNDG COHESIVE 2X5 TAN ST LF (GAUZE/BANDAGES/DRESSINGS) ×1 IMPLANT
COVER SURGICAL LIGHT HANDLE (MISCELLANEOUS) ×1 IMPLANT
DRAPE LAPAROTOMY T 98X78 PEDS (DRAPES) ×1 IMPLANT
DRSG XEROFORM 1X8 (GAUZE/BANDAGES/DRESSINGS) ×1 IMPLANT
ELECT NDL TIP 2.8 STRL (NEEDLE) ×1 IMPLANT
ELECT NEEDLE TIP 2.8 STRL (NEEDLE) ×1 IMPLANT
ELECT PENCIL ROCKER SW 15FT (MISCELLANEOUS) ×1 IMPLANT
ELECT REM PT RETURN 15FT ADLT (MISCELLANEOUS) ×1 IMPLANT
GAUZE SPONGE 4X4 12PLY STRL LF (GAUZE/BANDAGES/DRESSINGS) ×1 IMPLANT
GAUZE STRETCH 2X75IN STRL (MISCELLANEOUS) ×1 IMPLANT
GLOVE SS BIOGEL STRL SZ 7 (GLOVE) ×1 IMPLANT
GLOVE SURG LX STRL 7.5 STRW (GLOVE) ×1 IMPLANT
GOWN STRL REUS W/ TWL XL LVL3 (GOWN DISPOSABLE) ×1 IMPLANT
KIT BASIN OR (CUSTOM PROCEDURE TRAY) ×1 IMPLANT
KIT TURNOVER KIT A (KITS) IMPLANT
NS IRRIG 1000ML POUR BTL (IV SOLUTION) ×1 IMPLANT
PACK BASIC VI WITH GOWN DISP (CUSTOM PROCEDURE TRAY) ×1 IMPLANT
SUT CHROMIC 3 0 SH 27 (SUTURE) ×1 IMPLANT
SUT CHROMIC 4 0 RB 1X27 (SUTURE) IMPLANT
SYR CONTROL 10ML LL (SYRINGE) ×1 IMPLANT
TOWEL OR 17X26 10 PK STRL BLUE (TOWEL DISPOSABLE) IMPLANT
WATER STERILE IRR 1000ML POUR (IV SOLUTION) IMPLANT
YANKAUER SUCT BULB TIP 10FT TU (MISCELLANEOUS) IMPLANT

## 2023-07-27 NOTE — Op Note (Signed)
 Pre-operative diagnosis: Phimosis Balanitis  Postoperative diagnosis: same  Procedure:  Circumcision Penile block  Surgeon: Julene Oaks. M.D.  Asst: Alla Ar  Anesthesia: General  Complications: None  EBL: Minimal  Specimens: none  Indication: Steven Brady is a 68 y.o. male who was diagnosed with phimosis, balanitis.  He elected to proceed with circumcision for treatment. The potential risks, complications, alternative options, and expected recovery process was discussed in detail and informed consent was obtained.  Description of procedure: The patient was taken to the operating room and a general anesthetic was administered. He was given preoperative antibiotics, placed in the supine position, and his genitalia was prepped and draped in the usual sterile fashion. Next, a preoperative timeout was performed.  A dorsal and circumferential penile block was done using a 50-50 mixture of 0.25 bupivacaine and 1% lidocaine .   The penis was examined and appropriate incision sites were marked circumferentially proximal and distally. These incision sites were then incised with the knife circumferentially allowing the skin to be separated distally and approximately and isolating the foreskin. Hemostats were then placed in the dorsal aspect of the foreskin and Metzenbaum scissors were used to create an opening connecting the proximal and distal incision sites. The remainder of the foreskin was then excised utilizing cautery dissection as necessary. Once the foreskin was removed, the underlying penile tissue and dartos fascia was examined and hemostasis was achieved with cautery as necessary. The proximal and distal incision sites were then reapproximated utilizing 3-0 chromic suture. 4 interrupted sutures were placed dorsally, ventrally, and on either side. The intervening tissue between each quadrant was then reapproximated with running 3-0 chromic suture.  A gauze dressing was  then placed and secured with Coband.   The patient tolerated the procedure well and without complications. He was able to be awakened and transferred to the recovery unit in satisfactory condition.  Julene Oaks MD 07/27/2023, 12:39 PM  [facility]  Pager: [phone]

## 2023-07-27 NOTE — Discharge Instructions (Addendum)
 Postoperative instructions for circumcision  Wound:  Remove the dressing the morning after surgery. In most cases your incision will have absorbable sutures that run along the course of your incision and will dissolve within the first 10-20 days. Some will fall out even earlier. Expect some redness as the sutures dissolved but this should occur only around the sutures. If there is generalized redness, especially with increasing pain or swelling, let us know. The penis will possibly get "black and blue" as the blood in the tissues spread. Sometimes the whole penis will turn colors. The black and blue is followed by a yellow and brown color. In time, all the discoloration will go away.  Diet:  You may return to your normal diet within 24 hours following your surgery. You may note some mild nausea and possibly vomiting the first 6-8 hours following surgery. This is usually due to the side effects of anesthesia, and will disappear quite soon. I would suggest clear liquids and a very light meal the first evening following your surgery.  Activity:  Your physical activity should be restricted the first 48 hours. During that time you should remain relatively inactive, moving about only when necessary. During the first 2 weeks following surgery he should avoid lifting any heavy objects (anything greater than 15 pounds), and avoid strenuous exercise. If you work, ask [place] specifically about your restrictions, both for work and home. We will write a note to your employer if needed.   Do not "use" your penis for 3 weeks or until the incisions are completely healed, whichever comes later.  Ice packs can be placed on and off over the penis for the first 48 hours to help relieve the pain and keep the swelling down. Frozen peas or corn in a ZipLock bag can be frozen, used and re-frozen. Fifteen minutes on and 15 minutes off is a reasonable schedule.  No sexual activity for 1 month.  Hygiene:  You may shower 24  hours after your surgery. Make sure wound is clean and dry afterwards. Tub bathing should be restricted until the seventh day.  Medication:  You will be sent home with some type of pain medication. In many cases you will be sent home with a narcotic pain pill (Vicodin or Tylox). If the pain is not too bad, you may take either Tylenol (acetaminophen) or Advil (ibuprofen) which contain no narcotic agents, and might be tolerated a little better, with fewer side effects. If the pain medication you are sent home with does not control the pain, you will have to let us know. Some narcotic pain medications cannot be given or refilled by a phone call to a pharmacy.  Problems you should report to [place]:  Fever of 101.0 degrees Fahrenheit or greater. Moderate or severe swelling under the skin incision or involving the scrotum. Drug reaction such as hives, a rash, nausea or vomiting.  Difficulty voiding

## 2023-07-27 NOTE — Transfer of Care (Signed)
 Immediate Anesthesia Transfer of Care Note  Patient: Steven Brady  Procedure(s) Performed: CIRCUMCISION, ADULT  Patient Location: PACU  Anesthesia Type:General  Level of Consciousness: drowsy  Airway & Oxygen Therapy: Patient Spontanous Breathing and Patient connected to face mask oxygen  Post-op Assessment: Report given to RN and Post -op Vital signs reviewed and stable  Post vital signs: Reviewed and stable  Last Vitals:  Vitals Value Taken Time  BP 150/99 07/27/23 1245  Temp    Pulse 65 07/27/23 1245  Resp 12 07/27/23 1245  SpO2 100 % 07/27/23 1245  Vitals shown include unfiled device data.  Last Pain:  Vitals:   07/27/23 1029  TempSrc: Oral  PainSc:          Complications: No notable events documented.

## 2023-07-27 NOTE — Anesthesia Procedure Notes (Signed)
 Procedure Name: LMA Insertion Date/Time: 07/27/2023 11:37 AM  Performed by: Vella Gey, CRNAPatient Re-evaluated:Patient Re-evaluated prior to induction Oxygen Delivery Method: Circle system utilized Preoxygenation: Pre-oxygenation with 100% oxygen Induction Type: IV induction LMA: LMA inserted LMA Size: 4.0 Number of attempts: 1 Placement Confirmation: positive ETCO2 and breath sounds checked- equal and bilateral Tube secured with: Tape Dental Injury: Teeth and Oropharynx as per pre-operative assessment

## 2023-07-27 NOTE — Anesthesia Postprocedure Evaluation (Signed)
 Anesthesia Post Note  Patient: Zailen Statzer  Procedure(s) Performed: CIRCUMCISION, ADULT     Patient location during evaluation: PACU Anesthesia Type: General Level of consciousness: awake and alert Pain management: pain level controlled Vital Signs Assessment: post-procedure vital signs reviewed and stable Respiratory status: spontaneous breathing, nonlabored ventilation and respiratory function stable Cardiovascular status: blood pressure returned to baseline and stable Postop Assessment: no apparent nausea or vomiting Anesthetic complications: no   No notable events documented.  Last Vitals:  Vitals:   07/27/23 1300 07/27/23 1315  BP: (!) 139/94 (!) 141/86  Pulse: 64 66  Resp: 15 16  Temp:    SpO2: 95% 94%    Last Pain:  Vitals:   07/27/23 1315  TempSrc:   PainSc: 0-No pain                 Earvin Goldberg

## 2023-07-27 NOTE — H&P (Signed)
 H&P  History of Present Illness: Steven Brady is a 68 y.o. year old M who presents today for circumcision  No acute complaints  Past Medical History:  Diagnosis Date   AICD (automatic cardioverter/defibrillator) present    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   recently hurt left ankle - 2 weeks ago   Arthritis    CAD (coronary artery disease)    CHF (congestive heart failure) (HCC) 03/23/2013   Chronic HFrEF (heart failure with reduced ejection fraction) (HCC)    Chronic kidney disease, stage 3 (HCC)    Diabetes mellitus without complication (HCC)    Hematuria - cause not known 06/08/2011   pt has been seeing blood off and on in urine   Hyperlipidemia Dx 2015   Hypertension Dx 2015   LBBB (left bundle branch block)    Stroke (HCC)    Thrombocytopenia (HCC)    Tobacco abuse    since age of 70    Past Surgical History:  Procedure Laterality Date   BIV ICD INSERTION CRT-D N/A 12/31/2022   Procedure: BIV ICD INSERTION CRT-D;  Surgeon: Efraim Grange, MD;  [facility];  Service: Cardiovascular;  Laterality: N/A;   CATARACT EXTRACTION, BILATERAL     TONSILLECTOMY      Home Medications:  Current Meds  Medication Sig   apixaban  (ELIQUIS ) 5 MG TABS tablet Take 5 mg by mouth 2 (two) times daily.   atorvastatin  (LIPITOR) 80 MG tablet TAKE 1 TABLET BY MOUTH EVERY DAY   carvedilol  (COREG ) 25 MG tablet Take 1 tablet (25 mg total) by mouth 2 (two) times daily.   Cholecalciferol (VITAMIN D3) 2000 UNITS TABS Take 2,000 Units by mouth daily.   clopidogrel  (PLAVIX ) 75 MG tablet Take 1 tablet (75 mg total) by mouth in the morning.   dapagliflozin  propanediol (FARXIGA ) 10 MG TABS tablet Take 1 tablet (10 mg total) by mouth daily.   Evolocumab  (REPATHA  SURECLICK) 140 MG/ML SOAJ Inject 140 mg into the skin every 14 (fourteen) days.   fluticasone  (FLONASE ) 50 MCG/ACT nasal spray Place 1 spray into both nostrils daily as needed for allergies.   furosemide  (LASIX ) 20 MG tablet  TAKE 1 TABLET (20 MG TOTAL) BY MOUTH AS NEEDED FOR FLUID OR EDEMA.   hydrALAZINE  (APRESOLINE ) 25 MG tablet Take 1 tablet (25 mg total) by mouth 3 (three) times daily. (Patient taking differently: Take 25 mg by mouth in the morning and at bedtime.)   isosorbide  mononitrate (IMDUR ) 30 MG 24 hr tablet Take 30 mg by mouth daily.   metFORMIN (GLUCOPHAGE) 500 MG tablet Take 500 mg by mouth 2 (two) times daily with a meal.   Multiple Vitamin (MULTIVITAMIN WITH MINERALS) TABS tablet Take 1 tablet by mouth in the morning. Centrum   nitroGLYCERIN  (NITROSTAT ) 0.3 MG SL tablet Place 1 tablet (0.3 mg total) under the tongue every 5 (five) minutes as needed for chest pain (Only take medication if you have chest pain. Please go to an emergency department if you have chest pain).   sacubitril-valsartan (ENTRESTO) 97-103 MG Take 1 tablet by mouth 2 (two) times daily.   spironolactone (ALDACTONE) 25 MG tablet Take 25 mg by mouth in the morning.   tiZANidine (ZANAFLEX) 4 MG tablet Take 4 mg by mouth every 8 (eight) hours as needed for muscle spasms.    Allergies: No Known Allergies  Family History  Problem Relation Age of Onset   Dementia Mother    Dementia Father     Social History:  reports  that he has quit smoking. His smoking use included cigarettes. He has never used smokeless tobacco. He reports current alcohol use. He reports current drug use. Drug: Marijuana.  ROS: A complete review of systems was performed.  All systems are negative except for pertinent findings as noted.  Physical Exam:  Vital signs in last 24 hours: Temp:  [98.1 F (36.7 C)] 98.1 F (36.7 C) (05/06 1029) Pulse Rate:  [68] 68 (05/06 1029) Resp:  [16] 16 (05/06 1029) BP: (150)/(87) 150/87 (05/06 1029) SpO2:  [96 %] 96 % (05/06 1029) Weight:  [89.8 kg] 89.8 kg (05/06 1026) Constitutional:  Alert and oriented, No acute distress Cardiovascular: Regular rate and rhythm Respiratory: Normal respiratory effort, Lungs clear  bilaterally GI: Abdomen is soft, nontender, nondistended, no abdominal masses Lymphatic: No lymphadenopathy Neurologic: Grossly intact, no focal deficits Psychiatric: Normal mood and affect   Laboratory Data:  No results for input(s): "WBC", "HGB", "HCT", "PLT" in the last 72 hours.  No results for input(s): "NA", "K", "CL", "GLUCOSE", "BUN", "CALCIUM ", "CREATININE" in the last 72 hours.  Invalid input(s): "CO3"   Results for orders placed or performed during the hospital encounter of 07/27/23 (from the past 24 hours)  Glucose, capillary     Status: Abnormal   Collection Time: 07/27/23 10:25 AM  Result Value Ref Range   Glucose-Capillary 131 (H) 70 - 99 mg/dL   No results found for this or any previous visit (from the past 240 hours).  Renal Function: No results for input(s): "CREATININE" in the last 168 hours. CrCl cannot be calculated (Patient's most recent lab result is older than the maximum 21 days allowed.).  Radiologic Imaging: No results found.  Assessment:  Steven Brady is a 68 y.o. year old M here today for circumcision  Plan:  To OR as planned. Procedure and risks reviewed (including but not limited to wound healing issues, bleeding, infection, pain, altered penile sensation.)   Julene Oaks, MD 07/27/2023, 11:02 AM  [facility] Pager: [phone]

## 2023-07-28 ENCOUNTER — Encounter (HOSPITAL_COMMUNITY): Payer: Self-pay | Admitting: Urology

## 2023-08-06 ENCOUNTER — Telehealth: Payer: Self-pay

## 2023-08-06 NOTE — Telephone Encounter (Signed)
   Pre-operative Risk Assessment    Patient Name: Steven Brady  DOB: [birth year] MRN: [id number]   Date of last office visit: 06/01/23 with Dr. Alwin Baars Date of next office visit: 08/26/23 with Dr. Alwin Baars   Request for Surgical Clearance    Procedure:  Cervical Anterior Discectomy with fusion 4-7   Date of Surgery:  Clearance 09/09/23                                 Surgeon:  Dr. Glennon Lao  Surgeon's Group or Practice Name:  [facility] Phone number:  [phone] Fax number:  [phone]   Type of Clearance Requested:   - Medical  - Pharmacy:  Hold Apixaban  (Eliquis ) and Plavix  not indicated   Type of Anesthesia:  General    Additional requests/questions:    [name]   08/06/2023, 2:54 PM

## 2023-08-13 NOTE — Telephone Encounter (Signed)
   Name: Steven Brady  DOB: 1955/03/29  MRN: [id number]  Primary Cardiologist: Antoinette Batman, MD   Preoperative team, please contact this patient and set up a phone call appointment for further preoperative risk assessment. Please obtain consent and complete medication review. Thank you for your help.  I confirm that guidance regarding antiplatelet and oral anticoagulation therapy has been completed and, if necessary, noted below.  Patient can hold Plavix  5 days prior to procedure Per office protocol, patient can hold Eliquis  for 3 days prior to procedure.    I also confirmed the patient resides in the state of [place] . As per [facility] Medical Board telemedicine laws, the patient must reside in the state in which the provider is licensed.   Francene Ing, Retha Cast, NP 08/13/2023, 1:53 PM [facility]

## 2023-08-13 NOTE — Progress Notes (Signed)
 Remote ICD transmission.

## 2023-08-13 NOTE — Telephone Encounter (Signed)
 Tried calling patient to schedule televisit no answer left a detailed message to call back and schedule

## 2023-08-13 NOTE — Addendum Note (Signed)
 Addended by: Lott Rouleau A on: 08/13/2023 08:33 AM   Modules accepted: Orders

## 2023-08-13 NOTE — Telephone Encounter (Signed)
 Patient with diagnosis of prior CVA on Eliquis  for anticoagulation.    Procedure: Cervical Anterior Discectomy with fusion 4-7  Date of procedure: 09/09/2023    CrCl 54 mL/min Platelet count 143   Per office protocol, patient can hold Eliquis  for 3 days prior to procedure.     **This guidance is not considered finalized until pre-operative APP has relayed final recommendations.**

## 2023-08-18 ENCOUNTER — Telehealth: Payer: Self-pay

## 2023-08-18 NOTE — Telephone Encounter (Signed)
 Med Rec and consent done     Patient Consent for Virtual Visit        Steven Brady has provided verbal consent on 08/18/2023 for a virtual visit (video or telephone).   CONSENT FOR VIRTUAL VISIT FOR:  Steven Brady  By participating in this virtual visit I agree to the following:  I hereby voluntarily request, consent and authorize [facility] and its employed or contracted physicians, physician assistants, nurse practitioners or other licensed health care professionals (the Practitioner), to provide me with telemedicine health care services (the "Services") as deemed necessary by the treating Practitioner. I acknowledge and consent to receive the Services by the Practitioner via telemedicine. I understand that the telemedicine visit will involve communicating with the Practitioner through live audiovisual communication technology and the disclosure of certain medical information by electronic transmission. I acknowledge that I have been given the opportunity to request an in-person assessment or other available alternative prior to the telemedicine visit and am voluntarily participating in the telemedicine visit.  I understand that I have the right to withhold or withdraw my consent to the use of telemedicine in the course of my care at any time, without affecting my right to future care or treatment, and that the Practitioner or I may terminate the telemedicine visit at any time. I understand that I have the right to inspect all information obtained and/or recorded in the course of the telemedicine visit and may receive copies of available information for a reasonable fee.  I understand that some of the potential risks of receiving the Services via telemedicine include:  Delay or interruption in medical evaluation due to technological equipment failure or disruption; Information transmitted may not be sufficient (e.g. poor resolution of images) to allow for appropriate medical  decision making by the Practitioner; and/or  In rare instances, security protocols could fail, causing a breach of personal health information.  Furthermore, I acknowledge that it is my responsibility to provide information about my medical history, conditions and care that is complete and accurate to the best of my ability. I acknowledge that Practitioner's advice, recommendations, and/or decision may be based on factors not within their control, such as incomplete or inaccurate data provided by me or distortions of diagnostic images or specimens that may result from electronic transmissions. I understand that the practice of medicine is not an exact science and that Practitioner makes no warranties or guarantees regarding treatment outcomes. I acknowledge that a copy of this consent can be made available to me via my patient portal [facility] MyChart), or I can request a printed copy by calling the office of [facility].    I understand that my insurance will be billed for this visit.   I have read or had this consent read to me. I understand the contents of this consent, which adequately explains the benefits and risks of the Services being provided via telemedicine.  I have been provided ample opportunity to ask questions regarding this consent and the Services and have had my questions answered to my satisfaction. I give my informed consent for the services to be provided through the use of telemedicine in my medical care

## 2023-08-18 NOTE — Telephone Encounter (Addendum)
 S/W Emergency Contact Elin Guan and got TELE Preop Appt for 08/26/23.. Med Rec and consent done

## 2023-08-18 NOTE — Telephone Encounter (Signed)
 LVM asking pt to call our office to schedule virtual visit for preop clearance. Please reach out to preop team if pt returns call. Thank you.

## 2023-08-18 NOTE — Telephone Encounter (Signed)
 T'Keyh returning call to schedule on behalf of patient.

## 2023-08-25 ENCOUNTER — Telehealth (HOSPITAL_COMMUNITY): Payer: Self-pay | Admitting: Cardiology

## 2023-08-25 NOTE — Telephone Encounter (Signed)
 Called to confirm/remind patient of their appointment at the [facility] on 06/25 .   Appointment:   [x] Confirmed  [] Left mess   [] No answer/No voice mail  [] VM Full/unable to leave message  [] Phone not in service  Patient reminded to bring all medications and/or complete list.  Confirmed patient has transportation. Gave directions, instructed to utilize valet parking.

## 2023-08-25 NOTE — Progress Notes (Signed)
 PERIOPERATIVE PRESCRIPTION FOR IMPLANTED CARDIAC DEVICE PROGRAMMING   Patient Information:  Patient: Steven Brady  MRN: [id number]  Date of Birth: 05/14/1955   Procedure:  Cervical Anterior Discectomy with fusion 4-7    Date of Surgery:  Clearance 09/09/23                                  Surgeon:  Dr. Glennon Lao  Surgeon's Group or Practice Name:  [facility] Phone number:  [phone] Fax number:  [phone]    Device Information:   Clinic EP Physician:   Dr. Marlane Silver Device Type:  Defibrillator Manufacturer and Phone #:  [employer]: [phone] Pacemaker Dependent?:  No Date of Last Device Check:  06/30/2023        Normal Device Function?:  Yes     Electrophysiologist's Recommendations:   Have magnet available. Provide continuous ECG monitoring when magnet is used or reprogramming is to be performed.  Procedure will likely interfere with device function.  Device should be programmed:  Tachy therapies disabled  Per Device Clinic Standing Orders, Glorianne Largo  08/25/2023 12:35 PM

## 2023-08-26 ENCOUNTER — Ambulatory Visit: Attending: Cardiology | Admitting: Emergency Medicine

## 2023-08-26 ENCOUNTER — Encounter (HOSPITAL_COMMUNITY): Payer: Self-pay | Admitting: Cardiology

## 2023-08-26 ENCOUNTER — Ambulatory Visit (HOSPITAL_COMMUNITY)
Admission: RE | Admit: 2023-08-26 | Discharge: 2023-08-26 | Disposition: A | Discharge status: Home / Self Care | Source: Ambulatory Visit | Attending: Cardiology | Admitting: Cardiology

## 2023-08-26 VITALS — BP 122/70 | HR 60 | Wt 206.0 lb

## 2023-08-26 DIAGNOSIS — I5020 Unspecified systolic (congestive) heart failure: Secondary | ICD-10-CM | POA: Diagnosis present

## 2023-08-26 DIAGNOSIS — I2510 Atherosclerotic heart disease of native coronary artery without angina pectoris: Secondary | ICD-10-CM

## 2023-08-26 DIAGNOSIS — Z8673 Personal history of transient ischemic attack (TIA), and cerebral infarction without residual deficits: Secondary | ICD-10-CM | POA: Insufficient documentation

## 2023-08-26 DIAGNOSIS — E861 Hypovolemia: Secondary | ICD-10-CM | POA: Insufficient documentation

## 2023-08-26 DIAGNOSIS — R0601 Orthopnea: Secondary | ICD-10-CM | POA: Insufficient documentation

## 2023-08-26 DIAGNOSIS — E785 Hyperlipidemia, unspecified: Secondary | ICD-10-CM | POA: Insufficient documentation

## 2023-08-26 DIAGNOSIS — Z955 Presence of coronary angioplasty implant and graft: Secondary | ICD-10-CM | POA: Diagnosis not present

## 2023-08-26 DIAGNOSIS — E782 Mixed hyperlipidemia: Secondary | ICD-10-CM

## 2023-08-26 DIAGNOSIS — N1831 Chronic kidney disease, stage 3a: Secondary | ICD-10-CM

## 2023-08-26 DIAGNOSIS — Z01810 Encounter for preprocedural cardiovascular examination: Secondary | ICD-10-CM

## 2023-08-26 DIAGNOSIS — I110 Hypertensive heart disease with heart failure: Secondary | ICD-10-CM | POA: Insufficient documentation

## 2023-08-26 DIAGNOSIS — I255 Ischemic cardiomyopathy: Secondary | ICD-10-CM

## 2023-08-26 DIAGNOSIS — Z79899 Other long term (current) drug therapy: Secondary | ICD-10-CM | POA: Insufficient documentation

## 2023-08-26 DIAGNOSIS — E1142 Type 2 diabetes mellitus with diabetic polyneuropathy: Secondary | ICD-10-CM | POA: Diagnosis not present

## 2023-08-26 DIAGNOSIS — N183 Chronic kidney disease, stage 3 unspecified: Secondary | ICD-10-CM | POA: Diagnosis not present

## 2023-08-26 DIAGNOSIS — E1122 Type 2 diabetes mellitus with diabetic chronic kidney disease: Secondary | ICD-10-CM | POA: Diagnosis not present

## 2023-08-26 DIAGNOSIS — I5022 Chronic systolic (congestive) heart failure: Secondary | ICD-10-CM | POA: Diagnosis not present

## 2023-08-26 DIAGNOSIS — I639 Cerebral infarction, unspecified: Secondary | ICD-10-CM | POA: Diagnosis not present

## 2023-08-26 DIAGNOSIS — Z7902 Long term (current) use of antithrombotics/antiplatelets: Secondary | ICD-10-CM | POA: Diagnosis not present

## 2023-08-26 DIAGNOSIS — Z7901 Long term (current) use of anticoagulants: Secondary | ICD-10-CM | POA: Diagnosis not present

## 2023-08-26 DIAGNOSIS — Z7984 Long term (current) use of oral hypoglycemic drugs: Secondary | ICD-10-CM | POA: Insufficient documentation

## 2023-08-26 DIAGNOSIS — Z95810 Presence of automatic (implantable) cardiac defibrillator: Secondary | ICD-10-CM

## 2023-08-26 LAB — ECHOCARDIOGRAM COMPLETE
Area-P 1/2: 2.66 cm2
Calc EF: 52.5 %
S' Lateral: 3 cm
Single Plane A2C EF: 53.8 %
Single Plane A4C EF: 52.4 %

## 2023-08-26 NOTE — Patient Instructions (Addendum)
 CONGRATULATIONS YOU HAVE GRADUATED FROM THE HEART FAILURE CLINIC.  STOP Eliquis .  Please call Dr. Jeneal Mins office to schedule a follow up with him.  At the [facility], you and your health needs are our priority. As part of our continuing mission to provide you with exceptional heart care, we have created designated Provider Care Teams. These Care Teams include your primary Cardiologist (physician) and Advanced Practice Providers (APPs- Physician Assistants and Nurse Practitioners) who all work together to provide you with the care you need, when you need it.   You may see any of the following providers on your designated Care Team at your next follow up: Dr Jules Oar Dr Peder Bourdon Dr. Alwin Baars Dr. Arta Lark Amy Marijane Shoulders, NP Ruddy Corral, [place] [facility] [place], [place] Dennise Fitz, NP [place] Lee, NP Shawnee Dellen, NP Luster Salters, PharmD Bevely Brush, PharmD   Please be sure to bring in all your medications bottles to every appointment.    Thank you for choosing [facility]

## 2023-08-26 NOTE — Progress Notes (Addendum)
 [facility] NOTE  Referring Physician: Nita Bast, NP  Primary Care: Nita Bast, NP Primary Cardiologist:  CC: Chronic systolic heart failure  HPI: Steven Brady is a 68 y.o. male with HFrEF, CAD, cardioembolic stroke in 2023, CKD 3, left bundle branch block, CAD status post PCI in 2024 presenting today for follow up.   He was previously followed at [facility] for chronic HFrEF for at least 10 years and stroke in 2022 with EF as low as 10%.  Earlier this year he had a workup for CAD at [facility] where coronary CT showed multivessel CAD and cardiac MRI demonstrating scar in the inferior wall.  He ultimately underwent left heart cath in April 2024 and is now status post PCI to the RCA due to positive FFR.  He presented to [facility] in July 2024 for chest pain.  Echocardiogram at this time showed an EF of 25 to 30% with moderate LVH.  He was started on GDMT and discharged home.  Since that time he has also been seen by electrophysiology with plan for BiV upgrade.  Interval hx:  - Feels very well from a HFrEF standpoint; LV function has recovered by echocardiogram today.   Activity level/exercise tolerance:  NYHA II Orthopnea:  Sleeps on 2 pillows Paroxysmal noctural dyspnea:  No Chest pain/pressure:  no Orthostatic lightheadedness:  no Palpitations:  no Lower extremity edema:  no Presyncope/syncope:  no Cough:  no  Current Outpatient Medications  Medication Sig Dispense Refill   acetaminophen  (TYLENOL ) 500 MG tablet Take 1,000 mg by mouth as needed.     apixaban  (ELIQUIS ) 5 MG TABS tablet Take 5 mg by mouth 2 (two) times daily.     atorvastatin  (LIPITOR) 80 MG tablet TAKE 1 TABLET BY MOUTH EVERY DAY 30 tablet 0   carvedilol  (COREG ) 25 MG tablet Take 1 tablet (25 mg total) by mouth 2 (two) times daily. 180 tablet 3   Cholecalciferol (VITAMIN D3) 2000 UNITS TABS Take 2,000 Units by mouth daily. 30 tablet 11   clopidogrel  (PLAVIX ) 75 MG tablet Take  1 tablet (75 mg total) by mouth in the morning. 30 tablet 5   dapagliflozin  propanediol (FARXIGA ) 10 MG TABS tablet Take 1 tablet (10 mg total) by mouth daily. 60 tablet 11   Evolocumab  (REPATHA  SURECLICK) 140 MG/ML SOAJ Inject 140 mg into the skin every 14 (fourteen) days. 6 mL 3   fluticasone  (FLONASE ) 50 MCG/ACT nasal spray Place 1 spray into both nostrils daily as needed for allergies.     furosemide  (LASIX ) 20 MG tablet TAKE 1 TABLET (20 MG TOTAL) BY MOUTH AS NEEDED FOR FLUID OR EDEMA. 90 tablet 0   hydrALAZINE  (APRESOLINE ) 25 MG tablet Take 1 tablet (25 mg total) by mouth 3 (three) times daily. 90 tablet 11   isosorbide  mononitrate (IMDUR ) 30 MG 24 hr tablet Take 30 mg by mouth daily.     metFORMIN (GLUCOPHAGE) 500 MG tablet Take 500 mg by mouth 2 (two) times daily with a meal.     Multiple Vitamin (MULTIVITAMIN WITH MINERALS) TABS tablet Take 1 tablet by mouth in the morning. Centrum     nitroGLYCERIN  (NITROSTAT ) 0.3 MG SL tablet Place 1 tablet (0.3 mg total) under the tongue every 5 (five) minutes as needed for chest pain (Only take medication if you have chest pain. Please go to an emergency department if you have chest pain). 10 tablet 0   sacubitril-valsartan (ENTRESTO) 97-103 MG Take 1 tablet by mouth 2 (two)  times daily.     spironolactone (ALDACTONE) 25 MG tablet Take 25 mg by mouth in the morning.     No current facility-administered medications for this encounter.    No Known Allergies    PHYSICAL EXAM: Vitals:   08/26/23 1341  BP: 122/70  Pulse: 60  SpO2: 98%   GENERAL: NAD Lungs- CTA CARDIAC:  JVP: 6 cm          Normal rate with regular rhythm. no murmur.  Pulses 2+. no edema.  ABDOMEN: Soft, non-tender, non-distended.  EXTREMITIES: Warm and well perfused.  NEUROLOGIC: No obvious FND   DATA REVIEW  ECG: 09/25/22: NSR w/ LBBB  As per my personal interpretation  ECHO: 09/26/22: LVEF 25-30% w/ prominent IVS dyssynchrony due to LBBB as per my personal  interpretation 03/03/23: LVEF 30%, normal RV function personally reviewed and interpreted 05/14/23: LVEF 35%-40%, normal RV function, personally reviewed.  08/25/24: LVEF 55%, normal RV function.   CATH: 07/13/22: Left Anterior Descending: The vessel exhibits minimal luminal irregularities.  Left Circumflex: The vessel exhibits minimal luminal irregularities. Dist Cx lesion is 70% stenosed. Third Obtuse Marginal Branch: 3rd Mrg lesion is 35% stenosed.  Right Coronary Artery: Ost RCA to Prox RCA lesion is 30% stenosed. Prox RCA lesion is 70% stenosed. Lesion length: 13 mm. TIMI flow is 3. The lesion is not complex (non high-C). Mid RCA lesion is 70% stenosed. Lesion length: 15 mm. TIMI flow is 3. The lesion is not complex (non high-C). Dist RCA lesion is 70% stenosed. Lesion length: 20 mm. TIMI flow is 3. The lesion is not complex (non high-C). Pressure wire/FFR was performed on the lesion. FFR: 0.79. Right Posterior Descending Artery: RPDA lesion is 30% stenosed.  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  Status post PCI x 3 to the RCA.  Diffuse reduction in LVEF however is consistent with a nonischemic process. NYHA class / AHA Stage:IIB Volume status & Diuretics:  Euvolemic on exam; lasix  PRN only.  Vasodilators: Entresto 97/93 mg twice daily, decrease hydralazine  to 25mg  TID due to lightheadedness, continue imdur  30mg  daily. Repeat BMP/BNP today. If BP remains well controlled, can wean off hydralazine /imdur  in the future Beta-Blocker: continue coreg  25mg  BID  MRA: Spironolactone 25 mg daily Cardiometabolic: Farxiga  10 mg daily; reports he ran out today. Will refill .  Devices therapies & Valvulopathies: BIV device placed on 12/31/22. Device interrogation with BiV pacing > 99%.  Advanced therapies: Will continue follow-up.  Currently not a candidate.  2.  Coronary  artery disease -Status post PCI to the RCA x 3.  Left heart cath noted above. -no chest pain  3.  History of cardioembolic stroke -Currently on Eliquis  and Plavix . -Recovery of LV function on echocardiogram today. Will discontinue apixaban .   4. Hyperlipidemia - LDL remains above goal - On repatha  currently.   5. T2DM - Currently taking metformin 500mg  BID - A1C 7.1 on 04/01/23  6. Peripheral neuropathy - A1C 7.1; suspect it is diabetic in nature. No signs of amyloid on TTE.   7. CKD - Reviewed labs from 08/25/23. Scr up to 2 from 1.4-1.6. Discussed with patient. Appears hypovolemic on exam. He will decrease his PRN use of lasix .   I spent 35 minutes caring for this patient today including face to face time, ordering and reviewing labs, reviewing echocardiogram with him, device interrogation, seeing the patient, documenting in  the record, and arranging follow ups.  Graduate from [facility] clinic. LVEF has recovered.   Aritha Huckeba Advanced Heart Failure Mechanical Circulatory Support

## 2023-08-26 NOTE — Progress Notes (Signed)
 Virtual Visit via Telephone Note   Because of Steven Brady co-morbid illnesses, he is at least at moderate risk for complications without adequate follow up.  This format is felt to be most appropriate for this patient at this time.  Due to technical limitations with video connection (technology), today's appointment will be conducted as an audio only telehealth visit, and Steven Brady verbally agreed to proceed in this manner.   All issues noted in this document were discussed and addressed.  No physical exam could be performed with this format.  Evaluation Performed:  Preoperative cardiovascular risk assessment _____________   Date:  08/26/2023   Patient ID:  Steven Brady, DOB [birth year], MRN [id number] Patient Location:  Home Provider location:   Office  Primary Care Provider:  Nita Bast, NP Primary Cardiologist:  Antoinette Batman, MD  Chief Complaint / Patient Profile   68 y.o. y/o male with a h/o hypertension, coronary artery disease, hyperlipidemia who is pending cervical anterior discectomy with fusion 4-7 with [facility] on 09/09/2023 and presents today for telephonic preoperative cardiovascular risk assessment.  History of Present Illness    Steven Brady is a 68 y.o. male who presents via audio/video conferencing for a telehealth visit today.  Pt was last seen in cardiology clinic on 06/30/2023 by Gerilyn Kobus, PA.  At that time Junious Ragone was doing well.  The patient is now pending procedure as outlined above. Since his last visit, he  denies chest pain, shortness of breath, lower extremity edema, fatigue, palpitations, melena, hematuria, hemoptysis, diaphoresis, weakness, presyncope, syncope, orthopnea, and PND.  Today patient notes he is doing well overall.  He is without any acute cardiovascular concerns or complaints.  He denies any chest pains or shortness of breath.  He notes that his fluid status/weight has been stable.  He tells me that he  is fairly active without any exertional symptoms.  He walks daily, can walk up stairs with ease, does housework with no problems.  He is easily able to complete greater than 4 METS.  Past Medical History    Past Medical History:  Diagnosis Date   AICD (automatic cardioverter/defibrillator) present    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   recently hurt left ankle - 2 weeks ago   Arthritis    CAD (coronary artery disease)    CHF (congestive heart failure) (HCC) 03/23/2013   Chronic HFrEF (heart failure with reduced ejection fraction) (HCC)    Chronic kidney disease, stage 3 (HCC)    Diabetes mellitus without complication (HCC)    Hematuria - cause not known 06/08/2011   pt has been seeing blood off and on in urine   Hyperlipidemia Dx 2015   Hypertension Dx 2015   LBBB (left bundle branch block)    Stroke (HCC)    Thrombocytopenia (HCC)    Tobacco abuse    since age of 16   Past Surgical History:  Procedure Laterality Date   BIV ICD INSERTION CRT-D N/A 12/31/2022   Procedure: BIV ICD INSERTION CRT-D;  Surgeon: Efraim Grange, MD;  Location: [facility] INVASIVE CV LAB;  Service: Cardiovascular;  Laterality: N/A;   CATARACT EXTRACTION, BILATERAL     CIRCUMCISION N/A 07/27/2023   Procedure: CIRCUMCISION, ADULT;  Surgeon: Mallie Seal, MD;  Location: [facility] ORS;  Service: Urology;  Laterality: N/A;   TONSILLECTOMY      Allergies  No Known Allergies  Home Medications    Prior to Admission medications   Medication Sig Start Date End Date Taking? Authorizing Provider  acetaminophen  (TYLENOL ) 500 MG tablet Take 2 tablets (1,000 mg total) by mouth every 6 (six) hours. 07/27/23   Mallie Seal, MD  apixaban  (ELIQUIS ) 5 MG TABS tablet Take 5 mg by mouth 2 (two) times daily.    [provider]  atorvastatin  (LIPITOR) 80 MG tablet TAKE 1 TABLET BY MOUTH EVERY DAY 11/12/22   Aurora Lees, DO  carvedilol  (COREG ) 25 MG tablet Take 1 tablet (25 mg total) by mouth 2 (two) times daily. 06/01/23    Sabharwal, Aditya, DO  Cholecalciferol (VITAMIN D3) 2000 UNITS TABS Take 2,000 Units by mouth daily. 07/31/14   Funches, Josalyn, MD  clopidogrel  (PLAVIX ) 75 MG tablet Take 1 tablet (75 mg total) by mouth in the morning. 06/30/23   Arleene Belt, PA-C  dapagliflozin  propanediol (FARXIGA ) 10 MG TABS tablet Take 1 tablet (10 mg total) by mouth daily. 07/06/23   Arleene Belt, PA-C  Evolocumab  (REPATHA  SURECLICK) 140 MG/ML SOAJ Inject 140 mg into the skin every 14 (fourteen) days. 01/12/23   Sabharwal, Aditya, DO  fluticasone  (FLONASE ) 50 MCG/ACT nasal spray Place 1 spray into both nostrils daily as needed for allergies.    [provider]  furosemide  (LASIX ) 20 MG tablet TAKE 1 TABLET (20 MG TOTAL) BY MOUTH AS NEEDED FOR FLUID OR EDEMA. 06/29/23   Bensimhon, Rheta Celestine, MD  hydrALAZINE  (APRESOLINE ) 25 MG tablet Take 1 tablet (25 mg total) by mouth 3 (three) times daily. Patient taking differently: Take 25 mg by mouth in the morning and at bedtime. 03/03/23   Sabharwal, Aditya, DO  isosorbide  mononitrate (IMDUR ) 30 MG 24 hr tablet Take 30 mg by mouth daily.    [provider]  metFORMIN (GLUCOPHAGE) 500 MG tablet Take 500 mg by mouth 2 (two) times daily with a meal. 09/08/22   [provider]  Multiple Vitamin (MULTIVITAMIN WITH MINERALS) TABS tablet Take 1 tablet by mouth in the morning. Centrum    [provider]  nitroGLYCERIN  (NITROSTAT ) 0.3 MG SL tablet Place 1 tablet (0.3 mg total) under the tongue every 5 (five) minutes as needed for chest pain (Only take medication if you have chest pain. Please go to an emergency department if you have chest pain). 09/26/22 09/26/23  Aurora Lees, DO  sacubitril-valsartan (ENTRESTO) 97-103 MG Take 1 tablet by mouth 2 (two) times daily.    [provider]  spironolactone (ALDACTONE) 25 MG tablet Take 25 mg by mouth in the morning.    [provider]    Physical Exam    Vital Signs:  Hashir Deleeuw  does not have vital signs available for review today.  Given telephonic nature of communication, physical exam is limited. AAOx3. NAD. Normal affect.  Speech and respirations are unlabored.  Accessory Clinical Findings    None  Assessment & Plan    1.  Preoperative Cardiovascular Risk Assessment: According to the Revised Cardiac Risk Index (RCRI), his Perioperative Risk of Major Cardiac Event is (%): 11. His Functional Capacity in METs is: 5.72 according to the Duke Activity Status Index (DASI). Therefore, based on ACC/AHA guidelines, patient would be at acceptable risk for the planned procedure without further cardiovascular testing.   The patient was advised that if he develops new symptoms prior to surgery to contact our office to arrange for a follow-up visit, and he verbalized understanding.  Per office protocol, patient can hold Eliquis  for 3 days prior to procedure. Please resume Eliquis  as soon as possible postprocedure, at the discretion of the surgeon.  He may hold Plavix  for 5 days prior to procedure. Please resume Plavix  as soon as possible postprocedure, at the discretion of the surgeon.      A copy of this note will be routed to requesting surgeon.  Time:   Today, I have spent 7 minutes with the patient with telehealth technology discussing medical history, symptoms, and management plan.     Ava Boatman, NP  08/26/2023, 9:55 AM

## 2023-08-31 ENCOUNTER — Telehealth: Payer: Self-pay

## 2023-08-31 NOTE — Telephone Encounter (Signed)
   Pre-operative Risk Assessment    Patient Name: Steven Brady  DOB: 1955-08-29 MRN: [id number]   Date of last office visit: 08/26/23 Date of next office visit: n/a   Request for Surgical Clearance    Procedure:  Bilateral L3/L4, L4/L5 radiofrequency ablation   Date of Surgery:  Clearance 09/02/23                                 Surgeon:  Galvin Jules  Surgeon's Group or Practice Name:  [facility]  Phone number:  [phone] Fax number:  [phone]   Type of Clearance Requested:   - Medical  - Pharmacy:  Hold Clopidogrel  (Plavix ) Not indicated    Type of Anesthesia:  Not Indicated   Additional requests/questions:    Mansfield Seip   08/31/2023, 3:07 PM

## 2023-09-01 NOTE — Telephone Encounter (Signed)
 I s/w requesting office and confirmed procedure for tomorrow is still planned. Per Dr. Rogelia Clarks CMA that their office was contacted yesterday by the surgery center and said they need a cardiac clearance as well.

## 2023-09-15 ENCOUNTER — Other Ambulatory Visit: Payer: Self-pay | Admitting: Family Medicine

## 2023-09-15 DIAGNOSIS — D696 Thrombocytopenia, unspecified: Secondary | ICD-10-CM

## 2023-09-15 DIAGNOSIS — Z122 Encounter for screening for malignant neoplasm of respiratory organs: Secondary | ICD-10-CM

## 2023-09-15 DIAGNOSIS — Z87891 Personal history of nicotine dependence: Secondary | ICD-10-CM

## 2023-09-22 ENCOUNTER — Ambulatory Visit
Admission: RE | Admit: 2023-09-22 | Discharge: 2023-09-22 | Disposition: A | Discharge status: Home / Self Care | Source: Ambulatory Visit | Attending: Family Medicine | Admitting: Family Medicine

## 2023-09-22 DIAGNOSIS — D696 Thrombocytopenia, unspecified: Secondary | ICD-10-CM

## 2023-09-30 ENCOUNTER — Ambulatory Visit: Payer: 59

## 2023-09-30 DIAGNOSIS — I255 Ischemic cardiomyopathy: Secondary | ICD-10-CM | POA: Diagnosis not present

## 2023-10-04 ENCOUNTER — Ambulatory Visit: Payer: Self-pay | Admitting: Cardiovascular Disease

## 2023-10-04 LAB — CUP PACEART REMOTE DEVICE CHECK
Battery Remaining Longevity: 82 mo
Battery Remaining Percentage: 86 %
Battery Voltage: 2.99 V
Brady Statistic AP VP Percent: 14 %
Brady Statistic AP VS Percent: 1 %
Brady Statistic AS VP Percent: 85 %
Brady Statistic AS VS Percent: 1 %
Brady Statistic RA Percent Paced: 14 %
Date Time Interrogation Session: 20250711152657
HighPow Impedance: 71 Ohm
Implantable Lead Connection Status: 753985
Implantable Lead Connection Status: 753985
Implantable Lead Connection Status: 753985
Implantable Lead Implant Date: 20241010
Implantable Lead Implant Date: 20241010
Implantable Lead Implant Date: 20241010
Implantable Lead Location: 753858
Implantable Lead Location: 753859
Implantable Lead Location: 753860
Implantable Pulse Generator Implant Date: 20241010
Lead Channel Impedance Value: 380 Ohm
Lead Channel Impedance Value: 430 Ohm
Lead Channel Impedance Value: 960 Ohm
Lead Channel Pacing Threshold Amplitude: 0.625 V
Lead Channel Pacing Threshold Amplitude: 0.625 V
Lead Channel Pacing Threshold Amplitude: 1.5 V
Lead Channel Pacing Threshold Pulse Width: 0.5 ms
Lead Channel Pacing Threshold Pulse Width: 0.5 ms
Lead Channel Pacing Threshold Pulse Width: 0.5 ms
Lead Channel Sensing Intrinsic Amplitude: 12 mV
Lead Channel Sensing Intrinsic Amplitude: 3.6 mV
Lead Channel Setting Pacing Amplitude: 1.125
Lead Channel Setting Pacing Amplitude: 1.625
Lead Channel Setting Pacing Amplitude: 2.5 V
Lead Channel Setting Pacing Pulse Width: 0.5 ms
Lead Channel Setting Pacing Pulse Width: 0.5 ms
Lead Channel Setting Sensing Sensitivity: 0.5 mV
Pulse Gen Serial Number: 211015510
Zone Setting Status: 755011

## 2023-10-06 ENCOUNTER — Other Ambulatory Visit

## 2023-10-13 ENCOUNTER — Ambulatory Visit
Admission: RE | Admit: 2023-10-13 | Discharge: 2023-10-13 | Disposition: A | Discharge status: Home / Self Care | Source: Ambulatory Visit | Attending: Family Medicine | Admitting: Family Medicine

## 2023-10-13 DIAGNOSIS — Z122 Encounter for screening for malignant neoplasm of respiratory organs: Secondary | ICD-10-CM

## 2023-10-13 DIAGNOSIS — Z87891 Personal history of nicotine dependence: Secondary | ICD-10-CM

## 2023-11-07 ENCOUNTER — Encounter (HOSPITAL_COMMUNITY): Payer: Self-pay

## 2023-11-07 ENCOUNTER — Ambulatory Visit (HOSPITAL_COMMUNITY)
Admission: RE | Admit: 2023-11-07 | Discharge: 2023-11-07 | Disposition: A | Discharge status: Home / Self Care | Source: Ambulatory Visit | Attending: Physician Assistant | Admitting: Physician Assistant

## 2023-11-07 VITALS — BP 102/70 | HR 79 | Temp 98.0°F | Resp 18 | Ht 67.0 in

## 2023-11-07 DIAGNOSIS — M79641 Pain in right hand: Secondary | ICD-10-CM

## 2023-11-07 MED ORDER — PREDNISONE 10 MG PO TABS: 20.0000 mg | ORAL_TABLET | Freq: Every day | ORAL | 0 refills | Status: AC

## 2023-11-07 NOTE — ED Provider Notes (Signed)
 [facility]    CSN: [id number] Arrival date & time: 11/07/23  1232      History   Chief Complaint Chief Complaint  Patient presents with   Appointment   Hand Pain    HPI Bowdy Bair is a 68 y.o. male.    Hand Pain Pertinent negatives include no shortness of breath.    Past Medical History:  Diagnosis Date   AICD (automatic cardioverter/defibrillator) present    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   recently hurt left ankle - 2 weeks ago   Arthritis    CAD (coronary artery disease)    CHF (congestive heart failure) (HCC) 03/23/2013   Chronic HFrEF (heart failure with reduced ejection fraction) (HCC)    Chronic kidney disease, stage 3 (HCC)    Diabetes mellitus without complication (HCC)    Hematuria - cause not known 06/08/2011   pt has been seeing blood off and on in urine   Hyperlipidemia Dx 2015   Hypertension Dx 2015   LBBB (left bundle branch block)    Stroke (HCC)    Thrombocytopenia (HCC)    Tobacco abuse    since age of 25    Patient Active Problem List   Diagnosis Date Noted   CVA (cerebral vascular accident) (HCC) 09/26/2022   Chest pain due to CAD (HCC) 09/25/2022   Vitamin D  insufficiency 07/31/2014   Screening for HIV (human immunodeficiency virus) 07/30/2014   Allergic rhinitis 07/30/2014   Tinnitus 07/30/2014   Excessive cerumen in right ear canal 07/30/2014   Vertigo    Multiple lacunar infarcts (HCC)    Tobacco abuse    Hyperlipidemia    Hypertension     Past Surgical History:  Procedure Laterality Date   BIV ICD INSERTION CRT-D N/A 12/31/2022   Procedure: BIV ICD INSERTION CRT-D;  Surgeon: Nancey Eulas BRAVO, MD;  Location: [facility];  Service: Cardiovascular;  Laterality: N/A;   CATARACT EXTRACTION, BILATERAL     CIRCUMCISION N/A 07/27/2023   Procedure: CIRCUMCISION, ADULT;  Surgeon: Lovie Arlyss CROME, MD;  Location: [facility];  Service: Urology;  Laterality: N/A;   TONSILLECTOMY         Home Medications    Prior  to Admission medications   Medication Sig Start Date End Date Taking? Authorizing Provider  acetaminophen  (TYLENOL ) 500 MG tablet Take 1,000 mg by mouth as needed.   Yes [provider]  atorvastatin  (LIPITOR) 80 MG tablet TAKE 1 TABLET BY MOUTH EVERY DAY 11/12/22  Yes Kandis Perkins, DO  carvedilol  (COREG ) 25 MG tablet Take 1 tablet (25 mg total) by mouth 2 (two) times daily. 06/01/23  Yes Sabharwal, Aditya, DO  Cholecalciferol (VITAMIN D3) 2000 UNITS TABS Take 2,000 Units by mouth daily. 07/31/14  Yes Funches, Josalyn, MD  clopidogrel  (PLAVIX ) 75 MG tablet Take 1 tablet (75 mg total) by mouth in the morning. 06/30/23  Yes Colletta Manuelita Garre, PA-C  dapagliflozin  propanediol (FARXIGA ) 10 MG TABS tablet Take 1 tablet (10 mg total) by mouth daily. 07/06/23  Yes Colletta Manuelita Garre, PA-C  Evolocumab  (REPATHA  SURECLICK) 140 MG/ML SOAJ Inject 140 mg into the skin every 14 (fourteen) days. 01/12/23  Yes Sabharwal, Aditya, DO  fluticasone  (FLONASE ) 50 MCG/ACT nasal spray Place 1 spray into both nostrils daily as needed for allergies.   Yes [provider]  furosemide  (LASIX ) 20 MG tablet TAKE 1 TABLET (20 MG TOTAL) BY MOUTH AS NEEDED FOR FLUID OR EDEMA. 06/29/23  Yes Bensimhon, Toribio SAUNDERS, MD  hydrALAZINE  (APRESOLINE ) 25  MG tablet Take 1 tablet (25 mg total) by mouth 3 (three) times daily. 03/03/23  Yes Sabharwal, Aditya, DO  isosorbide  mononitrate (IMDUR ) 30 MG 24 hr tablet Take 30 mg by mouth daily.   Yes [provider]  metFORMIN (GLUCOPHAGE) 500 MG tablet Take 500 mg by mouth 2 (two) times daily with a meal. 09/08/22  Yes [provider]  Multiple Vitamin (MULTIVITAMIN WITH MINERALS) TABS tablet Take 1 tablet by mouth in the morning. Centrum   Yes [provider]  sacubitril-valsartan (ENTRESTO) 97-103 MG Take 1 tablet by mouth 2 (two) times daily.   Yes [provider]  spironolactone (ALDACTONE) 25 MG tablet Take 25 mg by mouth in the morning.   Yes  [provider]  nitroGLYCERIN  (NITROSTAT ) 0.3 MG SL tablet Place 1 tablet (0.3 mg total) under the tongue every 5 (five) minutes as needed for chest pain (Only take medication if you have chest pain. Please go to an emergency department if you have chest pain). 09/26/22 09/26/23  Kandis Perkins, DO    Family History Family History  Problem Relation Age of Onset   Dementia Mother    Dementia Father     Social History Social History   Tobacco Use   Smoking status: Former    Current packs/day: 0.25    Types: Cigarettes   Smokeless tobacco: Never  Vaping Use   Vaping status: Never Used  Substance Use Topics   Alcohol use: Yes    Comment: occas.   Drug use: Yes    Types: Marijuana     Allergies   Patient has no known allergies.   Review of Systems Review of Systems  Constitutional:  Negative for chills and fever.  Eyes:  Negative for discharge and redness.  Respiratory:  Negative for shortness of breath.   Musculoskeletal:  Positive for arthralgias.  Skin:  Negative for color change and wound.  Neurological:  Negative for numbness.     Physical Exam Triage Vital Signs ED Triage Vitals  Encounter Vitals Group     BP 11/07/23 1301 102/70     Girls Systolic BP Percentile --      Girls Diastolic BP Percentile --      Boys Systolic BP Percentile --      Boys Diastolic BP Percentile --      Pulse Rate 11/07/23 1301 79     Resp 11/07/23 1301 18     Temp 11/07/23 1301 98 F (36.7 C)     Temp Source 11/07/23 1301 Oral     SpO2 11/07/23 1301 95 %     Weight --      Height 11/07/23 1301 5' 7 (1.702 m)     Head Circumference --      Peak Flow --      Pain Score 11/07/23 1259 9     Pain Loc --      Pain Education --      Exclude from Growth Chart --    No data found.  Updated Vital Signs BP 102/70 (BP Location: Right Arm)   Pulse 79   Temp 98 F (36.7 C) (Oral)   Resp 18   Ht 5' 7 (1.702 m)   SpO2 95%   BMI 32.26 kg/m   Visual Acuity Right Eye  Distance:   Left Eye Distance:   Bilateral Distance:    Right Eye Near:   Left Eye Near:    Bilateral Near:     Physical Exam Vitals and  nursing note reviewed.  Constitutional:      General: He is not in acute distress.    Appearance: Normal appearance. He is not ill-appearing.  HENT:     Head: Normocephalic and atraumatic.  Eyes:     Conjunctiva/sclera: Conjunctivae normal.  Cardiovascular:     Rate and Rhythm: Normal rate.  Pulmonary:     Effort: Pulmonary effort is normal.  Neurological:     Mental Status: He is alert.  Psychiatric:        Mood and Affect: Mood normal.        Behavior: Behavior normal.        Thought Content: Thought content normal.      UC Treatments / Results  Labs (all labs ordered are listed, but only abnormal results are displayed) Labs Reviewed - No data to display  EKG   Radiology No results found.  Procedures Procedures (including critical care time)  Medications Ordered in UC Medications - No data to display  Initial Impression / Assessment and Plan / UC Course  I have reviewed the triage vital signs and the nursing notes.  Pertinent labs & imaging results that were available during my care of the patient were reviewed by me and considered in my medical decision making (see chart for details).     *** Final Clinical Impressions(s) / UC Diagnoses   Final diagnoses:  None   Discharge Instructions   None    ED Prescriptions   None    PDMP not reviewed this encounter.

## 2023-11-07 NOTE — ED Triage Notes (Signed)
 Presenting with pain in the palm of the right hand at the base of the thumb onset yesterday. Pain now going up into the wrist and swollen. No known falls or injuries.   Patient has not taken anything for pain and swelling.

## 2023-11-17 ENCOUNTER — Other Ambulatory Visit (HOSPITAL_COMMUNITY): Payer: Self-pay | Admitting: Cardiology

## 2023-12-04 ENCOUNTER — Other Ambulatory Visit: Payer: Self-pay | Admitting: Cardiology

## 2023-12-04 DIAGNOSIS — I2510 Atherosclerotic heart disease of native coronary artery without angina pectoris: Secondary | ICD-10-CM

## 2023-12-04 DIAGNOSIS — E785 Hyperlipidemia, unspecified: Secondary | ICD-10-CM

## 2023-12-11 ENCOUNTER — Other Ambulatory Visit (HOSPITAL_COMMUNITY): Payer: Self-pay | Admitting: Physician Assistant

## 2023-12-20 ENCOUNTER — Other Ambulatory Visit (HOSPITAL_COMMUNITY): Payer: Self-pay | Admitting: Physician Assistant

## 2023-12-30 ENCOUNTER — Ambulatory Visit (INDEPENDENT_AMBULATORY_CARE_PROVIDER_SITE_OTHER): Payer: 59

## 2023-12-30 DIAGNOSIS — I255 Ischemic cardiomyopathy: Secondary | ICD-10-CM

## 2023-12-30 DIAGNOSIS — I2510 Atherosclerotic heart disease of native coronary artery without angina pectoris: Secondary | ICD-10-CM

## 2023-12-30 LAB — CUP PACEART REMOTE DEVICE CHECK
Battery Remaining Longevity: 81 mo
Battery Remaining Percentage: 84 %
Battery Voltage: 2.99 V
Brady Statistic AP VP Percent: 5.2 %
Brady Statistic AP VS Percent: 1 %
Brady Statistic AS VP Percent: 94 %
Brady Statistic AS VS Percent: 1 %
Brady Statistic RA Percent Paced: 5.2 %
Date Time Interrogation Session: 20251009020215
HighPow Impedance: 75 Ohm
Implantable Lead Connection Status: 753985
Implantable Lead Connection Status: 753985
Implantable Lead Connection Status: 753985
Implantable Lead Implant Date: 20241010
Implantable Lead Implant Date: 20241010
Implantable Lead Implant Date: 20241010
Implantable Lead Location: 753858
Implantable Lead Location: 753859
Implantable Lead Location: 753860
Implantable Pulse Generator Implant Date: 20241010
Lead Channel Impedance Value: 390 Ohm
Lead Channel Impedance Value: 450 Ohm
Lead Channel Impedance Value: 800 Ohm
Lead Channel Pacing Threshold Amplitude: 0.625 V
Lead Channel Pacing Threshold Amplitude: 0.875 V
Lead Channel Pacing Threshold Amplitude: 1.125 V
Lead Channel Pacing Threshold Pulse Width: 0.5 ms
Lead Channel Pacing Threshold Pulse Width: 0.5 ms
Lead Channel Pacing Threshold Pulse Width: 0.5 ms
Lead Channel Sensing Intrinsic Amplitude: 12 mV
Lead Channel Sensing Intrinsic Amplitude: 4.9 mV
Lead Channel Setting Pacing Amplitude: 1.375
Lead Channel Setting Pacing Amplitude: 1.625
Lead Channel Setting Pacing Amplitude: 2.125
Lead Channel Setting Pacing Pulse Width: 0.5 ms
Lead Channel Setting Pacing Pulse Width: 0.5 ms
Lead Channel Setting Sensing Sensitivity: 0.5 mV
Pulse Gen Serial Number: 211015510
Zone Setting Status: 755011

## 2023-12-31 NOTE — Progress Notes (Signed)
 Remote ICD Transmission

## 2024-01-04 NOTE — Progress Notes (Signed)
 Remote ICD Transmission

## 2024-01-06 ENCOUNTER — Ambulatory Visit: Payer: Self-pay | Admitting: Cardiovascular Disease

## 2024-02-15 ENCOUNTER — Telehealth (HOSPITAL_COMMUNITY): Payer: Self-pay

## 2024-02-15 ENCOUNTER — Ambulatory Visit (HOSPITAL_COMMUNITY)
Admission: RE | Admit: 2024-02-15 | Discharge: 2024-02-15 | Disposition: A | Discharge status: Home / Self Care | Source: Ambulatory Visit | Attending: Cardiology | Admitting: Cardiology

## 2024-02-15 ENCOUNTER — Encounter (HOSPITAL_COMMUNITY): Payer: Self-pay

## 2024-02-15 VITALS — BP 142/90 | HR 84 | Ht 67.0 in | Wt 206.2 lb

## 2024-02-15 DIAGNOSIS — M85641 Other cyst of bone, right hand: Secondary | ICD-10-CM | POA: Insufficient documentation

## 2024-02-15 DIAGNOSIS — E118 Type 2 diabetes mellitus with unspecified complications: Secondary | ICD-10-CM

## 2024-02-15 DIAGNOSIS — G9520 Unspecified cord compression: Secondary | ICD-10-CM | POA: Insufficient documentation

## 2024-02-15 DIAGNOSIS — G5600 Carpal tunnel syndrome, unspecified upper limb: Secondary | ICD-10-CM | POA: Diagnosis not present

## 2024-02-15 DIAGNOSIS — E785 Hyperlipidemia, unspecified: Secondary | ICD-10-CM | POA: Insufficient documentation

## 2024-02-15 DIAGNOSIS — R42 Dizziness and giddiness: Secondary | ICD-10-CM | POA: Insufficient documentation

## 2024-02-15 DIAGNOSIS — I5042 Chronic combined systolic (congestive) and diastolic (congestive) heart failure: Secondary | ICD-10-CM | POA: Diagnosis not present

## 2024-02-15 DIAGNOSIS — I5032 Chronic diastolic (congestive) heart failure: Secondary | ICD-10-CM | POA: Diagnosis present

## 2024-02-15 DIAGNOSIS — I255 Ischemic cardiomyopathy: Secondary | ICD-10-CM | POA: Insufficient documentation

## 2024-02-15 DIAGNOSIS — Z95810 Presence of automatic (implantable) cardiac defibrillator: Secondary | ICD-10-CM

## 2024-02-15 DIAGNOSIS — Z955 Presence of coronary angioplasty implant and graft: Secondary | ICD-10-CM | POA: Diagnosis not present

## 2024-02-15 DIAGNOSIS — Z79899 Other long term (current) drug therapy: Secondary | ICD-10-CM | POA: Insufficient documentation

## 2024-02-15 DIAGNOSIS — Z7984 Long term (current) use of oral hypoglycemic drugs: Secondary | ICD-10-CM | POA: Diagnosis not present

## 2024-02-15 DIAGNOSIS — Z4502 Encounter for adjustment and management of automatic implantable cardiac defibrillator: Secondary | ICD-10-CM | POA: Insufficient documentation

## 2024-02-15 DIAGNOSIS — E1122 Type 2 diabetes mellitus with diabetic chronic kidney disease: Secondary | ICD-10-CM | POA: Diagnosis not present

## 2024-02-15 DIAGNOSIS — I4729 Other ventricular tachycardia: Secondary | ICD-10-CM

## 2024-02-15 DIAGNOSIS — Z8673 Personal history of transient ischemic attack (TIA), and cerebral infarction without residual deficits: Secondary | ICD-10-CM | POA: Diagnosis not present

## 2024-02-15 DIAGNOSIS — I639 Cerebral infarction, unspecified: Secondary | ICD-10-CM

## 2024-02-15 DIAGNOSIS — I472 Ventricular tachycardia, unspecified: Secondary | ICD-10-CM | POA: Diagnosis not present

## 2024-02-15 DIAGNOSIS — E1142 Type 2 diabetes mellitus with diabetic polyneuropathy: Secondary | ICD-10-CM | POA: Diagnosis not present

## 2024-02-15 DIAGNOSIS — I2510 Atherosclerotic heart disease of native coronary artery without angina pectoris: Secondary | ICD-10-CM | POA: Diagnosis not present

## 2024-02-15 DIAGNOSIS — I428 Other cardiomyopathies: Secondary | ICD-10-CM | POA: Insufficient documentation

## 2024-02-15 DIAGNOSIS — N1831 Chronic kidney disease, stage 3a: Secondary | ICD-10-CM | POA: Insufficient documentation

## 2024-02-15 DIAGNOSIS — Z7902 Long term (current) use of antithrombotics/antiplatelets: Secondary | ICD-10-CM | POA: Insufficient documentation

## 2024-02-15 LAB — LIPID PANEL
Cholesterol: 83 mg/dL (ref 0–200)
HDL: 55 mg/dL (ref >40)
LDL Cholesterol: 8 mg/dL (ref 0–99)
Total CHOL/HDL Ratio: 1.5 ratio
Triglycerides: 101 mg/dL (ref <150)
VLDL: 20 mg/dL (ref 0–40)

## 2024-02-15 LAB — BASIC METABOLIC PANEL WITH GFR
Anion gap: 10 (ref 5–15)
BUN: 22 mg/dL (ref 8–23)
CO2: 21 mmol/L — ABNORMAL LOW (ref 22–32)
Calcium: 9.4 mg/dL (ref 8.9–10.3)
Chloride: 111 mmol/L (ref 98–111)
Creatinine, Ser: 1.53 mg/dL — ABNORMAL HIGH (ref 0.61–1.24)
GFR, Estimated: 50 mL/min — ABNORMAL LOW (ref >60)
Glucose, Bld: 104 mg/dL — ABNORMAL HIGH (ref 70–99)
Potassium: 4.4 mmol/L (ref 3.5–5.1)
Sodium: 142 mmol/L (ref 135–145)

## 2024-02-15 LAB — IRON AND TIBC
Iron: 101 ug/dL (ref 45–182)
Saturation Ratios: 29 % (ref 17.9–39.5)
TIBC: 346 ug/dL (ref 250–450)
UIBC: 245 ug/dL

## 2024-02-15 LAB — FERRITIN: Ferritin: 88 ng/mL (ref 24–336)

## 2024-02-15 LAB — BRAIN NATRIURETIC PEPTIDE: B Natriuretic Peptide: 67.1 pg/mL (ref 0.0–100.0)

## 2024-02-15 MED ORDER — REPATHA SURECLICK 140 MG/ML ~~LOC~~ SOAJ: 140.0000 mg | SUBCUTANEOUS | 1 refills | Status: AC

## 2024-02-15 MED ORDER — SACUBITRIL-VALSARTAN 97-103 MG PO TABS: 1.0000 | ORAL_TABLET | Freq: Two times a day (BID) | ORAL | 3 refills | Status: AC

## 2024-02-15 MED ORDER — CARVEDILOL 12.5 MG PO TABS: 12.5000 mg | ORAL_TABLET | Freq: Two times a day (BID) | ORAL | 3 refills | Status: AC

## 2024-02-15 MED ORDER — FUROSEMIDE 20 MG PO TABS: 20.0000 mg | ORAL_TABLET | Freq: Every day | ORAL | 3 refills | Status: DC

## 2024-02-15 MED ORDER — SPIRONOLACTONE 25 MG PO TABS: 25.0000 mg | ORAL_TABLET | Freq: Every morning | ORAL | 3 refills | Status: AC

## 2024-02-15 MED ORDER — ISOSORBIDE MONONITRATE ER 30 MG PO TB24: 30.0000 mg | ORAL_TABLET | Freq: Every day | ORAL | 3 refills | Status: AC

## 2024-02-15 NOTE — Patient Instructions (Addendum)
 Stop hydralazine . Decrease Coreg  to 12.5 mg twice daily - updated Rx sent with new pill strength. Start taking Lasix  20 mg daily - updated Rx sent. Labs today - will call you if abnormal. Repeat labs in 7 - 10 days. See below.  Echo has been ordered for you - we will call to schedule.  Referral sent to Orthopedic for evaluation of cyst on your right hand. They should call you to schedule first appointment. If not, call them at number below. Refills sent on requested medications. Return to [facility] in 2 months - see below. If you have any questions, you may call us  at [phone].

## 2024-02-15 NOTE — Telephone Encounter (Signed)
 Called to confirm/remind patient of their appointment at the [facility] on 02/15/24 3:30.   Appointment:   [] Confirmed  [x] Left mess   [] No answer/No voice mail  [] VM Full/unable to leave message  [] Phone not in service  Patient reminded to bring all medications and/or complete list.  Confirmed patient has transportation. Gave directions, instructed to utilize valet parking.

## 2024-02-15 NOTE — Progress Notes (Signed)
 [facility] NOTE  Primary Care: Celestia Harder, NP Primary Cardiologist: Lonni Cash, MD HF MD: Dr. Cherrie  HPI: Steven Brady is a 68 y.o. male with HFrEF, CAD, cardioembolic stroke in 2023, CKD 3, left bundle branch block, CAD s/p PCI in 2024.  He was previously followed at [facility] for chronic HFrEF for at least 10 years and stroke in 2022 with EF as low as 10%. In April of 2024 was worked up for CAD at [facility]. Coronary CT showed multivessel CAD. CMR showed LVEF 38%, RVEF 80%, there was diffuse, non-CAD scarring of the LV, particularly the lateral wall, there was also scar consistent with RCA disease. Underwent LHC with PCI to the RCA due to positive FFR.    He presented to [facility] in 7/24 for chest pain. Echo showed an EF of 25 to 30% with moderate LVH. He was seen by EP for LBBB and ICD was upgraded to BiV CRT-D 10/24.  On his most recent echo 6/25 EF 50-55% with G1DD, and nl RV function.   He returns today for heart failure follow up. Overall feeling okay, does have some fatigue and significant dizziness. Has felt dizzy since his stroke, however it has been feeling worse lately. Denies shortness of breath, can climb a flight of stairs without issue. Does have some ankle and foot edema at times, takes a lasix  when this happens. Currently taking 3-4x /week. Denies chest pain, palpitations, and dizziness. Able to perform ADLs. Appetite okay. The last couple months SBP has been dropping to the 80s and maintaining. He feels poorly when this happens. Compliant with all medications.  Current Outpatient Medications  Medication Sig Dispense Refill   acetaminophen  (TYLENOL ) 500 MG tablet Take 1,000 mg by mouth as needed.     atorvastatin  (LIPITOR) 80 MG tablet TAKE 1 TABLET BY MOUTH EVERY DAY 30 tablet 0   carvedilol  (COREG ) 25 MG tablet Take 1 tablet (25 mg total) by mouth 2 (two) times daily. 180 tablet 3   Cholecalciferol (VITAMIN D3) 2000  UNITS TABS Take 2,000 Units by mouth daily. 30 tablet 11   clopidogrel  (PLAVIX ) 75 MG tablet TAKE 1 TABLET (75 MG TOTAL) BY MOUTH IN THE MORNING 30 tablet 5   dapagliflozin  propanediol (FARXIGA ) 10 MG TABS tablet Take 1 tablet (10 mg total) by mouth daily. 60 tablet 11   Evolocumab  (REPATHA  SURECLICK) 140 MG/ML SOAJ INJECT 140MG  INTO THE SKIN EVERY 14 DAYS 6 mL 1   fluticasone  (FLONASE ) 50 MCG/ACT nasal spray Place 1 spray into both nostrils daily as needed for allergies.     furosemide  (LASIX ) 20 MG tablet TAKE 1 TABLET (20 MG TOTAL) BY MOUTH AS NEEDED FOR FLUID OR EDEMA. 90 tablet 0   isosorbide  mononitrate (IMDUR ) 30 MG 24 hr tablet Take 1 tablet (30 mg total) by mouth daily. PLEASE SCHEDULE APPOINTMENT WITH NEW PROVIDER 60 tablet 0   metFORMIN (GLUCOPHAGE) 500 MG tablet Take 500 mg by mouth 2 (two) times daily with a meal.     nitroGLYCERIN  (NITROSTAT ) 0.3 MG SL tablet Place 1 tablet (0.3 mg total) under the tongue every 5 (five) minutes as needed for chest pain (Only take medication if you have chest pain. Please go to an emergency department if you have chest pain). 10 tablet 0   sacubitril -valsartan  (ENTRESTO ) 97-103 MG Take 1 tablet by mouth 2 (two) times daily.     spironolactone  (ALDACTONE ) 25 MG tablet Take 25 mg by mouth in the morning.  Multiple Vitamin (MULTIVITAMIN WITH MINERALS) TABS tablet Take 1 tablet by mouth in the morning. Centrum (Patient not taking: Reported on 02/15/2024)     predniSONE  (DELTASONE ) 10 MG tablet Take 2 tablets (20 mg total) by mouth daily. (Patient not taking: Reported on 02/15/2024) 15 tablet 0   No current facility-administered medications for this encounter.   No Known Allergies  Vitals:   02/15/24 1543  BP: (!) 142/90  Pulse: 84  SpO2: 98%    Filed Weights   02/15/24 1543  Weight: 93.5 kg (206 lb 3.2 oz)   PHYSICAL EXAM: General: Well appearing. No distress  Cardiac: JVP difficult to assess. No murmurs  Resp: Lung sounds clear and equal  B/L Extremities: Warm and dry.  No peripheral edema.  Neuro: A&O x3. Affect pleasant.   DATA REVIEW  ECHO: 09/26/22: LVEF 25-30% w/ prominent IVS dyssynchrony due to LBBB 03/03/23: LVEF 30%, normal RV function  05/14/23: LVEF 35%-40%, normal RV function 08/26/23: LVEF 55%, normal RV function.   CATH: 07/13/22: Left Anterior Descending: The vessel exhibits minimal luminal irregularities.  Left Circumflex: The vessel exhibits minimal luminal irregularities. Dist Cx lesion is 70% stenosed. Third Obtuse Marginal Branch: 3rd Mrg lesion is 35% stenosed.  Right Coronary Artery: Ost RCA to Prox RCA lesion is 30% stenosed. Prox RCA lesion is 70% stenosed. Lesion length: 13 mm. TIMI flow is 3. The lesion is not complex (non high-C). Mid RCA lesion is 70% stenosed. Lesion length: 15 mm. TIMI flow is 3. The lesion is not complex (non high-C). Dist RCA lesion is 70% stenosed. Lesion length: 20 mm. TIMI flow is 3. The lesion is not complex (non high-C). Pressure wire/FFR was performed on the lesion. FFR: 0.79. Right Posterior Descending Artery: RPDA lesion is 30% stenosed.  S/P PCI to prox, mid and distal RCA  St. Jude Interrogation (personally reviewed): 3 episodes of NSVT, longest 6 seconds. Corvue with thoracic impedence last month, trending back up.   ASSESSMENT & PLAN:  Chronic HFimpEF - Mixed NICM/ICM.  S/p PCI x 3 to the RCA 4/24.  EF down to 10% in 2022. Recovered up to 55% on last echo 6/25. CMR 4/25 with diffuse non CAD LGE in LV (specifically the lateral wall), ischemia noted at RCA site. IFE normal. With peripheral neuropathy, carpal tunnel, and spinal compression, suspicious for amyloidosis. D/w Dr. Bensimhon, who reviewed imaging, does not appear that this is amyloid.  - NYHA class IIB. Mildly volume up on exam. Start taking Lasix  20 mg daily. BMET/BNP, repeat in 7-10 days. - GDMT:  Vasodilators: stop hydral, continue imdur  30mg  daily, will notify our office if SBP persistently  elevated  ARB/ARNi: continue entresto  97/103 mg bid ? blocker: decrease coreg  to 12.5 mg bid d/t dizziness  MRA: continue spironolactone  25 mg daily SGLT2i: continue farxiga  10 mg daily - s/p St.Jude biV ICD 10/24. Device interrogated - schedule repeat echo - refills sent - educated on congestive heart failure and medication list, was not sure what his medications were for  2.  Coronary artery disease - s/p PCI to the RCA x 3 on 4/24. Diffuse disease throughout - no chest pain  3.  History of cardioembolic stroke - Off eliquis  with recovered EF. On Plavix .  4. Hyperlipidemia - On statin + repatha  - check lipid panel  5. T2DM - On metformin and jardiance  6. Peripheral neuropathy - symptoms are significant bilaterally - has cyst in R hand that is exacerbating; refer to orthopedics   7. CKD 3a -  BMET today  8. NSVT - 3 episodes noted on device interrogation; longest was 6 seconds  Follow up in 2 months with APP  Ameliana Brashear, NP 02/15/24

## 2024-02-16 ENCOUNTER — Ambulatory Visit (HOSPITAL_COMMUNITY): Payer: Self-pay | Admitting: Cardiology

## 2024-02-16 DIAGNOSIS — I5032 Chronic diastolic (congestive) heart failure: Secondary | ICD-10-CM

## 2024-02-16 NOTE — Addendum Note (Signed)
 Encounter addended by: Emmarose Klinke, NP on: 02/16/2024 3:01 PM  Actions taken: Clinical Note Signed

## 2024-02-21 ENCOUNTER — Encounter (HOSPITAL_COMMUNITY): Payer: Self-pay

## 2024-02-21 ENCOUNTER — Telehealth (HOSPITAL_COMMUNITY): Payer: Self-pay

## 2024-02-21 ENCOUNTER — Telehealth (HOSPITAL_COMMUNITY): Payer: Self-pay | Admitting: Cardiology

## 2024-02-21 DIAGNOSIS — I5023 Acute on chronic systolic (congestive) heart failure: Secondary | ICD-10-CM | POA: Insufficient documentation

## 2024-02-21 DIAGNOSIS — D509 Iron deficiency anemia, unspecified: Secondary | ICD-10-CM | POA: Insufficient documentation

## 2024-02-21 NOTE — Telephone Encounter (Signed)
 Auth Submission: NO AUTH NEEDED Site of care: Site of care: [facility] Payer: [employer] Medicare Dual Medication & CPT/J Code(s) submitted: Feraheme (ferumoxytol) [id number] Diagnosis Code: D50.9, I50.23 Route of submission (phone, fax, portal): portal Phone # Fax # Auth type: Buy/Bill HB Units/visits requested: 510mg  x 2 doses Reference number: [id number] Approval from: 02/21/24 to 03/22/24

## 2024-02-21 NOTE — Telephone Encounter (Signed)
 Patient referred to infusion pharmacy team for ambulatory infusion of IV iron.  Insurance - [employer] Medicare  Site of care - Site of care: [facility] Dx code - I50.23 IV Iron Therapy - Feraheme 510 mg Iv x 2  Infusion appointments - Scheduling team will schedule patient as soon as possible.   Steven Buster D. Emrah Ariola, PharmD

## 2024-02-25 ENCOUNTER — Ambulatory Visit (HOSPITAL_COMMUNITY): Admission: RE | Admit: 2024-02-25 | Discharge: 2024-02-25 | Attending: Internal Medicine

## 2024-02-25 DIAGNOSIS — I5032 Chronic diastolic (congestive) heart failure: Secondary | ICD-10-CM

## 2024-02-25 LAB — BASIC METABOLIC PANEL WITH GFR
Anion gap: 8 (ref 5–15)
BUN: 16 mg/dL (ref 8–23)
CO2: 32 mmol/L (ref 22–32)
Calcium: 9.3 mg/dL (ref 8.9–10.3)
Chloride: 106 mmol/L (ref 98–111)
Creatinine, Ser: 1.63 mg/dL — ABNORMAL HIGH (ref 0.61–1.24)
GFR, Estimated: 46 mL/min — ABNORMAL LOW (ref >60)
Glucose, Bld: 109 mg/dL — ABNORMAL HIGH (ref 70–99)
Potassium: 4.5 mmol/L (ref 3.5–5.1)
Sodium: 146 mmol/L — ABNORMAL HIGH (ref 135–145)

## 2024-02-28 ENCOUNTER — Inpatient Hospital Stay (HOSPITAL_COMMUNITY): Admission: RE | Admit: 2024-02-28 | Discharge: 2024-02-28 | Attending: Cardiology

## 2024-02-28 VITALS — BP 136/89 | HR 64 | Temp 98.0°F | Resp 16

## 2024-02-28 DIAGNOSIS — D509 Iron deficiency anemia, unspecified: Secondary | ICD-10-CM | POA: Insufficient documentation

## 2024-02-28 DIAGNOSIS — I5023 Acute on chronic systolic (congestive) heart failure: Secondary | ICD-10-CM | POA: Insufficient documentation

## 2024-02-28 MED ORDER — SODIUM CHLORIDE 0.9 % IV SOLN
510.0000 mg | Freq: Once | INTRAVENOUS | Status: AC
  Administered 2024-02-28: 510 mg via INTRAVENOUS
  Filled 2024-02-28: qty 510

## 2024-03-06 ENCOUNTER — Ambulatory Visit (HOSPITAL_COMMUNITY)
Admission: RE | Admit: 2024-03-06 | Discharge: 2024-03-06 | Disposition: A | Source: Ambulatory Visit | Attending: Cardiology

## 2024-03-06 ENCOUNTER — Encounter (HOSPITAL_COMMUNITY)

## 2024-03-06 VITALS — BP 155/97 | HR 61 | Temp 98.0°F | Resp 16

## 2024-03-06 DIAGNOSIS — D509 Iron deficiency anemia, unspecified: Secondary | ICD-10-CM | POA: Diagnosis present

## 2024-03-06 DIAGNOSIS — I5023 Acute on chronic systolic (congestive) heart failure: Secondary | ICD-10-CM | POA: Insufficient documentation

## 2024-03-06 MED ORDER — SODIUM CHLORIDE 0.9 % IV SOLN
510.0000 mg | Freq: Once | INTRAVENOUS | Status: AC
  Administered 2024-03-06: 14:00:00 510 mg via INTRAVENOUS
  Filled 2024-03-06: qty 510

## 2024-03-13 ENCOUNTER — Encounter: Payer: Self-pay | Admitting: Internal Medicine

## 2024-03-14 NOTE — Progress Notes (Signed)
 [facility] ([place]) [address] [place] [address] [phone]   Date of Service: 03/14/2024  Patient Name: Steven Brady      MRN: [id number]      Date of Birth: 1956-03-08 Primary Care Physician: No primary care provider on file. Referring Provider: Celestia Harder, NP Postoperative Day: 187 Days Post-Op  Chief Complaint  Patient presents with   Follow-up   Neck Pain    ASSESSMENT AND PLAN: The patient is approximately 6 months postoperative from C4-C7 ACDF. His preoperative cervical myelopathy symptoms continue to improve, but have not resolved.  We again discussed that symptoms of myelopathy can persist indefinitely.  Specifically, the RUE paresthesias (previously worked up by Ortho for CTS and felt to be related to cervical myelopathy) persist.  He has recently seen Neurology for same complaints and they have started him on Baclofen and discussed potential for Botox injections.  He will follow-up with me in 3 months to assess his progress.  HISTORY OF PRESENT ILLNESS: 68 y.o. male presenting for a a scheduled routine follow-up visit postoperative from C4/5, C5/6, C6/7 ACDF on 09/09/2023.  Surgery was performed secondary to spinal cord compression with associated cervical myelopathy.  Specifically patient described right-sided weakness, painful RUE paresthesias, and painful paresthesias involving left fingertips pre-operatively. His preoperative exam was remarkable for profound weakness in the right deltoid, triceps and weakness in the bilateral hands and fingers. He had upper motor neuron signs with hyper-reflexes, and 1 beat of clonus on the right.    11/02/2023:  Today he describes an intense pain from the right palm across the wrist to the elbow.  He describes a stabbing pain in the right thumb region. He says his left hand does not feel as tingly as it did preoperatively. He describes a dull pain in the  posterior cervical region which is present when he turns his head a certain way.  He denies any swallowing issues.  He says he can now lay on his right side a little easier at night than preoperatively. He denies any falls since surgery, but continues to have issues with balance which a component he correlates with his history of cerebellar stroke sustained in 2022.  He has been going to [employer] and the [employer] doing light aerobic activities. He started postoperative PT/OT at [facility] last week.   12/31/2023:  Since last visit he has undergone an EMG-NCV ordered by [facility] to evaluate for CTS.  He says they have been doing injections in his wrist which were not beneficial.  He says the nerve study was remarkable only for mild CTS and they were hesitant to do CTR given concern that a component of symptoms were coming from his spinal cord injury.  In the interim he has been prescribed Lyrica 75 mg 2x/day by our pain management Spine Specialist colleagues for the persistent RUE paresthesias.  He says the pre-operative neck and radiating arm pain has improved.  He continues to participate in outpatient PT/OT, and has also joined [employer] and uses band therapy. He reminds me that 12/21/2020 was the Anniversary Day of his cerebellar stroke which impacted his balance.  03/14/2024: Today he says his neck feels a little stiff, commenting he has stopped doing his neck stretches routinely. Does have bands at home that he uses intermittently. He does continue to have right hand burning, numbness and tingling, has undergone an EMG-NCV through [facility] which was negative for CTS.  He has also  been evaluated by Neurology who suspects patient is developing right hand contracture vs dystonia and was prescribed Baclofen 2x/day, as well as discussing potential for Botox injection.  PHYSICAL EXAM: Blood pressure (!) 155/96, pulse 69, temperature 97.3 F (36.3 C), temperature source  Temporal.  Description: African American male There is no height or weight on file to calculate BMI.  Mental Status: Alert, oriented, thought content appropriate  Incision: well-healed  Motor: Right UE grip 5/5, was 4/5 last visit and pre-op  Left UE grip  5/5 Right deltoid  4/5, was 3/5 pre-op  Left deltoid  5/5 Right biceps  5/5 Left biceps  5/5 Right triceps 4/5, last visit 5/5, prior visit 4/5, was 3/5 pre-op  Left triceps 5/5 Right wrist extensors 5/5 Left wrist extensors 5/5 Right finger extensors 5/5, was 4/5 last visit and pre-op  Left finger extensors 5/5, was 4/5 pre-op    Right hip flexor  5/5, was 4+/5 last visit and pre-op  Left hip flexor  5/5 Right quads  5/5, was 4+/5 last visit and pre-op  Left quads  5/5 Right anterior tibialis  5/5, was 4/5 pre-op  Left anterior tibialis  5/5 Right peroneal  5/5 with some breakaway, was 4+/5 last visit, was 4/5 pre-op   Sensory: impaired to light touch right thumb, palm, wrist Intact to light touch LUE Intact to light touch BLEs  Gait: Assisted:cane, chronic since his cerebellar stroke  VISIT DIAGNOSES: 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 Compression of spinal cord with myelopathy (*)      2. Degeneration of cervical intervertebral disc      3. Cervical spondylosis       ORDERS FOR THIS VISIT: Requested Prescriptions    No prescriptions requested or ordered in this encounter   No orders of the defined types were placed in this encounter.  As always I do thank you for allowing me to be involved in your patient's care and will continue to keep you informed as to their status. Lin Civatte, MD

## 2024-03-27 ENCOUNTER — Ambulatory Visit (HOSPITAL_COMMUNITY)
Admission: RE | Admit: 2024-03-27 | Discharge: 2024-03-27 | Disposition: A | Discharge status: Home / Self Care | Source: Ambulatory Visit | Attending: Cardiology | Admitting: Cardiology

## 2024-03-27 DIAGNOSIS — I5032 Chronic diastolic (congestive) heart failure: Secondary | ICD-10-CM | POA: Insufficient documentation

## 2024-03-27 DIAGNOSIS — I130 Hypertensive heart and chronic kidney disease with heart failure and stage 1 through stage 4 chronic kidney disease, or unspecified chronic kidney disease: Secondary | ICD-10-CM | POA: Diagnosis not present

## 2024-03-27 DIAGNOSIS — N189 Chronic kidney disease, unspecified: Secondary | ICD-10-CM | POA: Diagnosis not present

## 2024-03-27 DIAGNOSIS — E1122 Type 2 diabetes mellitus with diabetic chronic kidney disease: Secondary | ICD-10-CM | POA: Insufficient documentation

## 2024-03-27 DIAGNOSIS — I3481 Nonrheumatic mitral (valve) annulus calcification: Secondary | ICD-10-CM | POA: Diagnosis not present

## 2024-03-27 DIAGNOSIS — I358 Other nonrheumatic aortic valve disorders: Secondary | ICD-10-CM | POA: Diagnosis not present

## 2024-03-27 LAB — ECHOCARDIOGRAM COMPLETE
Area-P 1/2: 2.18 cm2
Calc EF: 43.3 %
S' Lateral: 4.1 cm
Single Plane A2C EF: 47.5 %
Single Plane A4C EF: 41.6 %

## 2024-03-27 NOTE — Progress Notes (Signed)
" °  Echocardiogram 2D Echocardiogram has been performed.  Urbano Milhouse 03/27/2024, 4:22 PM "

## 2024-03-30 ENCOUNTER — Ambulatory Visit: Payer: 59

## 2024-03-30 DIAGNOSIS — I5032 Chronic diastolic (congestive) heart failure: Secondary | ICD-10-CM

## 2024-03-31 LAB — CUP PACEART REMOTE DEVICE CHECK
Battery Remaining Longevity: 77 mo
Battery Remaining Percentage: 81 %
Battery Voltage: 2.99 V
Brady Statistic AP VP Percent: 10 %
Brady Statistic AP VS Percent: 1 %
Brady Statistic AS VP Percent: 88 %
Brady Statistic AS VS Percent: 1 %
Brady Statistic RA Percent Paced: 10 %
Date Time Interrogation Session: 20260108020216
HighPow Impedance: 74 Ohm
Implantable Lead Connection Status: 753985
Implantable Lead Connection Status: 753985
Implantable Lead Connection Status: 753985
Implantable Lead Implant Date: 20241010
Implantable Lead Implant Date: 20241010
Implantable Lead Implant Date: 20241010
Implantable Lead Location: 753858
Implantable Lead Location: 753859
Implantable Lead Location: 753860
Implantable Pulse Generator Implant Date: 20241010
Lead Channel Impedance Value: 400 Ohm
Lead Channel Impedance Value: 440 Ohm
Lead Channel Impedance Value: 730 Ohm
Lead Channel Pacing Threshold Amplitude: 0.625 V
Lead Channel Pacing Threshold Amplitude: 0.75 V
Lead Channel Pacing Threshold Amplitude: 1.5 V
Lead Channel Pacing Threshold Pulse Width: 0.5 ms
Lead Channel Pacing Threshold Pulse Width: 0.5 ms
Lead Channel Pacing Threshold Pulse Width: 0.5 ms
Lead Channel Sensing Intrinsic Amplitude: 12 mV
Lead Channel Sensing Intrinsic Amplitude: 3.6 mV
Lead Channel Setting Pacing Amplitude: 1.25 V
Lead Channel Setting Pacing Amplitude: 1.625
Lead Channel Setting Pacing Amplitude: 2.5 V
Lead Channel Setting Pacing Pulse Width: 0.5 ms
Lead Channel Setting Pacing Pulse Width: 0.5 ms
Lead Channel Setting Sensing Sensitivity: 0.5 mV
Pulse Gen Serial Number: 211015510
Zone Setting Status: 755011

## 2024-04-01 ENCOUNTER — Ambulatory Visit: Payer: Self-pay | Admitting: Cardiovascular Disease

## 2024-04-04 NOTE — Progress Notes (Signed)
 Remote ICD Transmission

## 2024-04-14 ENCOUNTER — Encounter: Payer: Self-pay | Admitting: Gastroenterology

## 2024-04-14 ENCOUNTER — Telehealth (HOSPITAL_COMMUNITY): Payer: Self-pay

## 2024-04-14 NOTE — Telephone Encounter (Signed)
 Called to confirm/remind patient of their appointment at the [facility] on 04/17/24.   Appointment:   [x] Confirmed  [] Left mess   [] No answer/No voice mail  [] VM Full/unable to leave message  [] Phone not in service  Patient reminded to bring all medications and/or complete list.  Confirmed patient has transportation. Gave directions, instructed to utilize valet parking.

## 2024-04-16 ENCOUNTER — Encounter (HOSPITAL_COMMUNITY): Payer: Self-pay | Admitting: *Deleted

## 2024-04-17 ENCOUNTER — Ambulatory Visit (HOSPITAL_COMMUNITY)

## 2024-04-19 NOTE — Progress Notes (Signed)
 "  ADVANCED HEART FAILURE CLINIC NOTE  Primary Care: [facility] Primary Cardiologist: Lonni Cash, MD Nephrologist: Dr. Norine HF Cardiologist: Dr. Cherrie  HPI: Steven Brady is a 69 y.o. male with HFrEF, CAD, cardioembolic stroke in 2023, CKD 3, left bundle branch block, CAD s/p PCI in 2024.  He was previously followed at [facility] for chronic HFrEF for at least 10 years and stroke in 2022 with EF as low as 10%. In April of 2024 was worked up for CAD at [facility]. Coronary CT showed multivessel CAD. CMR showed LVEF 38%, RVEF 80%, there was diffuse, non-CAD scarring of the LV, particularly the lateral wall, there was also scar consistent with RCA disease. Underwent LHC with PCI to the RCA due to + FFR.    He presented to [facility] in 7/24 for chest pain. Echo showed an EF of 25 to 30% with moderate LVH. He was seen by EP for LBBB and ICD was upgraded to BiV CRT-D 10/24.  Echo 6/25 EF 50-55% with G1DD, RV ok. Graduated from AHF clinic. Previously followed by Dr. Gardenia.  Echo 1/26 EF 40-45%, G1DD, RV ok  Today he returns for HF follow up with his significant other. He feels he has fluid on board, legs are swelling and wedding ring is tight. Breathing is more labored today, feels he cannot get a full breath in. Chronically dizzy, he attributes this to past CVA; no change with stopping hydralazine . Denies palpitations, abnormal bleeding, CP, edema, or PND/Orthopnea. Appetite ok. Weight at home 195 pounds. Taking all medications. Taking Lasix  for past 2 days. No cigarettes in 3 years.   Cardiac Studies ECHO: 7/24: EF 25-30% w/ prominent IVS dyssynchrony due to LBBB 12/24: EF 30%, normal RV function  2/25: EF 35%-40%, normal RV function 6/25: EF 55%, normal RV function.  1/26: EF 40-45%, RV ok  CATH: 07/13/22: Left Anterior Descending: The vessel exhibits minimal luminal irregularities.  Left Circumflex: The vessel exhibits minimal luminal  irregularities. Dist Cx lesion is 70% stenosed. Third Obtuse Marginal Branch: 3rd Mrg lesion is 35% stenosed.  Right Coronary Artery: Ost RCA to Prox RCA lesion is 30% stenosed. Prox RCA lesion is 70% stenosed. Lesion length: 13 mm. TIMI flow is 3. The lesion is not complex (non high-C). Mid RCA lesion is 70% stenosed. Lesion length: 15 mm. TIMI flow is 3. The lesion is not complex (non high-C). Dist RCA lesion is 70% stenosed. Lesion length: 20 mm. TIMI flow is 3. The lesion is not complex (non high-C). Pressure wire/FFR was performed on the lesion. FFR: 0.79. Right Posterior Descending Artery: RPDA lesion is 30% stenosed.  S/P PCI to prox, mid and distal RCA  CMR 4/24 at [facility]: LVEF 38%, RVEF normal 80%, non-CAD scarring of the LV, particularly the lateral wall, there was also scar consistent with RCA disease.  Current Outpatient Medications  Medication Sig Dispense Refill   acetaminophen  (TYLENOL ) 500 MG tablet Take 1,000 mg by mouth as needed.     atorvastatin  (LIPITOR) 80 MG tablet TAKE 1 TABLET BY MOUTH EVERY DAY 30 tablet 0   baclofen (LIORESAL) 10 MG tablet Take 10 mg by mouth 2 (two) times daily.     carvedilol  (COREG ) 12.5 MG tablet Take 1 tablet (12.5 mg total) by mouth 2 (two) times daily. 180 tablet 3   Cholecalciferol (VITAMIN D3) 2000 UNITS TABS Take 2,000 Units by mouth daily. 30 tablet 11   clopidogrel  (PLAVIX ) 75 MG tablet TAKE 1 TABLET (75 MG TOTAL) BY  MOUTH IN THE MORNING 30 tablet 5   dapagliflozin  propanediol (FARXIGA ) 10 MG TABS tablet Take 1 tablet (10 mg total) by mouth daily. 60 tablet 11   Evolocumab  (REPATHA  SURECLICK) 140 MG/ML SOAJ Inject 140 mg into the skin every 14 (fourteen) days. 6 mL 1   fluticasone  (FLONASE ) 50 MCG/ACT nasal spray Place 1 spray into both nostrils daily as needed for allergies.     furosemide  (LASIX ) 20 MG tablet Take 1 tablet (20 mg total) by mouth daily. 90 tablet 3   isosorbide  mononitrate (IMDUR ) 30 MG 24 hr tablet Take 1 tablet (30 mg  total) by mouth daily. 90 tablet 3   metFORMIN (GLUCOPHAGE) 500 MG tablet Take 500 mg by mouth 2 (two) times daily with a meal.     Multiple Vitamin (MULTIVITAMIN WITH MINERALS) TABS tablet Take 1 tablet by mouth in the morning. Centrum     nitroGLYCERIN  (NITROSTAT ) 0.3 MG SL tablet Place 1 tablet (0.3 mg total) under the tongue every 5 (five) minutes as needed for chest pain (Only take medication if you have chest pain. Please go to an emergency department if you have chest pain). 10 tablet 0   pregabalin (LYRICA) 150 MG capsule Take 150 mg by mouth in the morning, at noon, and at bedtime.     sacubitril -valsartan  (ENTRESTO ) 97-103 MG Take 1 tablet by mouth 2 (two) times daily. 180 tablet 3   spironolactone  (ALDACTONE ) 25 MG tablet Take 1 tablet (25 mg total) by mouth in the morning. 90 tablet 3   predniSONE  (DELTASONE ) 10 MG tablet Take 2 tablets (20 mg total) by mouth daily. (Patient not taking: Reported on 02/15/2024) 15 tablet 0   No current facility-administered medications for this encounter.   No Known Allergies  Wt Readings from Last 3 Encounters:  04/21/24 95.3 kg (210 lb)  02/15/24 93.5 kg (206 lb 3.2 oz)  08/26/23 93.4 kg (206 lb)   BP (!) 174/98   Pulse 64   Wt 95.3 kg (210 lb)   SpO2 98%   BMI 32.89 kg/m   PHYSICAL EXAM: General:  NAD. No resp difficulty, walked into clinic HEENT: Normal Neck: Supple. No JVD.  Cor: Regular rate & rhythm. No rubs, gallops or murmurs. Lungs: Clear Abdomen: Obese, nontender, nondistended.  Extremities: No cyanosis, clubbing, rash, tr LE edema Neuro: Alert & oriented x 3, moves all 4 extremities w/o difficulty. Affect pleasant.  ReDs reading: 32 %, normal  St. Jude Interrogation (personally reviewed): Volume up a bit, 98% BV pacing, 11% AVP, no AF or VT  ECG (personally reviewed): AV paced, 60 bpm  ASSESSMENT & PLAN:  Chronic HFimpEF - Mixed NICM/ICM.   - EF down to 10% in 2022. - S/p PCI x 3 to the RCA 4/24. - CMR 4/25: with  diffuse non CAD LGE in LV (specifically the lateral wall), ischemia noted at RCA site.  - IFE normal. With peripheral neuropathy, carpal tunnel, and spinal compression, suspicious for amyloidosis. Dr. Cherrie reviewed imaging, does not appear that this is amyloid.  - s/p St.Jude biV ICD 10/24 - Echo 6/25: EF 55%, recovered - Echo 1/26: EF down a bit, 40-45%.  - NYHA II, volume up a bit on device and exam, ReDs ok at 32% - Continue Lasix  20 mg daily x 1 week, then back to PRN - Restart hydralazine  25 mg tid, continue Imdur  30 mg daily. - Continue Coreg  12.5 mg bid - Continue Farxiga  10 mg daily - Continue Entresto  97/103 mg bid - Continue  spironolactone  25 mg daily. - Labs today, repeat BMET in 7-10 days. - Repeat echo at follow up to ensure EF stable.  2.  CAD - s/p PCI to the RCA x 3 on 4/24. Diffuse disease throughout - no chest pain - Continue Plavix  - Continue statin + Repatha   3.  History of cardioembolic stroke - Off eliquis  with recovered EF.  - Continue Plavix .  4. HLD - Continue atorvastatin  80 mg daily. - Continue Repatha , LDL 8 (11/25)  5. CKD 3a - Baseline SCr 1.4-1.6 - Continue Farxiga  - BMET today  6. HTN - BP elevated today - Restart hydralazine  as above - Avoiding hypotension with CKD - Check BP and log  7. NSVT - No recent events on device interrogation today  8. T2DM - On metformin and SGLT2i - Check A1C today  9. Obesity - Body mass index is 32.89 kg/m. - Refer to PharmD for [id number] (he has CAD, DM2, CKD and obesity)  Follow up in 3-4 months with Dr. Cherrie + echo  Harlene CHRISTELLA Gainer, FNP 04/21/24  "

## 2024-04-20 ENCOUNTER — Telehealth (HOSPITAL_COMMUNITY): Payer: Self-pay

## 2024-04-20 NOTE — Telephone Encounter (Signed)
 Called to confirm/remind patient of their appointment at the [facility] on 04/21/24.   Appointment:   [x] Confirmed  [] Left mess   [] No answer/No voice mail  [] VM Full/unable to leave message  [] Phone not in service  Patient reminded to bring all medications and/or complete list.  Confirmed patient has transportation. Gave directions, instructed to utilize valet parking.

## 2024-04-21 ENCOUNTER — Ambulatory Visit (HOSPITAL_COMMUNITY)
Admission: RE | Admit: 2024-04-21 | Discharge: 2024-04-21 | Disposition: A | Source: Ambulatory Visit | Attending: Family Medicine

## 2024-04-21 ENCOUNTER — Encounter (HOSPITAL_COMMUNITY): Payer: Self-pay

## 2024-04-21 ENCOUNTER — Other Ambulatory Visit (HOSPITAL_COMMUNITY): Payer: Self-pay

## 2024-04-21 VITALS — BP 174/98 | HR 64 | Wt 210.0 lb

## 2024-04-21 DIAGNOSIS — E66811 Obesity, class 1: Secondary | ICD-10-CM | POA: Diagnosis not present

## 2024-04-21 DIAGNOSIS — E118 Type 2 diabetes mellitus with unspecified complications: Secondary | ICD-10-CM | POA: Diagnosis not present

## 2024-04-21 DIAGNOSIS — I130 Hypertensive heart and chronic kidney disease with heart failure and stage 1 through stage 4 chronic kidney disease, or unspecified chronic kidney disease: Secondary | ICD-10-CM | POA: Diagnosis present

## 2024-04-21 DIAGNOSIS — R9431 Abnormal electrocardiogram [ECG] [EKG]: Secondary | ICD-10-CM | POA: Diagnosis not present

## 2024-04-21 DIAGNOSIS — Z79899 Other long term (current) drug therapy: Secondary | ICD-10-CM | POA: Diagnosis not present

## 2024-04-21 DIAGNOSIS — I5042 Chronic combined systolic (congestive) and diastolic (congestive) heart failure: Secondary | ICD-10-CM | POA: Insufficient documentation

## 2024-04-21 DIAGNOSIS — I4729 Other ventricular tachycardia: Secondary | ICD-10-CM

## 2024-04-21 DIAGNOSIS — R42 Dizziness and giddiness: Secondary | ICD-10-CM | POA: Insufficient documentation

## 2024-04-21 DIAGNOSIS — I639 Cerebral infarction, unspecified: Secondary | ICD-10-CM

## 2024-04-21 DIAGNOSIS — N1831 Chronic kidney disease, stage 3a: Secondary | ICD-10-CM | POA: Insufficient documentation

## 2024-04-21 DIAGNOSIS — I472 Ventricular tachycardia, unspecified: Secondary | ICD-10-CM | POA: Insufficient documentation

## 2024-04-21 DIAGNOSIS — N183 Chronic kidney disease, stage 3 unspecified: Secondary | ICD-10-CM | POA: Insufficient documentation

## 2024-04-21 DIAGNOSIS — E669 Obesity, unspecified: Secondary | ICD-10-CM | POA: Diagnosis not present

## 2024-04-21 DIAGNOSIS — Z7902 Long term (current) use of antithrombotics/antiplatelets: Secondary | ICD-10-CM | POA: Insufficient documentation

## 2024-04-21 DIAGNOSIS — E1122 Type 2 diabetes mellitus with diabetic chronic kidney disease: Secondary | ICD-10-CM | POA: Diagnosis not present

## 2024-04-21 DIAGNOSIS — I2510 Atherosclerotic heart disease of native coronary artery without angina pectoris: Secondary | ICD-10-CM | POA: Insufficient documentation

## 2024-04-21 DIAGNOSIS — I428 Other cardiomyopathies: Secondary | ICD-10-CM | POA: Insufficient documentation

## 2024-04-21 DIAGNOSIS — Z6832 Body mass index (BMI) 32.0-32.9, adult: Secondary | ICD-10-CM | POA: Insufficient documentation

## 2024-04-21 DIAGNOSIS — E1142 Type 2 diabetes mellitus with diabetic polyneuropathy: Secondary | ICD-10-CM | POA: Diagnosis not present

## 2024-04-21 DIAGNOSIS — Z7984 Long term (current) use of oral hypoglycemic drugs: Secondary | ICD-10-CM | POA: Diagnosis not present

## 2024-04-21 DIAGNOSIS — I5020 Unspecified systolic (congestive) heart failure: Secondary | ICD-10-CM | POA: Insufficient documentation

## 2024-04-21 DIAGNOSIS — I10 Essential (primary) hypertension: Secondary | ICD-10-CM | POA: Diagnosis not present

## 2024-04-21 DIAGNOSIS — E785 Hyperlipidemia, unspecified: Secondary | ICD-10-CM | POA: Diagnosis not present

## 2024-04-21 DIAGNOSIS — Z7952 Long term (current) use of systemic steroids: Secondary | ICD-10-CM | POA: Insufficient documentation

## 2024-04-21 DIAGNOSIS — Z8673 Personal history of transient ischemic attack (TIA), and cerebral infarction without residual deficits: Secondary | ICD-10-CM | POA: Insufficient documentation

## 2024-04-21 DIAGNOSIS — Z955 Presence of coronary angioplasty implant and graft: Secondary | ICD-10-CM | POA: Insufficient documentation

## 2024-04-21 LAB — HEMOGLOBIN A1C
Hgb A1c MFr Bld: 6.3 % — ABNORMAL HIGH (ref 4.8–5.6)
Mean Plasma Glucose: 134.11 mg/dL

## 2024-04-21 MED ORDER — FUROSEMIDE 20 MG PO TABS: 20.0000 mg | ORAL_TABLET | ORAL | Status: AC | PRN

## 2024-04-21 MED ORDER — HYDRALAZINE HCL 25 MG PO TABS: 25.0000 mg | ORAL_TABLET | Freq: Three times a day (TID) | ORAL | 3 refills | Status: AC

## 2024-04-21 NOTE — Addendum Note (Signed)
 Encounter addended by: Dante Jeannine HERO, CMA on: 04/21/2024 3:57 PM  Actions taken: Flowsheet accepted, Clinical Note Signed

## 2024-04-21 NOTE — Progress Notes (Signed)
"   ReDS Vest / Clip - 04/21/24 1500       ReDS Vest / Clip   Station Marker C    Ruler Value 28    ReDS Value Range Low volume    ReDS Actual Value 32          "

## 2024-04-21 NOTE — Patient Instructions (Addendum)
 RESTART Hydralazine  25 mg Three times a day  RESTART Lasix  20 mg daily for 7 days, then go as needed for weight gain of 3 lb in 24 hours or 5 lb in a week.  Labs done today, your results will be available in MyChart, we will contact you for abnormal readings.  REPEAT blood work in 2 weeks.  Your physician has requested that you have an echocardiogram. Echocardiography is a painless test that uses sound waves to create images of your heart. It provides your doctor with information about the size and shape of your heart and how well your hearts chambers and valves are working. This procedure takes approximately one hour. There are no restrictions for this procedure. Please do NOT wear cologne, perfume, aftershave, or lotions (deodorant is allowed). Please arrive 15 minutes prior to your appointment time.  Please note: We ask at that you not bring children with you during ultrasound (echo/ vascular) testing. Due to room size and safety concerns, children are not allowed in the ultrasound rooms during exams. Our front office staff cannot provide observation of children in our lobby area while testing is being conducted. An adult accompanying a patient to their appointment will only be allowed in the ultrasound room at the discretion of the ultrasound technician under special circumstances. We apologize for any inconvenience.  You have been referred to the HEART CARE PHARMACY TEAM. They will call you to arrange your appointment.  CHECK BLOOD PRESSURE DAILY AND LOG. PLEASE CALL THE OFFICE IF DIZZINESS GETS WORSE.  Your physician recommends that you schedule a follow-up appointment in: 4 months.  If you have any questions or concerns before your next appointment please send us  a message through [facility] or call our office at [phone].    TO LEAVE A MESSAGE FOR THE NURSE SELECT OPTION 2, PLEASE LEAVE A MESSAGE INCLUDING: YOUR NAME DATE OF BIRTH CALL BACK NUMBER REASON FOR CALL**this is  important as we prioritize the call backs  YOU WILL RECEIVE A CALL BACK THE SAME DAY AS LONG AS YOU CALL BEFORE 4:00 PM  At the [facility], you and your health needs are our priority. As part of our continuing mission to provide you with exceptional heart care, we have created designated Provider Care Teams. These Care Teams include your primary Cardiologist (physician) and Advanced Practice Providers (APPs- Physician Assistants and Nurse Practitioners) who all work together to provide you with the care you need, when you need it.   You may see any of the following providers on your designated Care Team at your next follow up: Dr Toribio Fuel Dr Ezra Shuck Dr. Morene Brownie Greig Mosses, NP Caffie Shed, [place] [facility] [place], [place] Beckey Coe, NP Jordan Lee, NP Ellouise Class, NP Tinnie Redman, PharmD Jaun Bash, PharmD   Please be sure to bring in all your medications bottles to every appointment.    Thank you for choosing [facility]

## 2024-04-24 ENCOUNTER — Ambulatory Visit (HOSPITAL_COMMUNITY): Payer: Self-pay | Admitting: Family Medicine

## 2024-04-24 ENCOUNTER — Ambulatory Visit (HOSPITAL_COMMUNITY): Admission: RE | Admit: 2024-04-24 | Discharge: 2024-04-24 | Attending: Cardiology

## 2024-04-24 DIAGNOSIS — I5020 Unspecified systolic (congestive) heart failure: Secondary | ICD-10-CM | POA: Insufficient documentation

## 2024-04-24 LAB — BASIC METABOLIC PANEL WITH GFR
Anion gap: 11 (ref 5–15)
BUN: 25 mg/dL — ABNORMAL HIGH (ref 8–23)
CO2: 28 mmol/L (ref 22–32)
Calcium: 9.9 mg/dL (ref 8.9–10.3)
Chloride: 102 mmol/L (ref 98–111)
Creatinine, Ser: 1.86 mg/dL — ABNORMAL HIGH (ref 0.61–1.24)
GFR, Estimated: 39 mL/min — ABNORMAL LOW
Glucose, Bld: 152 mg/dL — ABNORMAL HIGH (ref 70–99)
Potassium: 4.3 mmol/L (ref 3.5–5.1)
Sodium: 142 mmol/L (ref 135–145)

## 2024-04-24 LAB — PRO BRAIN NATRIURETIC PEPTIDE: Pro Brain Natriuretic Peptide: 62.1 pg/mL

## 2024-05-08 ENCOUNTER — Ambulatory Visit (HOSPITAL_COMMUNITY)

## 2024-05-10 ENCOUNTER — Ambulatory Visit: Admitting: Gastroenterology

## 2024-08-17 ENCOUNTER — Other Ambulatory Visit (HOSPITAL_COMMUNITY)

## 2024-08-17 ENCOUNTER — Ambulatory Visit (HOSPITAL_COMMUNITY): Admitting: Internal Medicine
